# Patient Record
Sex: Male | Born: 1945
Health system: Southern US, Community
[De-identification: ages and names within clinical notes are randomized; demographics above are authoritative.]

## PROBLEM LIST (undated history)

## (undated) DIAGNOSIS — E785 Hyperlipidemia, unspecified: Secondary | ICD-10-CM

## (undated) DIAGNOSIS — I2510 Atherosclerotic heart disease of native coronary artery without angina pectoris: Secondary | ICD-10-CM

## (undated) DIAGNOSIS — K219 Gastro-esophageal reflux disease without esophagitis: Secondary | ICD-10-CM

## (undated) DIAGNOSIS — I10 Essential (primary) hypertension: Secondary | ICD-10-CM

## (undated) HISTORY — PX: CORONARY ANGIOPLASTY: SHX604

## (undated) HISTORY — DX: Atherosclerotic heart disease of native coronary artery without angina pectoris: I25.10

## (undated) HISTORY — DX: Gastro-esophageal reflux disease without esophagitis: K21.9

## (undated) HISTORY — PX: TRANSURETHRAL RESECTION OF PROSTATE: SHX73

## (undated) HISTORY — DX: Hyperlipidemia, unspecified: E78.5

## (undated) HISTORY — DX: Essential (primary) hypertension: I10

---

## 2013-10-20 ENCOUNTER — Ambulatory Visit: Payer: Self-pay | Admitting: Interventional Cardiology

## 2013-10-21 ENCOUNTER — Encounter: Payer: Self-pay | Admitting: Cardiology

## 2013-10-21 ENCOUNTER — Encounter: Payer: Self-pay | Admitting: Interventional Cardiology

## 2013-10-21 ENCOUNTER — Ambulatory Visit (INDEPENDENT_AMBULATORY_CARE_PROVIDER_SITE_OTHER): Payer: BC Managed Care – PPO | Admitting: Interventional Cardiology

## 2013-10-21 VITALS — BP 154/68 | HR 64 | Ht 67.0 in | Wt 169.0 lb

## 2013-10-21 DIAGNOSIS — IMO0001 Reserved for inherently not codable concepts without codable children: Secondary | ICD-10-CM

## 2013-10-21 DIAGNOSIS — I10 Essential (primary) hypertension: Secondary | ICD-10-CM

## 2013-10-21 DIAGNOSIS — I2510 Atherosclerotic heart disease of native coronary artery without angina pectoris: Secondary | ICD-10-CM

## 2013-10-21 DIAGNOSIS — N184 Chronic kidney disease, stage 4 (severe): Secondary | ICD-10-CM | POA: Insufficient documentation

## 2013-10-21 DIAGNOSIS — N189 Chronic kidney disease, unspecified: Secondary | ICD-10-CM | POA: Insufficient documentation

## 2013-10-21 DIAGNOSIS — I25118 Atherosclerotic heart disease of native coronary artery with other forms of angina pectoris: Secondary | ICD-10-CM | POA: Insufficient documentation

## 2013-10-21 DIAGNOSIS — M791 Myalgia, unspecified site: Secondary | ICD-10-CM

## 2013-10-21 DIAGNOSIS — R252 Cramp and spasm: Secondary | ICD-10-CM

## 2013-10-21 DIAGNOSIS — I25119 Atherosclerotic heart disease of native coronary artery with unspecified angina pectoris: Secondary | ICD-10-CM | POA: Insufficient documentation

## 2013-10-21 MED ORDER — AMLODIPINE BESYLATE 10 MG PO TABS: ORAL_TABLET | ORAL | Status: DC

## 2013-10-21 MED ORDER — NITROGLYCERIN 0.4 MG SL SUBL: 0.4000 mg | SUBLINGUAL_TABLET | SUBLINGUAL | Status: DC | PRN

## 2013-10-21 MED ORDER — ATORVASTATIN CALCIUM 10 MG PO TABS: 10.0000 mg | ORAL_TABLET | Freq: Every day | ORAL | Status: DC

## 2013-10-21 MED ORDER — ASPIRIN EC 81 MG PO TBEC: 81.0000 mg | DELAYED_RELEASE_TABLET | Freq: Every day | ORAL | Status: DC

## 2013-10-21 MED ORDER — NEBIVOLOL HCL 5 MG PO TABS: 5.0000 mg | ORAL_TABLET | Freq: Every day | ORAL | Status: DC

## 2013-10-21 NOTE — Patient Instructions (Addendum)
Your physician has recommended you make the following change in your medication:   1. Start Aspirin 81 mg daily.   2. Start Atorvastatin 10 mg daily.   3. Stat Nitroglycerin 0.4 mg SL- only use as needed for chest pain.  4.  Increase Amlodipine to 10 mg daily.  Your physician recommends that you return for a FASTING lipid profile and vitamin D on 10/27/13.  Your physician has requested that you regularly monitor and record your blood pressure readings at home. Please use the same machine at the same time of day to check your readings and record them to bring to your follow-up visit. Call if BP are consistently above 150-140's/90's.  Your physician recommends that you schedule a follow-up appointment in: 2 months with Dr. Irish Lack.

## 2013-10-21 NOTE — Progress Notes (Signed)
Patient ID: Joshua Thompson, male   DOB: 08/07/1945, 68 y.o.   MRN: [id number]     Patient ID: Joshua Thompson MRN: [id number] DOB/AGE: 03/27/1946 68 y.o.   Referring Physician Dr. Cheral Bay   Reason for Consultation: HTN  HPI: 68 y/o who has had HTN for about 15 years.  In 2004, he had chest pain and then a stress test which was abnormal and then a DES (Taxus) was placed, while in [place].  He did well after that time.    In 2010, he had leg pain and kidney pain.  He was gaining weight and feeling ill and he was retaining fluid.  His BP was high.  His creatinine had reached 1.5+.  He also had prostatic obstruction leading to the urinary issues.   In 09/2012, he had HTN, urine retention and then he had a catheter placed for his bladder.  TURP was recommended, but it was also noted that his BP was up.  Another coronary lesion was found.  He was in renal failure, Cr 4.5.  He then had two bare stents placed.  Two weeks later, he had a TURP which was unsuccessful.  He had a bladder obstruction.  He had a repeat TURP.   In August 2014, he and his wife  Moved to [place].  He followed up with urology at [facility].  It was noted that there was an irregular surface of the inside of the bladder.  His Cr. Is 3.0.  Potassium was slightly elevated.    He has had gout also. His Hbg in around 13 at baseline.  He had a UTI and was treated with Antibiotics.  He moved to [place] since his family moved here.  No chest pain.  He reports some leg pain after walking long distances along with swelling.  He walks in the mall without problems. BP rarely goes below 140.     Current Outpatient Prescriptions  Medication Sig Dispense Refill  . amLODipine (NORVASC) 5 MG tablet Take 5 mg by mouth daily.      . nebivolol (BYSTOLIC) 5 MG tablet Take 5 mg by mouth daily.       No current facility-administered medications for this visit.   Past Medical History  Diagnosis Date  . Coronary artery disease   .  Hypertension     Family History  Problem Relation Age of Onset  . Hypertension Mother     History   Social History  . Marital Status: Married    Spouse Name: N/A    Number of Children: N/A  . Years of Education: N/A   Occupational History  . Not on file.   Social History Main Topics  . Smoking status: Never Smoker   . Smokeless tobacco: Not on file  . Alcohol Use: No  . Drug Use: Not on file  . Sexual Activity: Not on file   Other Topics Concern  . Not on file   Social History Narrative  . No narrative on file    Past Surgical History  Procedure Laterality Date  . Coronary angioplasty      3 STENTS  . Transurethral resection of prostate      HAD DONE THREE TIMES      (Not in a hospital admission)  Review of systems complete and found to be negative unless listed above .  No nausea, vomiting.  No fever chills, No focal weakness,  No palpitations.  Physical Exam: Filed Vitals:   10/21/13 0831  BP:  154/68  Pulse: 64    Weight: 169 lb (76.658 kg)  Physical exam:  [place]/AT EOMI No JVD, No carotid bruit RRR S1S2  No wheezing Soft. NT, nondistended No edema. No focal motor or sensory deficits Normal affect  Labs:   No results found for this basename: WBC, HGB, HCT, MCV, PLT   No results found for this basename: NA, K, CL, CO2, BUN, CREATININE, CALCIUM, LABALBU, PROT, BILITOT, ALKPHOS, ALT, AST, GLUCOSE,  in the last 168 hours No results found for this basename: CKTOTAL, CKMB, CKMBINDEX, TROPONINI    No results found for this basename: CHOL   No results found for this basename: HDL   No results found for this basename: LDLCALC   No results found for this basename: TRIG   No results found for this basename: CHOLHDL   No results found for this basename: LDLDIRECT      Radiology: EKG: Normal  ASSESSMENT AND PLAN:  CAD:  Several stents were then placed one was a Taxus and 2004. He has 2 bare metal stents from 2014. ReStart aspirin 81 mg daily.   Start atorvastatin 10 mg daily. He has had some leg cramps in the past. Consider co Q 10.  Check vitamin D level.  No angina. Given his kidney disease, I would be hesitant to perform angiography. This would only be done if absolutely necessary.  Hypertension:  Will increase amlodipine to 10 mg daily continue by systolic at 5 mg daily. He has brought  medications from [place].  When he runs out of these medicines, we will have to replace these with medicines from the [place]. He will check his blood pressures at home and keep a record.  Chronic kidney disease: He states his baseline creatinine is now about 3. Avoid nephrotoxins. No NSAIDs. Would not use an ACE inhibitor or ARB.  Signed:   Mina Marble, MD, [facility] 10/21/2013, 8:40 AM

## 2013-10-27 ENCOUNTER — Other Ambulatory Visit (INDEPENDENT_AMBULATORY_CARE_PROVIDER_SITE_OTHER): Payer: BC Managed Care – PPO

## 2013-10-27 DIAGNOSIS — R252 Cramp and spasm: Secondary | ICD-10-CM

## 2013-10-27 DIAGNOSIS — M791 Myalgia, unspecified site: Secondary | ICD-10-CM

## 2013-10-27 DIAGNOSIS — I2510 Atherosclerotic heart disease of native coronary artery without angina pectoris: Secondary | ICD-10-CM

## 2013-10-27 LAB — LIPID PANEL
CHOL/HDL RATIO: 6
Cholesterol: 204 mg/dL — ABNORMAL HIGH (ref 0–200)
HDL: 35.7 mg/dL — AB (ref >39.00)
LDL CALC: 120 mg/dL — AB (ref 0–99)
Triglycerides: 243 mg/dL — ABNORMAL HIGH (ref 0.0–149.0)
VLDL: 48.6 mg/dL — AB (ref 0.0–40.0)

## 2013-10-28 LAB — VITAMIN D 25 HYDROXY (VIT D DEFICIENCY, FRACTURES): Vit D, 25-Hydroxy: 22 ng/mL — ABNORMAL LOW (ref 30–89)

## 2013-10-28 NOTE — Progress Notes (Signed)
Quick Note:  Preliminary report reviewed by triage nurse and sent to MD desk. ______ 

## 2013-11-01 ENCOUNTER — Telehealth: Payer: Self-pay | Admitting: Cardiology

## 2013-11-01 DIAGNOSIS — I2510 Atherosclerotic heart disease of native coronary artery without angina pectoris: Secondary | ICD-10-CM

## 2013-11-01 DIAGNOSIS — IMO0001 Reserved for inherently not codable concepts without codable children: Secondary | ICD-10-CM

## 2013-11-01 DIAGNOSIS — R252 Cramp and spasm: Secondary | ICD-10-CM

## 2013-11-01 MED ORDER — ATORVASTATIN CALCIUM 20 MG PO TABS: 20.0000 mg | ORAL_TABLET | Freq: Every day | ORAL | Status: DC

## 2013-11-01 MED ORDER — VITAMIN D (ERGOCALCIFEROL) 1.25 MG (50000 UNIT) PO CAPS: 50000.0000 [IU] | ORAL_CAPSULE | ORAL | Status: DC

## 2013-11-01 NOTE — Telephone Encounter (Signed)
Pt notified, meds updated and labs ordered.  

## 2013-11-01 NOTE — Telephone Encounter (Signed)
Message copied by Alcario Drought on Mon Nov 01, 2013  1:51 PM ------      Message from: SMART, [place]: Mon Nov 01, 2013  9:48 AM       RF:  CAD (3 stents) - 2 stents placed in 2014, 1 stent in 2004, HTN, age - LDL goal < 70, non-HDL goal < 100      Meds:  Lipitor 10 mg qd recently started by Dr. Irish Lack this month.      Patient has low vitamin D and elevated LDL.  Will given him vitamin D replacement therapy now, and increase atorvastatin dose next month.      Plan:      1.  Start Vitamin D 50,000 units once weekly (given #4 with 1 refill)      2.  Increase atorvastatin to 20 mg next month when current supply of 10 mg runs out.      3.  Recheck lipid panel, hepatic panel, and vitamin D 25-OH level in 8 weeks.      Please notify patient, update meds, and set up labs. Thanks.       ------

## 2013-12-07 ENCOUNTER — Telehealth: Payer: Self-pay | Admitting: Interventional Cardiology

## 2013-12-07 ENCOUNTER — Telehealth: Payer: Self-pay

## 2013-12-07 NOTE — Telephone Encounter (Signed)
Pt was given list by doctor to find out which medication works for his insurance for the [place] was giving problems to patient. Pt came back and would like to choose Carvedilol. Pt is out completley out of medication. Gave some samples but won't last long. Please advise.

## 2013-12-07 NOTE — Telephone Encounter (Signed)
Ok to use carvedilol 6.25 mg BID.  WIll titrate up as needed.

## 2013-12-07 NOTE — Telephone Encounter (Signed)
To Dr. Varanasi, please advise.  

## 2013-12-07 NOTE — Telephone Encounter (Signed)
Ok to switch 

## 2013-12-07 NOTE — Telephone Encounter (Signed)
Noted, see other telephone note.  

## 2013-12-07 NOTE — Telephone Encounter (Signed)
Patient came to office to pick up samples of bystolic 5 mg, gave them to him up front

## 2013-12-08 MED ORDER — CARVEDILOL 6.25 MG PO TABS
6.2500 mg | ORAL_TABLET | Freq: Two times a day (BID) | ORAL | Status: DC
Start: 2013-12-08 — End: 2013-12-20

## 2013-12-08 NOTE — Telephone Encounter (Signed)
Pt notified. Coreg sent in. Bystolic taken off med list.

## 2013-12-20 ENCOUNTER — Ambulatory Visit (INDEPENDENT_AMBULATORY_CARE_PROVIDER_SITE_OTHER): Payer: BC Managed Care – PPO | Admitting: Interventional Cardiology

## 2013-12-20 ENCOUNTER — Encounter: Payer: Self-pay | Admitting: Interventional Cardiology

## 2013-12-20 ENCOUNTER — Telehealth: Payer: Self-pay | Admitting: Interventional Cardiology

## 2013-12-20 VITALS — BP 122/58 | HR 64 | Ht 67.0 in | Wt 168.0 lb

## 2013-12-20 DIAGNOSIS — I10 Essential (primary) hypertension: Secondary | ICD-10-CM

## 2013-12-20 DIAGNOSIS — N189 Chronic kidney disease, unspecified: Secondary | ICD-10-CM

## 2013-12-20 MED ORDER — CARVEDILOL 6.25 MG PO TABS: 6.2500 mg | ORAL_TABLET | Freq: Two times a day (BID) | ORAL | Status: DC

## 2013-12-20 NOTE — Telephone Encounter (Signed)
Spoke with pt at [facility] today and he was switched from [place] to [place]. Form will not be needed.

## 2013-12-20 NOTE — Patient Instructions (Signed)
Your physician recommends that you continue on your current medications as directed. Please refer to the Current Medication list given to you today.  Your physician wants you to follow-up in: 6 months with Dr. Varanasi.  You will receive a reminder letter in the mail two months in advance. If you don't receive a letter, please call our office to schedule the follow-up appointment.  

## 2013-12-20 NOTE — Progress Notes (Signed)
Patient ID: Joshua Thompson, male   DOB: August 05, 1945, 68 y.o.   MRN: [id number] Patient ID: Joshua Thompson, male   DOB: Dec 09, 1945, 68 y.o.   MRN: [id number]     Patient ID: Joshua Thompson MRN: [id number] DOB/AGE: [birth year] 28 y.o.   Referring Physician Dr. Christy Sartorius   Reason for Consultation: HTN  HPI: 68 y/o who has had HTN for about 15 years.  In 2004, he had chest pain and then a stress test which was abnormal and then a DES (Taxus) was placed, while in [place].  He did well after that time.    In 2010, he had leg pain and kidney pain.  He was gaining weight and feeling ill and he was retaining fluid.  His BP was high.  His creatinine had reached 1.5+.  He also had prostatic obstruction leading to the urinary issues.   In 09/2012, he had HTN, urine retention and then he had a catheter placed for his bladder.  TURP was recommended, but it was also noted that his BP was up.  Another coronary lesion was found.  He was in renal failure, Cr 4.5.  He then had two bare stents placed.  Two weeks later, he had a TURP which was unsuccessful.  He had a bladder obstruction.  He had a repeat TURP.   In August 2014, he and his wife  Moved to [place].  He followed up with urology at [facility].  It was noted that there was an irregularity of the surface of the inside of the bladder.  His Cr. Is 3.0.  Potassium was slightly elevated.    He has had gout also. His Hbg in around 13 at baseline.  He had a UTI and was treated with Antibiotics.  He moved to [place] since his family moved here.  No chest pain.  He reports some leg pain after walking long distances along with swelling.  He walks in the mall without problems. BP now typically in the 130s since increasing Norvasc at last visit.  Only taking Coreg once a day.   Current Outpatient Prescriptions  Medication Sig Dispense Refill  . amLODipine (NORVASC) 10 MG tablet 1 table daily.  30 tablet  6  . aspirin EC 81 MG tablet Take 1 tablet (81  mg total) by mouth daily.      Marland Kitchen atorvastatin (LIPITOR) 20 MG tablet Take 1 tablet (20 mg total) by mouth daily.  30 tablet  6  . carvedilol (COREG) 6.25 MG tablet Take 1 tablet (6.25 mg total) by mouth 2 (two) times daily.  60 tablet  6  . nitroGLYCERIN (NITROSTAT) 0.4 MG SL tablet Place 1 tablet (0.4 mg total) under the tongue every 5 (five) minutes as needed for chest pain.  25 tablet  5  . Vitamin D, Ergocalciferol, (DRISDOL) 50000 UNITS CAPS capsule Take 1 capsule (50,000 Units total) by mouth every 7 (seven) days.  4 capsule  1   No current facility-administered medications for this visit.   Past Medical History  Diagnosis Date  . Coronary artery disease   . Hypertension     Family History  Problem Relation Age of Onset  . Hypertension Mother     History   Social History  . Marital Status: Married    Spouse Name: N/A    Number of Children: N/A  . Years of Education: N/A   Occupational History  . Not on file.   Social History Main Topics  . Smoking status: Never Smoker   .  Smokeless tobacco: Not on file  . Alcohol Use: No  . Drug Use: Not on file  . Sexual Activity: Not on file   Other Topics Concern  . Not on file   Social History Narrative  . No narrative on file    Past Surgical History  Procedure Laterality Date  . Coronary angioplasty      3 STENTS  . Transurethral resection of prostate      HAD DONE THREE TIMES      (Not in a hospital admission)  Review of systems complete and found to be negative unless listed above .  No nausea, vomiting.  No fever chills, No focal weakness,  No palpitations.  Physical Exam: Filed Vitals:   12/20/13 0839  BP: 122/58  Pulse: 64    Weight: 168 lb (76.204 kg)  Physical exam:  [place]/AT EOMI No JVD, No carotid bruit RRR S1S2  No wheezing Soft. NT, nondistended No edema. No focal motor or sensory deficits Normal affect  Labs:   No results found for this basename: WBC,  HGB,  HCT,  MCV,  PLT   No results  found for this basename: NA, K, CL, CO2, BUN, CREATININE, CALCIUM, LABALBU, PROT, BILITOT, ALKPHOS, ALT, AST, GLUCOSE,  in the last 168 hours No results found for this basename: CKTOTAL,  CKMB,  CKMBINDEX,  TROPONINI    Lab Results  Component Value Date   CHOL 204* 10/27/2013   Lab Results  Component Value Date   HDL 35.70* 10/27/2013   Lab Results  Component Value Date   LDLCALC 120* 10/27/2013   Lab Results  Component Value Date   TRIG 243.0* 10/27/2013   Lab Results  Component Value Date   CHOLHDL 6 10/27/2013   No results found for this basename: LDLDIRECT      Radiology: EKG: Normal  ASSESSMENT AND PLAN:  CAD:  Several stents were then placed one was a Taxus and 2004. He has 2 bare metal stents from 2014. ReStarted aspirin 81 mg daily.  Started atorvastatin 10 mg daily. He has had some leg cramps in the past. Consider co Q 10.  Check vitamin D level.  No angina. Given his kidney disease, I would be hesitant to perform angiography. This would only be done if absolutely necessary.  Hypertension:  Will increase amlodipine to 10 mg daily continue by systolic at 5 mg daily. He has brought  medications from [place].  When he runs out of these medicines, we will have to replace these with medicines from the [place]. He will check his blood pressures at home and keep a record. Home BP is typically in the [id number] range systolic.  Coreg increased to BID.  Would like to see systolics below [id number].  Chronic kidney disease: He states his baseline creatinine is now about 3. Avoid nephrotoxins. No NSAIDs. Would not use an ACE inhibitor or ARB.  Ear fullness may be related to seasonal allergies.  He is fairly new to the area.   Signed:   Mina Marble, MD, [facility] 12/20/2013, 8:54 AM

## 2013-12-20 NOTE — Telephone Encounter (Signed)
Pt is wanting to know about an application he filled out to help with medication.

## 2014-01-03 ENCOUNTER — Other Ambulatory Visit (INDEPENDENT_AMBULATORY_CARE_PROVIDER_SITE_OTHER): Payer: BC Managed Care – PPO

## 2014-01-03 DIAGNOSIS — IMO0001 Reserved for inherently not codable concepts without codable children: Secondary | ICD-10-CM

## 2014-01-03 DIAGNOSIS — R252 Cramp and spasm: Secondary | ICD-10-CM

## 2014-01-03 DIAGNOSIS — I2510 Atherosclerotic heart disease of native coronary artery without angina pectoris: Secondary | ICD-10-CM

## 2014-01-03 LAB — LIPID PANEL
CHOLESTEROL: 113 mg/dL (ref 0–200)
HDL: 39.6 mg/dL (ref >39.00)
LDL CALC: 52 mg/dL (ref 0–99)
NonHDL: 73.4
Total CHOL/HDL Ratio: 3
Triglycerides: 106 mg/dL (ref 0.0–149.0)
VLDL: 21.2 mg/dL (ref 0.0–40.0)

## 2014-01-03 LAB — HEPATIC FUNCTION PANEL
ALK PHOS: 65 U/L (ref 39–117)
ALT: 19 U/L (ref 0–53)
AST: 22 U/L (ref 0–37)
Albumin: 4 g/dL (ref 3.5–5.2)
BILIRUBIN DIRECT: 0.1 mg/dL (ref 0.0–0.3)
BILIRUBIN TOTAL: 0.9 mg/dL (ref 0.2–1.2)
Total Protein: 7 g/dL (ref 6.0–8.3)

## 2014-01-04 LAB — VITAMIN D 25 HYDROXY (VIT D DEFICIENCY, FRACTURES): Vit D, 25-Hydroxy: 44 ng/mL (ref 30–89)

## 2014-01-07 ENCOUNTER — Other Ambulatory Visit: Payer: Self-pay | Admitting: Cardiology

## 2014-01-07 DIAGNOSIS — I2510 Atherosclerotic heart disease of native coronary artery without angina pectoris: Secondary | ICD-10-CM

## 2014-05-24 ENCOUNTER — Other Ambulatory Visit: Payer: Self-pay | Admitting: *Deleted

## 2014-05-24 MED ORDER — AMLODIPINE BESYLATE 10 MG PO TABS: 10.0000 mg | ORAL_TABLET | Freq: Every day | ORAL | Status: DC

## 2014-06-13 ENCOUNTER — Other Ambulatory Visit: Payer: BC Managed Care – PPO

## 2014-06-15 ENCOUNTER — Other Ambulatory Visit (INDEPENDENT_AMBULATORY_CARE_PROVIDER_SITE_OTHER): Payer: BC Managed Care – PPO | Admitting: *Deleted

## 2014-06-15 DIAGNOSIS — I2510 Atherosclerotic heart disease of native coronary artery without angina pectoris: Secondary | ICD-10-CM

## 2014-06-15 LAB — HEPATIC FUNCTION PANEL
ALT: 21 U/L (ref 0–53)
AST: 21 U/L (ref 0–37)
Albumin: 4.1 g/dL (ref 3.5–5.2)
Alkaline Phosphatase: 75 U/L (ref 39–117)
Bilirubin, Direct: 0.1 mg/dL (ref 0.0–0.3)
Total Bilirubin: 0.6 mg/dL (ref 0.2–1.2)
Total Protein: 7.2 g/dL (ref 6.0–8.3)

## 2014-06-15 LAB — LIPID PANEL
CHOL/HDL RATIO: 3
Cholesterol: 149 mg/dL (ref 0–200)
HDL: 49.9 mg/dL (ref >39.00)
LDL CALC: 85 mg/dL (ref 0–99)
NonHDL: 99.1
Triglycerides: 73 mg/dL (ref 0.0–149.0)
VLDL: 14.6 mg/dL (ref 0.0–40.0)

## 2014-07-06 ENCOUNTER — Ambulatory Visit (INDEPENDENT_AMBULATORY_CARE_PROVIDER_SITE_OTHER): Payer: 59 | Admitting: Interventional Cardiology

## 2014-07-06 ENCOUNTER — Encounter: Payer: Self-pay | Admitting: Interventional Cardiology

## 2014-07-06 VITALS — BP 146/88 | HR 88 | Ht 67.0 in | Wt 164.0 lb

## 2014-07-06 DIAGNOSIS — N183 Chronic kidney disease, stage 3 unspecified: Secondary | ICD-10-CM

## 2014-07-06 DIAGNOSIS — I2510 Atherosclerotic heart disease of native coronary artery without angina pectoris: Secondary | ICD-10-CM

## 2014-07-06 DIAGNOSIS — I10 Essential (primary) hypertension: Secondary | ICD-10-CM

## 2014-07-06 MED ORDER — CARVEDILOL 12.5 MG PO TABS: 12.5000 mg | ORAL_TABLET | Freq: Two times a day (BID) | ORAL | Status: DC

## 2014-07-06 NOTE — Progress Notes (Signed)
Patient ID: Joshua Thompson, male   DOB: 1945/10/13, 69 y.o.   MRN: [id number]     Patient ID: Joshua Thompson MRN: [id number] DOB/AGE: 1945-06-21 69 y.o.   Referring Physician Dr. Christy Sartorius   Reason for Consultation: HTN  HPI: 69 y/o who has had HTN for about 15 years.  In 2004, he had chest pain and then a stress test which was abnormal and then a DES (Taxus) was placed, while in [place].  He did well after that time.    In 2010, he had leg pain and kidney pain.  He was gaining weight and feeling ill and he was retaining fluid.  His BP was high.  His creatinine had reached 1.5+.  He also had prostatic obstruction leading to the urinary issues.   In 09/2012, he had HTN, urine retention and then he had a catheter placed for his bladder.  TURP was recommended, but it was also noted that his BP was up.  Another coronary lesion was found.  He was in renal failure, Cr 4.5.  He then had two bare stents placed.  Two weeks later, he had a TURP which was unsuccessful.  He had a bladder obstruction.  He had a repeat TURP.   In August 2014, he and his wife  Moved to [place].  He followed up with urology at [facility].  It was noted that there was an irregularity of the surface of the inside of the bladder.  His Cr. Is 3.0.  Potassium was slightly elevated.    He has had gout also. His Hbg in around 13 at baseline.    BP has been in the A999333 systolic range. This is high for him. He has had some mild chest discomfort. This is worse with palpation. He feels that his musculoskeletal. He thinks it is different from his prior angina. He is not requesting any further evaluation of this at this time.  He also mentions that his wife's blood pressure is been high. He has a son who has hypertrophic obstructive cardiomyopathy. He is being evaluated by EP for possible defibrillator.   Current Outpatient Prescriptions  Medication Sig Dispense Refill  . amLODipine (NORVASC) 10 MG tablet Take 1 tablet  (10 mg total) by mouth daily. 30 tablet 1  . aspirin EC 81 MG tablet Take 1 tablet (81 mg total) by mouth daily.    Marland Kitchen atorvastatin (LIPITOR) 20 MG tablet Take 1 tablet (20 mg total) by mouth daily. 30 tablet 6  . carvedilol (COREG) 6.25 MG tablet Take 1 tablet (6.25 mg total) by mouth 2 (two) times daily. 60 tablet 6  . nitroGLYCERIN (NITROSTAT) 0.4 MG SL tablet Place 1 tablet (0.4 mg total) under the tongue every 5 (five) minutes as needed for chest pain. 25 tablet 5   No current facility-administered medications for this visit.   Past Medical History  Diagnosis Date  . Coronary artery disease   . Hypertension     Family History  Problem Relation Age of Onset  . Hypertension Mother     History   Social History  . Marital Status: Married    Spouse Name: N/A    Number of Children: N/A  . Years of Education: N/A   Occupational History  . Not on file.   Social History Main Topics  . Smoking status: Never Smoker   . Smokeless tobacco: Not on file  . Alcohol Use: No  . Drug Use: Not on file  . Sexual Activity: Not on file  Other Topics Concern  . Not on file   Social History Narrative    Past Surgical History  Procedure Laterality Date  . Coronary angioplasty      3 STENTS  . Transurethral resection of prostate      HAD DONE THREE TIMES      (Not in a hospital admission)  Review of systems complete and found to be negative unless listed above .  No nausea, vomiting.  No fever chills, No focal weakness,  No palpitations.  Physical Exam: Filed Vitals:   07/06/14 1513  BP: 146/88  Pulse: 88    Weight: 164 lb (74.39 kg)  Physical exam: No apparent distress [place]/AT EOMI No JVD, No carotid bruit RRR S1S2  No wheezing Soft. NT, nondistended No edema. No focal motor or sensory deficits Normal affect  Labs:   No results found for: WBC No results for input(s): NA, K, CL, CO2, BUN, CREATININE, CALCIUM, PROT, BILITOT, ALKPHOS, ALT, AST, GLUCOSE in the last 168  hours.  Invalid input(s): LABALBU No results found for: CKTOTAL  Lab Results  Component Value Date   CHOL 149 06/15/2014   CHOL 113 01/03/2014   CHOL 204* 10/27/2013   Lab Results  Component Value Date   HDL 49.90 06/15/2014   HDL 39.60 01/03/2014   HDL 35.70* 10/27/2013   Lab Results  Component Value Date   LDLCALC 85 06/15/2014   LDLCALC 52 01/03/2014   LDLCALC 120* 10/27/2013   Lab Results  Component Value Date   TRIG 73.0 06/15/2014   TRIG 106.0 01/03/2014   TRIG 243.0* 10/27/2013   Lab Results  Component Value Date   CHOLHDL 3 06/15/2014   CHOLHDL 3 01/03/2014   CHOLHDL 6 10/27/2013   No results found for: LDLDIRECT    Radiology:   ASSESSMENT AND PLAN:  CAD/chest pain:  Several stents were then placed one was a Taxus and 2004. He has 2 bare metal stents from 2014. ReStarted aspirin 81 mg daily.  Started atorvastatin 10 mg daily. He has had some leg cramps in the past. Consider co Q 10.  Check vitamin D level.  Not like his prior angina. Given his kidney disease, I would be hesitant to perform angiography. This would only be done if absolutely necessary.  Pain in the chest reproducible with palpation of the chest.  I think this is chest wall pain.  THis is different from his prior angina.  Hypertension: Increased amlodipine to 10 mg daily.  He will check his blood pressures at home and keep a record. Home BP is typically in the A999333 range systolic.   Increase Coreg to 12.5 BID.  Would like to see systolics below [id number].  Pharmacy also spoke to the patient regarding enrolling in a hypertension project to see if monitoring his blood pressure with phone calls and adjustments would be helpful.  Chronic kidney disease: He states his baseline creatinine is now about 3. Avoid nephrotoxins. No NSAIDs. Would not use an ACE inhibitor or ARB.  Want to keep strict control of his BP to preserve renal function.     Signed:   Mina Marble, MD, [facility] 07/06/2014, 3:42 PM

## 2014-07-06 NOTE — Patient Instructions (Signed)
Your physician has recommended you make the following change in your medication:  1) START taking Carvedilol 12.5mg  twice daily  Your physician recommends that you schedule a follow-up appointment in: 2 months with Dr. Irish Lack.

## 2014-07-12 ENCOUNTER — Ambulatory Visit: Payer: 59 | Admitting: Pharmacist

## 2014-07-14 ENCOUNTER — Encounter: Payer: Self-pay | Admitting: Pharmacist Clinician (PhC)/ Clinical Pharmacy Specialist

## 2014-07-14 ENCOUNTER — Ambulatory Visit (INDEPENDENT_AMBULATORY_CARE_PROVIDER_SITE_OTHER): Payer: 59 | Admitting: Pharmacist Clinician (PhC)/ Clinical Pharmacy Specialist

## 2014-07-14 VITALS — BP 152/84 | Ht 67.0 in | Wt 164.0 lb

## 2014-07-14 DIAGNOSIS — I10 Essential (primary) hypertension: Secondary | ICD-10-CM

## 2014-07-14 LAB — BASIC METABOLIC PANEL
BUN: 36 mg/dL — AB (ref 6–23)
CALCIUM: 9.3 mg/dL (ref 8.4–10.5)
CHLORIDE: 107 meq/L (ref 96–112)
CO2: 22 mEq/L (ref 19–32)
Creatinine, Ser: 3.01 mg/dL — ABNORMAL HIGH (ref 0.40–1.50)
GFR: 22.21 mL/min — ABNORMAL LOW (ref >60.00)
GLUCOSE: 85 mg/dL (ref 70–99)
Potassium: 5.8 mEq/L — ABNORMAL HIGH (ref 3.5–5.1)
Sodium: 138 mEq/L (ref 135–145)

## 2014-07-14 MED ORDER — HYDRALAZINE HCL 25 MG PO TABS: 25.0000 mg | ORAL_TABLET | Freq: Two times a day (BID) | ORAL | Status: DC

## 2014-07-14 NOTE — Progress Notes (Signed)
Pharmacist Hypertension Clinic - Resident Research Study  S/O: Joshua Thompson is a 69 y.o. male presenting to clinic today for initial study visit and referred to hypertension clinic by Dr. Irish Lack.  Pt does have history of kidney disease.  He has been taking amlodipine since before coming to [facility] and it was increased from 5 mg daily to 10 mg daily in May 2015.  He has had no complaints or problems with this.  At his most recent visit with Dr. Irish Lack his carvedilol was doubled from 6.25 to 12.5 mg twice daily.  He reports no problems with compliance or side effects from the medications  Current HTN meds: Amlodipine 10 mg daily Carvedilol 12.5 mg twice daily  NOTE : AVOID ACEI and ARB due to kidney disease  FH:  Mother had hypertension, son has hypertrophic obstructive cardiomyopath  SH:  Does not drink alcohol or use tobacco  Wt Readings from Last 3 Encounters:  07/14/14 164 lb (74.39 kg)  07/06/14 164 lb (74.39 kg)  12/20/13 168 lb (76.204 kg)   BP Readings from Last 3 Encounters:  07/14/14 152/84  07/06/14 146/88  12/20/13 122/58   Pulse Readings from Last 3 Encounters:  07/06/14 88  12/20/13 64  10/21/13 64    Renal function: CrCl cannot be calculated (Patient has no serum creatinine result on file.).  Outpatient Encounter Prescriptions as of 07/14/2014  Medication Sig  . amLODipine (NORVASC) 10 MG tablet Take 1 tablet (10 mg total) by mouth daily.  Marland Kitchen aspirin EC 81 MG tablet Take 1 tablet (81 mg total) by mouth daily.  Marland Kitchen atorvastatin (LIPITOR) 20 MG tablet Take 1 tablet (20 mg total) by mouth daily.  . carvedilol (COREG) 12.5 MG tablet Take 1 tablet (12.5 mg total) by mouth 2 (two) times daily.  . nitroGLYCERIN (NITROSTAT) 0.4 MG SL tablet Place 1 tablet (0.4 mg total) under the tongue every 5 (five) minutes as needed for chest pain.    Allergies: Allergies  Allergen Reactions  . Ampicillin Rash    A/P: Pt has qualified for study enrollment and has  signed informed consent. Pt was randomized to study group: telephonic visits  Pt is not at BP goal of < 130/90 mmHg. (MD preferred goal).  Will add hydralazine 25 mg twice daily.   Pt knows to call if develops any side effects/problems with the medication.

## 2014-07-15 ENCOUNTER — Telehealth: Payer: Self-pay | Admitting: *Deleted

## 2014-07-15 DIAGNOSIS — E875 Hyperkalemia: Secondary | ICD-10-CM

## 2014-07-15 DIAGNOSIS — N183 Chronic kidney disease, stage 3 unspecified: Secondary | ICD-10-CM

## 2014-07-15 NOTE — Telephone Encounter (Signed)
-----   Message from Jettie Booze, MD sent at 07/14/2014  5:54 PM EST ----- Cr at baseline.  Potassium is high.  Avoid potassium rich foods.  Drink plenty of water and recheck in a week.  WOuld forward this result to his nephrologist.

## 2014-07-15 NOTE — Telephone Encounter (Signed)
Spoke with pt about labs and recommendations per Dr. Irish Lack. Informed pt to avoid K+ rich foods and to drink plenty of water. Labs scheduled for 07/21/14. Pt verbalized understanding and in agreement with this plan.

## 2014-07-21 ENCOUNTER — Other Ambulatory Visit (INDEPENDENT_AMBULATORY_CARE_PROVIDER_SITE_OTHER): Payer: 59 | Admitting: *Deleted

## 2014-07-21 DIAGNOSIS — N183 Chronic kidney disease, stage 3 unspecified: Secondary | ICD-10-CM

## 2014-07-21 DIAGNOSIS — E875 Hyperkalemia: Secondary | ICD-10-CM

## 2014-07-21 LAB — BASIC METABOLIC PANEL
BUN: 38 mg/dL — AB (ref 6–23)
CHLORIDE: 109 meq/L (ref 96–112)
CO2: 23 mEq/L (ref 19–32)
Calcium: 9.1 mg/dL (ref 8.4–10.5)
Creatinine, Ser: 3.11 mg/dL — ABNORMAL HIGH (ref 0.40–1.50)
GFR: 21.39 mL/min — ABNORMAL LOW (ref >60.00)
Glucose, Bld: 90 mg/dL (ref 70–99)
POTASSIUM: 5.2 meq/L — AB (ref 3.5–5.1)
Sodium: 138 mEq/L (ref 135–145)

## 2014-07-21 NOTE — Addendum Note (Signed)
Addended by: Eulis Foster on: 07/21/2014 09:18 AM   Modules accepted: Orders

## 2014-08-10 ENCOUNTER — Other Ambulatory Visit: Payer: Self-pay

## 2014-08-10 MED ORDER — AMLODIPINE BESYLATE 10 MG PO TABS: 10.0000 mg | ORAL_TABLET | Freq: Every day | ORAL | Status: DC

## 2014-08-12 ENCOUNTER — Telehealth: Payer: Self-pay | Admitting: Pharmacist

## 2014-08-12 DIAGNOSIS — I10 Essential (primary) hypertension: Secondary | ICD-10-CM

## 2014-08-12 MED ORDER — HYDRALAZINE HCL 25 MG PO TABS: 25.0000 mg | ORAL_TABLET | Freq: Three times a day (TID) | ORAL | Status: DC

## 2014-08-12 NOTE — Telephone Encounter (Addendum)
Pharmacist Hypertension Clinic - Resident Research Study  S/O: Joshua Thompson is a 69 y.o. male.  Calling pt today for 4wk HTN study f/u visit, referred to hypertension clinic by Dr. Irish Lack.  Pt has history of kidney disease.  He has been taking amlodipine since before coming to [facility] and it was increased from 5 mg daily to 10 mg daily in May 2015.  He has had no complaints or problems with this.  His carvedilol was doubled from 6.25mg  to 12.5mg  twice daily in Jan 2016.  He was prescribed hydralazine 25mg  twice daily in Jan 2016 (1 month ago), but he was just able to fill it yesterday and began taking this morning.  He reports no problems with compliance or side effects from the medications.  Current HTN meds: Amlodipine 10mg  daily Carvedilol 12.5mg  twice daily Hydralazine 25mg  twice daily (just started taking this morning)  NOTE: AVOID ACEi/ARB due to kidney disease documented in chart, but ACEi/ARB is actually INDICATED in patients w/ CKD  FH:  Mother had hypertension, son has hypertrophic obstructive cardiomyopath  SH:  Does not drink alcohol or use tobacco  Wt Readings from Last 3 Encounters:  07/14/14 164 lb (74.39 kg)  07/06/14 164 lb (74.39 kg)  12/20/13 168 lb (76.204 kg)   BP Readings from Last 3 Encounters:  08/12/14 165/84  07/14/14 152/84  07/06/14 146/88   Pulse Readings from Last 3 Encounters:  07/06/14 88  12/20/13 64  10/21/13 64    Renal function: CrCl ~25 mL/min, K+ remains elevated 5.8>>5.2 (pt has nephrologist who is monitoring and addressing)   Outpatient Encounter Prescriptions as of 08/12/2014  Medication Sig  . amLODipine (NORVASC) 10 MG tablet Take 1 tablet (10 mg total) by mouth daily.  Marland Kitchen aspirin EC 81 MG tablet Take 1 tablet (81 mg total) by mouth daily.  Marland Kitchen atorvastatin (LIPITOR) 20 MG tablet Take 1 tablet (20 mg total) by mouth daily.  . carvedilol (COREG) 12.5 MG tablet Take 1 tablet (12.5 mg total) by mouth 2 (two) times daily.  .  hydrALAZINE (APRESOLINE) 25 MG tablet Take 1 tablet (25 mg total) by mouth 3 (three) times daily.  . nitroGLYCERIN (NITROSTAT) 0.4 MG SL tablet Place 1 tablet (0.4 mg total) under the tongue every 5 (five) minutes as needed for chest pain.  . [DISCONTINUED] hydrALAZINE (APRESOLINE) 25 MG tablet Take 1 tablet (25 mg total) by mouth 2 (two) times daily.    Allergies: Allergies  Allergen Reactions  . Ampicillin Rash    A/P: Pt has qualified for study enrollment and has signed informed consent. Pt was randomized to study group: Home BP monitoring w/ telephone f/u  Pt is not at BP goal of < 130/90 mmHg (MD preferred goal).  Will change hydralazine 25mg  from BID to TID.   Pt knows to call if develops any side effects/problems with the medication.  Drucie Opitz, PharmD Clinical Pharmacy Resident

## 2014-09-07 ENCOUNTER — Other Ambulatory Visit: Payer: Self-pay | Admitting: *Deleted

## 2014-09-07 MED ORDER — ATORVASTATIN CALCIUM 20 MG PO TABS: 20.0000 mg | ORAL_TABLET | Freq: Every day | ORAL | Status: DC

## 2014-09-14 ENCOUNTER — Other Ambulatory Visit: Payer: Self-pay | Admitting: *Deleted

## 2014-09-14 ENCOUNTER — Encounter: Payer: Self-pay | Admitting: Interventional Cardiology

## 2014-09-14 ENCOUNTER — Ambulatory Visit (INDEPENDENT_AMBULATORY_CARE_PROVIDER_SITE_OTHER): Payer: 59 | Admitting: Interventional Cardiology

## 2014-09-14 VITALS — BP 134/72 | HR 65 | Ht 66.0 in | Wt 163.0 lb

## 2014-09-14 DIAGNOSIS — N183 Chronic kidney disease, stage 3 unspecified: Secondary | ICD-10-CM

## 2014-09-14 DIAGNOSIS — I2510 Atherosclerotic heart disease of native coronary artery without angina pectoris: Secondary | ICD-10-CM

## 2014-09-14 DIAGNOSIS — I10 Essential (primary) hypertension: Secondary | ICD-10-CM | POA: Diagnosis not present

## 2014-09-14 MED ORDER — HYDRALAZINE HCL 25 MG PO TABS: 25.0000 mg | ORAL_TABLET | Freq: Three times a day (TID) | ORAL | Status: DC

## 2014-09-14 NOTE — Patient Instructions (Signed)
**Note De-Identified Joshua Thompson Obfuscation** Your physician recommends that you continue on your current medications as directed. Please refer to the Current Medication list given to you today.  Your physician recommends that you schedule a follow-up appointment in: to be determined by Dr Irish Lack .

## 2014-09-14 NOTE — Progress Notes (Signed)
Patient ID: Joshua Thompson, male   DOB: Mar 24, 1946, 69 y.o.   MRN: [id number]     Patient ID: Joshua Thompson MRN: [id number] DOB/AGE: Dec 05, 1945 69 y.o.   Referring Physician Dr. Ashby Dawes   Reason for Consultation: HTN  HPI: 69 y/o who has had HTN for about 15 years.  In 2004, he had chest pain and then a stress test which was abnormal and then a DES (Taxus) was placed, while in [place].  He did well after that time.    In 2010, he had leg pain and kidney pain.  He was gaining weight and feeling ill and he was retaining fluid.  His BP was high.  His creatinine had reached 1.5+.  He also had prostatic obstruction leading to the urinary issues.   In 09/2012, he had HTN, urine retention and then he had a catheter placed for his bladder.  TURP was recommended, but it was also noted that his BP was up.  Another coronary lesion was found.  He was in renal failure, Cr 4.5.  He then had two bare stents placed.  Two weeks later, he had a TURP which was unsuccessful.  He had a bladder obstruction.  He had a repeat TURP.   In August 2014, he and his wife  Moved to [place].  He followed up with urology at [facility].  It was noted that there was an irregularity of the surface of the inside of the bladder.  His Cr. Is 3.0.  Potassium was slightly elevated.    He has had gout also. His Hbg in around 13 at baseline.  He had a UTI and was treated with Antibiotics.  He moved to [place] since his family moved here.  No chest pain.  He reports some leg pain after walking long distances along with swelling.  He walks in the mall without problems. BP now typically in the 130s in the morning since being in the PharmD HTN project.  Evening readings are in the 160s.  He never increased Hydralazone to TID as instructed in 2/16.   Current Outpatient Prescriptions  Medication Sig Dispense Refill  . amLODipine (NORVASC) 10 MG tablet Take 1 tablet (10 mg total) by mouth daily. 30 tablet 6  . aspirin EC 81 MG  tablet Take 1 tablet (81 mg total) by mouth daily.    Marland Kitchen atorvastatin (LIPITOR) 20 MG tablet Take 1 tablet (20 mg total) by mouth daily. 30 tablet 3  . carvedilol (COREG) 12.5 MG tablet Take 1 tablet (12.5 mg total) by mouth 2 (two) times daily. 60 tablet 6  . hydrALAZINE (APRESOLINE) 25 MG tablet Take 1 tablet (25 mg total) by mouth 3 (three) times daily. 90 tablet 3  . nebivolol (BYSTOLIC) 5 MG tablet Take 5 mg by mouth daily.    . nitroGLYCERIN (NITROSTAT) 0.4 MG SL tablet Place 1 tablet (0.4 mg total) under the tongue every 5 (five) minutes as needed for chest pain. 25 tablet 5   No current facility-administered medications for this visit.   Past Medical History  Diagnosis Date  . Coronary artery disease   . Hypertension     Family History  Problem Relation Age of Onset  . Hypertension Mother     History   Social History  . Marital Status: Married    Spouse Name: N/A  . Number of Children: N/A  . Years of Education: N/A   Occupational History  . Not on file.   Social History Main Topics  .  Smoking status: Never Smoker   . Smokeless tobacco: Not on file  . Alcohol Use: No  . Drug Use: Not on file  . Sexual Activity: Not on file   Other Topics Concern  . Not on file   Social History Narrative    Past Surgical History  Procedure Laterality Date  . Coronary angioplasty      3 STENTS  . Transurethral resection of prostate      HAD DONE THREE TIMES      (Not in a hospital admission)  Review of systems complete and found to be negative unless listed above .  No nausea, vomiting.  No fever chills, No focal weakness,  No palpitations.  Physical Exam: Filed Vitals:   09/14/14 0936  BP: 134/72  Pulse: 65    Weight: 163 lb (73.936 kg)  Physical exam:  [place]/AT EOMI No JVD, No carotid bruit RRR S1S2  No wheezing Soft. NT, nondistended No edema. No focal motor or sensory deficits Normal affect  Labs:   No results found for: WBC No results for input(s): NA,  K, CL, CO2, BUN, CREATININE, CALCIUM, PROT, BILITOT, ALKPHOS, ALT, AST, GLUCOSE in the last 168 hours.  Invalid input(s): LABALBU No results found for: CKTOTAL  Lab Results  Component Value Date   CHOL 149 06/15/2014   CHOL 113 01/03/2014   CHOL 204* 10/27/2013   Lab Results  Component Value Date   HDL 49.90 06/15/2014   HDL 39.60 01/03/2014   HDL 35.70* 10/27/2013   Lab Results  Component Value Date   LDLCALC 85 06/15/2014   LDLCALC 52 01/03/2014   LDLCALC 120* 10/27/2013   Lab Results  Component Value Date   TRIG 73.0 06/15/2014   TRIG 106.0 01/03/2014   TRIG 243.0* 10/27/2013   Lab Results  Component Value Date   CHOLHDL 3 06/15/2014   CHOLHDL 3 01/03/2014   CHOLHDL 6 10/27/2013   No results found for: LDLDIRECT    Radiology: EKG: Normal  ASSESSMENT AND PLAN:  CAD:  Several stents were then placed one was a Taxus and 2004. He has 2 bare metal stents from 2014. ReStarted aspirin 81 mg daily.  Started atorvastatin 10 mg daily. He has had some leg cramps in the past. Consider co Q 10.  Normal vitamin D level in 7/15; increased from 5/15.  No angina. Given his kidney disease, I would be hesitant to perform angiography. This would only be done if absolutely necessary.  Hypertension:  Following with pharmacy hypertension clinic. Blood pressures have been well controlled in the morning but high readings in the evening.  Would like to see systolics below [id number].  Give Rx for Hydralazine 25 mg TID.  He was only taking it BID, although he was instructed to take it TID in 2/16. He thought he would run out of the medicine. Patient is also taking both by systolic and carvedilol at this point. I've spoken with the pharmacy team in regards to the hypertension trial. They will contact him later today to straighten out his beta blocker. The patient feels that the by systolic is working better for him than carvedilol was.  Chronic kidney disease: His baseline creatinine is now about 3 (  3.1 in 2/16). Avoid nephrotoxins. No NSAIDs. Would not use an ACE inhibitor or ARB.  We spent much of the time speaking about his son, who has hypertrophic obstructive cardiomyopathy and is considering defibrillator placement. The patient expressed concern that the son is feeling more stressed because he  has to go for a second opinion. I explained to him that Dr. Caryl Comes has made his recommendation for defibrillator, and it was the patient's choice whether or not he wants a second opinion or not. It was not mandatory and if the patient felt more comfortable following Dr. Olin Pia recommendations, that would be fine as well.    Signed:   Mina Marble, MD, [facility] 09/14/2014, 9:56 AM

## 2014-09-16 ENCOUNTER — Telehealth: Payer: Self-pay | Admitting: Pharmacist

## 2014-09-16 DIAGNOSIS — I10 Essential (primary) hypertension: Secondary | ICD-10-CM

## 2014-09-16 MED ORDER — HYDRALAZINE HCL 50 MG PO TABS: 50.0000 mg | ORAL_TABLET | Freq: Three times a day (TID) | ORAL | Status: DC

## 2014-09-16 NOTE — Telephone Encounter (Signed)
Pharmacist Hypertension Clinic - Resident Research Study  S/O: Joshua Thompson is a 69 y.o. male.  Calling pt today for 8wk HTN study f/u visit, referred to hypertension clinic by Dr. Irish Lack.  Pt has history of kidney disease, follows with nephrologist Dr. Rogelia Mire.  He has been taking amlodipine since before coming to [facility] and it was increased from 5 mg daily to 10 mg daily in May 2015.  He has had no complaints or problems with this.  His carvedilol was doubled from 6.25mg  to 12.5mg  twice daily in Jan 2016.  He began taking hydralazine 25mg  twice daily in Feb 2016. He was instructed to begin taking it three times daily during our last visit, but he only recently began taking it three times daily d/t concerns of running out (although a new Rx had been sent in).  He had an appointment with Dr. Irish Lack on 09/14/14 and it was discovered that he is now taking both carvedilol and nebivolol - to be addressed by pharmacy in this hypertension clinic.  He has been very difficult to get in touch with and was finally reached today (09/16/2014).  He insists that the nebivolol has been working better at controlling his BP than the carvedilol.  Current HTN meds: Amlodipine 10mg  daily Carvedilol 12.5mg  twice daily Hydralazine 25mg  three times daily (recently increased from BID) Nebivolol 5mg  daily  NOTE: AVOID ACEi/ARB due to kidney disease documented in chart, but ACEi/ARB is actually INDICATED in patients w/ CKD  FH:  Mother had hypertension, son has hypertrophic obstructive cardiomyopath  SH:  Does not drink alcohol or use tobacco  Wt Readings from Last 3 Encounters:  09/14/14 163 lb (73.936 kg)  07/14/14 164 lb (74.39 kg)  07/06/14 164 lb (74.39 kg)   BP Readings from Last 3 Encounters:  09/16/14 148/80  08/12/14 165/84  07/14/14 152/84   Pulse Readings from Last 3 Encounters:  09/14/14 65  07/06/14 88  12/20/13 64    Renal function: CrCl ~25 mL/min, K+ remains elevated  5.8>>5.2 (pt has nephrologist who is monitoring and addressing)   Outpatient Encounter Prescriptions as of 09/16/2014  Medication Sig  . amLODipine (NORVASC) 10 MG tablet Take 1 tablet (10 mg total) by mouth daily.  Marland Kitchen aspirin EC 81 MG tablet Take 1 tablet (81 mg total) by mouth daily.  Marland Kitchen atorvastatin (LIPITOR) 20 MG tablet Take 1 tablet (20 mg total) by mouth daily.  . hydrALAZINE (APRESOLINE) 50 MG tablet Take 1 tablet (50 mg total) by mouth 3 (three) times daily.  . nebivolol (BYSTOLIC) 5 MG tablet Take 5 mg by mouth daily.  . [DISCONTINUED] carvedilol (COREG) 12.5 MG tablet Take 1 tablet (12.5 mg total) by mouth 2 (two) times daily.  . [DISCONTINUED] hydrALAZINE (APRESOLINE) 25 MG tablet Take 1 tablet (25 mg total) by mouth 3 (three) times daily.  . nitroGLYCERIN (NITROSTAT) 0.4 MG SL tablet Place 1 tablet (0.4 mg total) under the tongue every 5 (five) minutes as needed for chest pain. (Patient not taking: Reported on 09/16/2014)    Allergies: Allergies  Allergen Reactions  . Ampicillin Rash    A/P: Pt has qualified for study enrollment and has signed informed consent. Pt was randomized to study group: Home BP monitoring w/ telephone f/u  Pt is NOT at BP goal of < 130/90 mmHg (MD preferred goal).  Stop taking carvedilol Increase hydralazine to 50mg  TID Continue checking home BP and recording results F/U in clinic 10/12/2014, bring BP log  Future visits may consider increasing nebivolol  if additional BP lowering is needed.  May also consider addition of ACEi/ARB d/t mortality benefit and reduction in CV events and renal failure in CKD patients - need to closely monitor K+.  Drucie Opitz, PharmD Clinical Pharmacy Resident

## 2014-10-12 ENCOUNTER — Ambulatory Visit: Payer: 59 | Admitting: Pharmacist

## 2014-10-18 ENCOUNTER — Ambulatory Visit: Payer: 59 | Admitting: Pharmacist

## 2014-10-27 ENCOUNTER — Ambulatory Visit: Payer: 59 | Admitting: Pharmacist

## 2014-11-01 ENCOUNTER — Other Ambulatory Visit: Payer: Self-pay | Admitting: Pharmacist

## 2014-11-01 DIAGNOSIS — I10 Essential (primary) hypertension: Secondary | ICD-10-CM

## 2014-11-01 MED ORDER — NEBIVOLOL HCL 5 MG PO TABS: 5.0000 mg | ORAL_TABLET | Freq: Every day | ORAL | Status: DC

## 2014-11-15 ENCOUNTER — Encounter: Payer: Self-pay | Admitting: Pharmacist

## 2014-11-15 ENCOUNTER — Ambulatory Visit (INDEPENDENT_AMBULATORY_CARE_PROVIDER_SITE_OTHER): Payer: Self-pay | Admitting: Pharmacist

## 2014-11-15 VITALS — BP 128/60 | HR 66 | Wt 162.5 lb

## 2014-11-15 DIAGNOSIS — I10 Essential (primary) hypertension: Secondary | ICD-10-CM

## 2014-11-15 NOTE — Patient Instructions (Addendum)
It has been a pleasure working with you these past few months.  Continue taking your medicines as prescribed We will check some blood work today I will call you with the results and let you know if we need to make any changes to your medicines Continue care with your primary care doctor and Dr. Irish Lack  HOW TO TAKE YOUR BLOOD PRESSURE: -Rest 5 minutes before taking your blood pressure -Do not drink caffeinated beverages for at least 30 minutes before -Take your blood pressure before (not after) you eat -Sit comfortably with your back supported and both feet on the floor (don't cross your legs) -Elevate your arm to heart level on a table or a desk -There should be enough room to slip a fingertip under the cuff. The bottom edge of the cuff should be 1 inch above the crease of the elbow -Ideally, take 3 measurements at one sitting and record the average

## 2014-11-15 NOTE — Progress Notes (Signed)
Pharmacist Hypertension Clinic - Resident Research Study  S/O: Joshua Thompson is a 69 y.o. male presenting for 12wk HTN study f/u visit, referred to hypertension clinic by Dr. Irish Lack.  Pt has history of kidney disease, follows with nephrologist Dr. Rogelia Mire.  He has been taking amlodipine since before coming to [facility] and it was increased from 5 mg daily to 10 mg daily in May 2015.  He reports some mild LEE, but that it paradoxically improved when his amlodipine dose was increased and he wishes to keep taking the amlodipine.  His carvedilol was doubled from 6.25mg  to 12.5mg  twice daily in Jan 2016.  He had an appointment with Dr. Irish Lack on 09/14/14 and it was discovered that he was taking both carvedilol and nebivolol - to be addressed by pharmacy in this hypertension clinic.  He has been very difficult to get in touch with and was finally reached 09/16/2014.  He insisted that the nebivolol has been working better at controlling his BP than the carvedilol, so his carvedilol was discontinued.  Current HTN meds: Amlodipine 10mg  daily Hydralazine 50mg  three times daily Nebivolol 5mg  daily  NOTE: AVOID ACEi/ARB due to kidney disease documented in chart, but ACEi/ARB is actually INDICATED in patients w/ CKD  FH:  Mother had hypertension, son has hypertrophic obstructive cardiomyopath  SH:  Does not drink alcohol or use tobacco  Wt Readings from Last 3 Encounters:  11/15/14 162 lb 8 oz (73.71 kg)  09/14/14 163 lb (73.936 kg)  07/14/14 164 lb (74.39 kg)   BP Readings from Last 3 Encounters:  11/15/14 126/60  09/16/14 148/80  08/12/14 165/84   Pulse Readings from Last 3 Encounters:  09/14/14 65  07/06/14 88  12/20/13 64    Renal function: CrCl ~20 mL/min, K+ 4.7 (pt has nephrologist who is monitoring and addressing)   Outpatient Encounter Prescriptions as of 11/15/2014  Medication Sig  . amLODipine (NORVASC) 10 MG tablet Take 1 tablet (10 mg total) by mouth daily.  Marland Kitchen  aspirin EC 81 MG tablet Take 1 tablet (81 mg total) by mouth daily.  Marland Kitchen atorvastatin (LIPITOR) 20 MG tablet Take 1 tablet (20 mg total) by mouth daily.  . hydrALAZINE (APRESOLINE) 50 MG tablet Take 1 tablet (50 mg total) by mouth 3 (three) times daily.  . nebivolol (BYSTOLIC) 5 MG tablet Take 1 tablet (5 mg total) by mouth daily.  . nitroGLYCERIN (NITROSTAT) 0.4 MG SL tablet Place 1 tablet (0.4 mg total) under the tongue every 5 (five) minutes as needed for chest pain. (Patient not taking: Reported on 11/15/2014)   No facility-administered encounter medications on file as of 11/15/2014.    Allergies: Allergies  Allergen Reactions  . Ampicillin Rash    A/P: Pt has qualified for study enrollment and has signed informed consent. Pt was randomized to study group: Home BP monitoring w/ telephone f/u  Pt is at BP goal of < 130/90 mmHg (MD preferred goal).  Continue taking all medicines as prescribed Monitor for improvement in LEE Increase exercise and incorporation of DASH diet recommendations Continue care with PCP, nephrologist, and cardiologist  Future visits may consider stopping amlodipine if LEE does not improve.  If additional BP lowering is needed, may increase nebivolol.  May also consider addition of ACEi/ARB d/t mortality benefit and reduction in CV events and renal failure in CKD patients - need to closely monitor K+.  Avoid thiazide diuretics d/t CrCl < 30 mL/min.  Drucie Opitz, PharmD Clinical Pharmacy Resident

## 2014-11-16 LAB — BASIC METABOLIC PANEL
BUN: 40 mg/dL — AB (ref 6–23)
CALCIUM: 8.9 mg/dL (ref 8.4–10.5)
CO2: 21 mEq/L (ref 19–32)
Chloride: 113 mEq/L — ABNORMAL HIGH (ref 96–112)
Creatinine, Ser: 3.39 mg/dL — ABNORMAL HIGH (ref 0.40–1.50)
GFR: 19.34 mL/min — ABNORMAL LOW (ref >60.00)
Glucose, Bld: 84 mg/dL (ref 70–99)
POTASSIUM: 4.7 meq/L (ref 3.5–5.1)
SODIUM: 140 meq/L (ref 135–145)

## 2015-01-03 ENCOUNTER — Other Ambulatory Visit: Payer: Self-pay

## 2015-01-03 MED ORDER — ATORVASTATIN CALCIUM 20 MG PO TABS: 20.0000 mg | ORAL_TABLET | Freq: Every day | ORAL | Status: DC

## 2015-01-30 ENCOUNTER — Other Ambulatory Visit: Payer: Self-pay

## 2015-01-30 MED ORDER — AMLODIPINE BESYLATE 10 MG PO TABS
10.0000 mg | ORAL_TABLET | Freq: Every day | ORAL | Status: DC
Start: 2015-01-30 — End: 2015-07-31

## 2015-02-12 ENCOUNTER — Encounter (HOSPITAL_COMMUNITY): Payer: Self-pay | Admitting: Emergency Medicine

## 2015-02-12 ENCOUNTER — Inpatient Hospital Stay (HOSPITAL_COMMUNITY)
Admission: EM | Admit: 2015-02-12 | Discharge: 2015-02-15 | DRG: 684 | Disposition: A | Discharge status: Home / Self Care | Payer: 59 | Attending: Internal Medicine | Admitting: Internal Medicine

## 2015-02-12 ENCOUNTER — Emergency Department (HOSPITAL_COMMUNITY): Payer: 59

## 2015-02-12 DIAGNOSIS — N401 Enlarged prostate with lower urinary tract symptoms: Secondary | ICD-10-CM | POA: Diagnosis present

## 2015-02-12 DIAGNOSIS — Z79899 Other long term (current) drug therapy: Secondary | ICD-10-CM

## 2015-02-12 DIAGNOSIS — R319 Hematuria, unspecified: Secondary | ICD-10-CM | POA: Diagnosis present

## 2015-02-12 DIAGNOSIS — R339 Retention of urine, unspecified: Secondary | ICD-10-CM | POA: Diagnosis present

## 2015-02-12 DIAGNOSIS — R112 Nausea with vomiting, unspecified: Secondary | ICD-10-CM | POA: Diagnosis present

## 2015-02-12 DIAGNOSIS — R338 Other retention of urine: Secondary | ICD-10-CM | POA: Diagnosis not present

## 2015-02-12 DIAGNOSIS — I2510 Atherosclerotic heart disease of native coronary artery without angina pectoris: Secondary | ICD-10-CM | POA: Diagnosis present

## 2015-02-12 DIAGNOSIS — N183 Chronic kidney disease, stage 3 unspecified: Secondary | ICD-10-CM | POA: Diagnosis present

## 2015-02-12 DIAGNOSIS — Z880 Allergy status to penicillin: Secondary | ICD-10-CM

## 2015-02-12 DIAGNOSIS — Z8249 Family history of ischemic heart disease and other diseases of the circulatory system: Secondary | ICD-10-CM

## 2015-02-12 DIAGNOSIS — N179 Acute kidney failure, unspecified: Principal | ICD-10-CM | POA: Diagnosis present

## 2015-02-12 DIAGNOSIS — D649 Anemia, unspecified: Secondary | ICD-10-CM | POA: Diagnosis present

## 2015-02-12 DIAGNOSIS — R42 Dizziness and giddiness: Secondary | ICD-10-CM | POA: Diagnosis present

## 2015-02-12 DIAGNOSIS — I25119 Atherosclerotic heart disease of native coronary artery with unspecified angina pectoris: Secondary | ICD-10-CM | POA: Diagnosis present

## 2015-02-12 DIAGNOSIS — I10 Essential (primary) hypertension: Secondary | ICD-10-CM | POA: Diagnosis present

## 2015-02-12 DIAGNOSIS — Z79891 Long term (current) use of opiate analgesic: Secondary | ICD-10-CM | POA: Diagnosis not present

## 2015-02-12 DIAGNOSIS — Z9079 Acquired absence of other genital organ(s): Secondary | ICD-10-CM | POA: Diagnosis present

## 2015-02-12 DIAGNOSIS — E875 Hyperkalemia: Secondary | ICD-10-CM | POA: Diagnosis present

## 2015-02-12 DIAGNOSIS — I129 Hypertensive chronic kidney disease with stage 1 through stage 4 chronic kidney disease, or unspecified chronic kidney disease: Secondary | ICD-10-CM | POA: Diagnosis present

## 2015-02-12 DIAGNOSIS — R269 Unspecified abnormalities of gait and mobility: Secondary | ICD-10-CM | POA: Diagnosis present

## 2015-02-12 DIAGNOSIS — I25118 Atherosclerotic heart disease of native coronary artery with other forms of angina pectoris: Secondary | ICD-10-CM | POA: Diagnosis present

## 2015-02-12 LAB — URINALYSIS, ROUTINE W REFLEX MICROSCOPIC
Bilirubin Urine: NEGATIVE
Glucose, UA: NEGATIVE mg/dL
Hgb urine dipstick: NEGATIVE
KETONES UR: NEGATIVE mg/dL
LEUKOCYTES UA: NEGATIVE
NITRITE: NEGATIVE
PROTEIN: NEGATIVE mg/dL
Specific Gravity, Urine: 1.012 (ref 1.005–1.030)
Urobilinogen, UA: 0.2 mg/dL (ref 0.0–1.0)
pH: 7 (ref 5.0–8.0)

## 2015-02-12 LAB — I-STAT TROPONIN, ED: TROPONIN I, POC: 0 ng/mL (ref 0.00–0.08)

## 2015-02-12 LAB — CBC WITH DIFFERENTIAL/PLATELET
BASOS ABS: 0 10*3/uL (ref 0.0–0.1)
Basophils Relative: 0 % (ref 0–1)
Eosinophils Absolute: 0 10*3/uL (ref 0.0–0.7)
Eosinophils Relative: 0 % (ref 0–5)
HEMATOCRIT: 38.3 % — AB (ref 39.0–52.0)
HEMOGLOBIN: 12.3 g/dL — AB (ref 13.0–17.0)
LYMPHS PCT: 6 % — AB (ref 12–46)
Lymphs Abs: 0.5 10*3/uL — ABNORMAL LOW (ref 0.7–4.0)
MCH: 30.8 pg (ref 26.0–34.0)
MCHC: 32.1 g/dL (ref 30.0–36.0)
MCV: 95.8 fL (ref 78.0–100.0)
Monocytes Absolute: 0.3 10*3/uL (ref 0.1–1.0)
Monocytes Relative: 3 % (ref 3–12)
NEUTROS ABS: 8 10*3/uL — AB (ref 1.7–7.7)
Neutrophils Relative %: 91 % — ABNORMAL HIGH (ref 43–77)
Platelets: 218 10*3/uL (ref 150–400)
RBC: 4 MIL/uL — AB (ref 4.22–5.81)
RDW: 12.9 % (ref 11.5–15.5)
WBC: 8.8 10*3/uL (ref 4.0–10.5)

## 2015-02-12 LAB — COMPREHENSIVE METABOLIC PANEL
ALBUMIN: 4.4 g/dL (ref 3.5–5.0)
ALT: 17 U/L (ref 17–63)
ANION GAP: 6 (ref 5–15)
AST: 23 U/L (ref 15–41)
Alkaline Phosphatase: 69 U/L (ref 38–126)
BILIRUBIN TOTAL: 0.6 mg/dL (ref 0.3–1.2)
BUN: 42 mg/dL — AB (ref 6–20)
CHLORIDE: 109 mmol/L (ref 101–111)
CO2: 20 mmol/L — ABNORMAL LOW (ref 22–32)
Calcium: 9.1 mg/dL (ref 8.9–10.3)
Creatinine, Ser: 3.67 mg/dL — ABNORMAL HIGH (ref 0.61–1.24)
GFR calc Af Amer: 18 mL/min — ABNORMAL LOW (ref >60)
GFR, EST NON AFRICAN AMERICAN: 16 mL/min — AB (ref >60)
Glucose, Bld: 150 mg/dL — ABNORMAL HIGH (ref 65–99)
POTASSIUM: 6.3 mmol/L — AB (ref 3.5–5.1)
Sodium: 135 mmol/L (ref 135–145)
TOTAL PROTEIN: 7.7 g/dL (ref 6.5–8.1)

## 2015-02-12 LAB — CBG MONITORING, ED: Glucose-Capillary: 196 mg/dL — ABNORMAL HIGH (ref 65–99)

## 2015-02-12 LAB — POTASSIUM: POTASSIUM: 5.7 mmol/L — AB (ref 3.5–5.1)

## 2015-02-12 MED ORDER — SODIUM CHLORIDE 0.9 % IV BOLUS (SEPSIS)
1000.0000 mL | Freq: Once | INTRAVENOUS | Status: AC
  Administered 2015-02-12: 1000 mL via INTRAVENOUS

## 2015-02-12 MED ORDER — INSULIN GLARGINE 100 UNIT/ML ~~LOC~~ SOLN
10.0000 [IU] | Freq: Once | SUBCUTANEOUS | Status: AC
  Administered 2015-02-12: 10 [IU] via SUBCUTANEOUS
  Filled 2015-02-12: qty 0.1

## 2015-02-12 MED ORDER — SODIUM CHLORIDE 0.9 % IV SOLN
1.0000 g | Freq: Once | INTRAVENOUS | Status: AC
  Administered 2015-02-12: 1 g via INTRAVENOUS
  Filled 2015-02-12: qty 10

## 2015-02-12 MED ORDER — DEXTROSE-NACL 5-0.9 % IV SOLN
INTRAVENOUS | Status: AC
Start: 2015-02-12 — End: 2015-02-13
  Administered 2015-02-12 – 2015-02-13 (×2): via INTRAVENOUS

## 2015-02-12 MED ORDER — SODIUM POLYSTYRENE SULFONATE 15 GM/60ML PO SUSP
30.0000 g | Freq: Once | ORAL | Status: AC
  Administered 2015-02-12: 30 g via ORAL
  Filled 2015-02-12: qty 120

## 2015-02-12 MED ORDER — DEXTROSE 50 % IV SOLN
1.0000 | Freq: Once | INTRAVENOUS | Status: AC
  Administered 2015-02-12: 50 mL via INTRAVENOUS
  Filled 2015-02-12: qty 50

## 2015-02-12 MED ORDER — PROMETHAZINE HCL 25 MG/ML IJ SOLN
12.5000 mg | Freq: Once | INTRAMUSCULAR | Status: AC
  Administered 2015-02-12: 12.5 mg via INTRAVENOUS
  Filled 2015-02-12: qty 1

## 2015-02-12 MED ORDER — MECLIZINE HCL 25 MG PO TABS
50.0000 mg | ORAL_TABLET | Freq: Once | ORAL | Status: AC
  Administered 2015-02-12: 50 mg via ORAL
  Filled 2015-02-12: qty 2

## 2015-02-12 MED ORDER — SODIUM CHLORIDE 0.9 % IV SOLN
Freq: Once | INTRAVENOUS | Status: AC
  Administered 2015-02-12: 21:00:00 via INTRAVENOUS

## 2015-02-12 MED ORDER — MORPHINE SULFATE (PF) 2 MG/ML IV SOLN
2.0000 mg | Freq: Once | INTRAVENOUS | Status: AC
  Administered 2015-02-12: 2 mg via INTRAVENOUS
  Filled 2015-02-12: qty 1

## 2015-02-12 NOTE — H&P (Addendum)
[facility] Admission History and Physical       Joshua Thompson [id number] DOB: May 09, 1946 DOA: 02/12/2015  Referring physician: EDP PCP: No primary care provider on file.  Specialists:   Chief Complaint: Urinary Retention  HPI: Joshua Thompson is a 69 y.o. male with a history of CAD, HTN, Stage III CKD, and BPH S/P TURP x 4 , and recent TURP 5 days ago at [facility] who presents to the ED with complaints of worsening Urinary retention since foley catheter removal 2 days ago, along with ABD pain, Nausea and Vomiting and Dizziness today.   He reports having hematuria since removal of the foley and straining to have urine flow.  He reports not having any urine output since 10 AM.   He reports that he began to have dizziness this afternoon which occurred when he turns his head.  His potassium level and BUNCr were elevated at 5.7 and 42/3.67 respectively.  A foley catheter was placed in the ED.      ADDENDUM:   Patient was administered IV Dextrose, IV Lantus Insulin 10 units and IV Calcium Gluconate x 1 to treat his Hyperkalemia, So His Glucose levels will be monitored every 1 hour and he has been placed on IVFs of D5NSS at 75 ml /hr, and the Hypoglycemic Protocol has been ordered.      Review of Systems:  Constitutional: No Weight Loss, No Weight Gain, Night Sweats, Fevers, Chills, Dizziness, Light Headedness, Fatigue, or Generalized Weakness HEENT: No Headaches, Difficulty Swallowing,Tooth/Dental Problems,Sore Throat,  No Sneezing, Rhinitis, Ear Ache, Nasal Congestion, or Post Nasal Drip,  Cardio-vascular:  No Chest pain, Orthopnea, PND, Edema in Lower Extremities, Anasarca, Dizziness, Palpitations  Resp: No Dyspnea, No DOE, No Productive Cough, No Non-Productive Cough, No Hemoptysis, No Wheezing.    GI: No Heartburn, Indigestion, +Abdominal Pain, +Nausea, +Vomiting, Diarrhea, Constipation, Hematemesis, Hematochezia, Melena, Change in Bowel  Habits,  Loss of Appetite  GU: No Dysuria, No Urgency or Urinary Frequency,+ hematuria,  +Urinary Retention, No Flank pain.  Musculoskeletal: No Joint Pain or Swelling, No Decreased Range of Motion, No Back Pain.  Neurologic: No Syncope, No Seizures, Muscle Weakness, Paresthesia, Vision Disturbance or Loss, No Diplopia, +Vertigo, No Difficulty Walking,  Skin: No Rash or Lesions. Psych: No Change in Mood or Affect, No Depression or Anxiety, No Memory loss, No Confusion, or Hallucinations   Past Medical History  Diagnosis Date  . Coronary artery disease   . Hypertension        CKD   Past Surgical History  Procedure Laterality Date  . Coronary angioplasty      3 STENTS  . Transurethral resection of prostate      HAD DONE THREE TIMES      Prior to Admission medications   Medication Sig Start Date End Date Taking? Authorizing Provider  amLODipine (NORVASC) 10 MG tablet Take 1 tablet (10 mg total) by mouth daily. 01/30/15  Yes Jettie Booze, MD  atorvastatin (LIPITOR) 20 MG tablet Take 1 tablet (20 mg total) by mouth daily. 01/03/15  Yes Jettie Booze, MD  febuxostat (ULORIC) 40 MG tablet Take 40 mg by mouth daily.   Yes Historical Provider, MD  hydrALAZINE (APRESOLINE) 50 MG tablet Take 1 tablet (50 mg total) by mouth 3 (three) times daily. 09/16/14  Yes Jettie Booze, MD  HYDROcodone-acetaminophen (NORCO/VICODIN) 5-325 MG per tablet Take 1 tablet by mouth every 6 (six) hours as needed for moderate pain or severe pain.   Yes  Historical Provider, MD  Magnesium 250 MG TABS Take 1 tablet by mouth daily.   Yes Historical Provider, MD  metoprolol tartrate (LOPRESSOR) 25 MG tablet Take 25 mg by mouth 2 (two) times daily as needed (high blood pressure).   Yes Historical Provider, MD  nebivolol (BYSTOLIC) 5 MG tablet Take 1 tablet (5 mg total) by mouth daily. 11/01/14  Yes Jettie Booze, MD  sennosides-docusate sodium (SENOKOT-S) 8.6-50 MG tablet Take 1 tablet by mouth daily.    Yes Historical Provider, MD  aspirin EC 81 MG tablet Take 1 tablet (81 mg total) by mouth daily. Patient not taking: Reported on 02/12/2015 10/21/13   Jettie Booze, MD  belladonna-opium (B&O SUPPRETTES) 16.2-30 MG suppository UNW AND I 1 SUP REC Q 8 H PRN 02/07/15   Historical Provider, MD  nitroGLYCERIN (NITROSTAT) 0.4 MG SL tablet Place 1 tablet (0.4 mg total) under the tongue every 5 (five) minutes as needed for chest pain. 10/21/13   Jettie Booze, MD  oxybutynin (DITROPAN) 5 MG tablet TK 1 T PO TID PRN 02/07/15   Historical Provider, MD     Allergies  Allergen Reactions  . Ampicillin Rash     Social History:  reports that he has never smoked. He does not have any smokeless tobacco history on file. He reports that he does not drink alcohol. His drug history is not on file.      Family History  Problem Relation Age of Onset  . Hypertension Mother        Physical Exam:  GEN:  Pleasant Well Nourished and Well Developed 69 y.o. [place] male examined and in no acute distress; cooperative with exam Filed Vitals:   02/12/15 1826 02/12/15 1827 02/12/15 1900 02/12/15 2000  BP: 157/65  143/61 124/60  Pulse: 81  80   Temp: 97.5 F (36.4 C)     TempSrc: Oral     Resp: 24  22 16   Height: 5\' 7"  (1.702 m)     Weight: 70.308 kg (155 lb)     SpO2: 100% 100% 99%    Blood pressure 124/60, pulse 80, temperature 97.5 F (36.4 C), temperature source Oral, resp. rate 16, height 5\' 7"  (1.702 m), weight 70.308 kg (155 lb), SpO2 99 %. PSYCH: He is alert and oriented x4; does not appear anxious does not appear depressed; affect is normal HEENT: Normocephalic and Atraumatic, Mucous membranes pink; PERRLA; EOM intact; Fundi:  Benign;  No scleral icterus, Nares: Patent, Oropharynx: Clear, Fair Dentition,    Neck:  FROM, No Cervical Lymphadenopathy nor Thyromegaly or Carotid Bruit; No JVD; Breasts:: Not examined CHEST WALL: No tenderness CHEST: Normal respiration, clear to auscultation  bilaterally HEART: Regular rate and rhythm; no murmurs rubs or gallops BACK: No kyphosis or scoliosis; No CVA tenderness ABDOMEN: Positive Bowel Sounds, Soft Non-Tender, No Rebound or Guarding; No Masses, No Organomegaly Rectal Exam: Not done EXTREMITIES: No Cyanosis, Clubbing, or Edema; No Ulcerations. Genitalia: not examined PULSES: 2+ and symmetric SKIN: Normal hydration no rash or ulceration CNS:  Alert and Oriented x 4, No Focal Deficits Vascular: pulses palpable throughout    Labs on Admission:  Basic Metabolic Panel:  Recent Labs Lab 02/12/15 1859 02/12/15 2015  NA 135  --   K 6.3* 5.7*  CL 109  --   CO2 20*  --   GLUCOSE 150*  --   BUN 42*  --   CREATININE 3.67*  --   CALCIUM 9.1  --    Liver Function  Tests:  Recent Labs Lab 02/12/15 1859  AST 23  ALT 17  ALKPHOS 69  BILITOT 0.6  PROT 7.7  ALBUMIN 4.4   No results for input(s): LIPASE, AMYLASE in the last 168 hours. No results for input(s): AMMONIA in the last 168 hours. CBC:  Recent Labs Lab 02/12/15 1859  WBC 8.8  NEUTROABS 8.0*  HGB 12.3*  HCT 38.3*  MCV 95.8  PLT 218   Cardiac Enzymes: No results for input(s): CKTOTAL, CKMB, CKMBINDEX, TROPONINI in the last 168 hours.  BNP (last 3 results) No results for input(s): BNP in the last 8760 hours.  ProBNP (last 3 results) No results for input(s): PROBNP in the last 8760 hours.  CBG: No results for input(s): GLUCAP in the last 168 hours.  Radiological Exams on Admission: No results found.   EKG: Independently reviewed. NOrmal sinus Rhythm rate = 72  +Peaked T-waves Present       Assessment/Plan:      69 y.o. male with  Principal Problem:   1.    Hyperkalemia   Telemetry Monitoring   Kayexalate PO x 1, Repeat if K+ > 5.3   Active Problems:   2.    Urinary retention   Foley Placed   Start Flomax   Discontinue Oxybutynin   Monitor I/Os   Urology consult in AM       3.    AKI (acute kidney injury)- Post Renal,and  PreRenal   IVFs   Monitor BUN/Cr     4.    CKD (chronic kidney disease), stage III  ` Previous BUN/Cr 2 months ago 40/3.39   Monitor BUN/Cr     5.    Vertigo- due to Metabolic causes versus Benign Positional   Meclizine PRN     6.    Nausea and vomiting   PRN      7.    Essential hypertension, benign   IV Hydralazine PRN    Continue Hydralazine, Bystolic, and Amlodipine as BP tolerates   Monitor BPs       8.    Coronary atherosclerosis of native coronary artery   Stable   Continue Beta Blocker , Statin RX and      9.    DVT Prophylaxis   Lovenox      Code Status:     FULL CODE       Family Communication:   No  Family Present     Disposition Plan:    Inpatient Status           Time spent:  [facility] Pager [phone]   If Max Please Contact the Day Rounding Team MD for [facility]  If 7PM-7AM, Please Contact Night-Floor Coverage  [email or web address] Password [email or web address] 02/12/2015, 9:25 PM     ADDENDUM:   Patient was seen and examined on 02/12/2015

## 2015-02-12 NOTE — ED Notes (Signed)
Pt arrived via EMS with report of near syncope episode with dizziness, n/v, generalized weakness but denies diarrhea and recent prostate sx x1 week ago. Pt denies visual disturbances, and headaches.  Pt reported hematuria after sx and currently has not voided since 1000 and no urinary urge.

## 2015-02-12 NOTE — ED Notes (Signed)
Critical K reported as 6.3, Christina RN and Dr. Viviana Simpler notified, repeat K ordered

## 2015-02-12 NOTE — ED Provider Notes (Addendum)
CSN: [id number]     Arrival date & time 02/12/15  1749 History   First MD Initiated Contact with Patient 02/12/15 1752     Chief Complaint  Patient presents with  . Loss of Consciousness     (Consider location/radiation/quality/duration/timing/severity/associated sxs/prior Treatment) HPI  69 year old male with history of CAD and hypertension who presents with dizziness, nausea, vomiting, and difficulty urinating. He is status post [facility]. L1 day afterwards, he reports developing dizziness intermittently whenever he would turn his head. Describes that the room has picked up and started moving around him. This is been associated with nausea and decreased by mouth intake. Over the past 24 hours he has had intractable nausea and vomiting with this. He has had some gait instability, and while trying to urinate, was unsteady and fell. He did not hit his head, and despite documented chief complaint he did not have any loss of consciousness. He denies any associated chest pain, shortness of breath, headache, weakness or numbness, diplopia, or speech changes. Over the course of the past day he also reports having difficulty urinating, where he is unable to fully empty his bladder. He denies dysuria, urinary frequency, or flank pain. Does have generalized abdominal tenderness. Denies fever, chills, distension. Normal bowel movement this morning.  Past Medical History  Diagnosis Date  . Coronary artery disease   . Hypertension    Past Surgical History  Procedure Laterality Date  . Coronary angioplasty      3 STENTS  . Transurethral resection of prostate      HAD DONE THREE TIMES   Family History  Problem Relation Age of Onset  . Hypertension Mother    Social History  Substance Use Topics  . Smoking status: Never Smoker   . Smokeless tobacco: None  . Alcohol Use: No    Review of Systems 10/14 systems reviewed and are negative other than those stated in  the HPI  Allergies  Ampicillin  Home Medications   Prior to Admission medications   Medication Sig Start Date End Date Taking? Authorizing Provider  amLODipine (NORVASC) 10 MG tablet Take 1 tablet (10 mg total) by mouth daily. 01/30/15  Yes Jettie Booze, MD  atorvastatin (LIPITOR) 20 MG tablet Take 1 tablet (20 mg total) by mouth daily. 01/03/15  Yes Jettie Booze, MD  febuxostat (ULORIC) 40 MG tablet Take 40 mg by mouth daily.   Yes Historical Provider, MD  hydrALAZINE (APRESOLINE) 50 MG tablet Take 1 tablet (50 mg total) by mouth 3 (three) times daily. 09/16/14  Yes Jettie Booze, MD  HYDROcodone-acetaminophen (NORCO/VICODIN) 5-325 MG per tablet Take 1 tablet by mouth every 6 (six) hours as needed for moderate pain or severe pain.   Yes Historical Provider, MD  Magnesium 250 MG TABS Take 1 tablet by mouth daily.   Yes Historical Provider, MD  metoprolol tartrate (LOPRESSOR) 25 MG tablet Take 25 mg by mouth 2 (two) times daily as needed (high blood pressure).   Yes Historical Provider, MD  nebivolol (BYSTOLIC) 5 MG tablet Take 1 tablet (5 mg total) by mouth daily. 11/01/14  Yes Jettie Booze, MD  sennosides-docusate sodium (SENOKOT-S) 8.6-50 MG tablet Take 1 tablet by mouth daily.   Yes Historical Provider, MD  aspirin EC 81 MG tablet Take 1 tablet (81 mg total) by mouth daily. Patient not taking: Reported on 02/12/2015 10/21/13   Jettie Booze, MD  belladonna-opium (B&O SUPPRETTES) 16.2-30 MG suppository UNW AND  I 1 SUP REC Q 8 H PRN 02/07/15   Historical Provider, MD  nitroGLYCERIN (NITROSTAT) 0.4 MG SL tablet Place 1 tablet (0.4 mg total) under the tongue every 5 (five) minutes as needed for chest pain. 10/21/13   Jettie Booze, MD  oxybutynin (DITROPAN) 5 MG tablet TK 1 T PO TID PRN 02/07/15   Historical Provider, MD   BP 124/60 mmHg  Pulse 80  Temp(Src) 97.5 F (36.4 C) (Oral)  Resp 16  Ht 5\' 7"  (1.702 m)  Wt 155 lb (70.308 kg)  BMI 24.27 kg/m2  SpO2  99% Physical Exam Physical Exam  Nursing note and vitals reviewed. Constitutional: Well developed, well nourished, non-toxic, and in no acute distress Head: Normocephalic and atraumatic.  Mouth/Throat: Oropharynx is clear and moist.  Neck: Normal range of motion. Neck supple.  Cardiovascular: Normal rate and regular rhythm.   Pulmonary/Chest: Effort normal and breath sounds normal.  Abdominal: Soft. There is no tenderness. There is no rebound and no guarding.  Musculoskeletal: Normal range of motion.  Neurological:  Neurological:  Alert, oriented to person, place, time, and situation. Memory grossly in tact. Fluent speech. No dysarthria or aphasia.  Cranial nerves: VF are full. Fundoscopic exam-unable to get good visualization of the discs. Pupils are symmetric, and reactive to light. EOMI without nystagmus. No gaze deviation. Facial muscles symmetric with activation. Sensation to light touch over face in tact bilaterally. Hearing grossly in tact. Palate elevates symmetrically. Head turn and shoulder shrug are intact. Tongue midline.  Muscle bulk and tone normal. No pronator drift. Moves all extremities symmetrically. Sensation to light touch is in tact throughout in bilateral upper and lower extremities. Coordination reveals no dysmetria with finger to nose. Gait is narrow-based and steady. Difficulty with tandem gait.   Skin: Skin is warm and dry.  Psychiatric: Cooperative  ED Course  Procedures (including critical care time) Labs Review Labs Reviewed  CBC WITH DIFFERENTIAL/PLATELET - Abnormal; Notable for the following:    RBC 4.00 (*)    Hemoglobin 12.3 (*)    HCT 38.3 (*)    Neutrophils Relative % 91 (*)    Neutro Abs 8.0 (*)    Lymphocytes Relative 6 (*)    Lymphs Abs 0.5 (*)    All other components within normal limits  COMPREHENSIVE METABOLIC PANEL - Abnormal; Notable for the following:    Potassium 6.3 (*)    CO2 20 (*)    Glucose, Bld 150 (*)    BUN 42 (*)     Creatinine, Ser 3.67 (*)    GFR calc non Af Amer 16 (*)    GFR calc Af Amer 18 (*)    All other components within normal limits  URINALYSIS, ROUTINE W REFLEX MICROSCOPIC (NOT AT [facility]) - Abnormal; Notable for the following:    APPearance CLOUDY (*)    All other components within normal limits  POTASSIUM - Abnormal; Notable for the following:    Potassium 5.7 (*)    All other components within normal limits  I-STAT TROPOININ, ED    Imaging Review Dg Abd Acute W/chest  02/12/2015   CLINICAL DATA:  Acute onset of near-syncope. Dizziness, nausea and vomiting. Generalized weakness. Initial encounter.  EXAM: DG ABDOMEN ACUTE W/ 1V CHEST  COMPARISON:  None.  FINDINGS: The lungs are well-aerated and clear. There is no evidence of focal opacification, pleural effusion or pneumothorax. The cardiomediastinal silhouette is borderline normal in size.  The visualized bowel gas pattern is unremarkable. Mild distention of a  single loop of small bowel at the mid abdomen is thought to be transient in nature, without evidence of obstruction. Scattered stool and air are seen within the colon;. No free intra-abdominal air is identified on the provided upright view.  No acute osseous abnormalities are seen; the sacroiliac joints are unremarkable in appearance.  IMPRESSION: 1. Unremarkable bowel gas pattern; no free intra-abdominal air seen. 2. No acute cardiopulmonary process seen.   Electronically Signed   By: Garald Balding M.D.   On: 02/12/2015 21:47   I have personally reviewed and evaluated these images and lab results as part of my medical decision-making.   EKG Interpretation   Date/Time:  Sunday February 12 2015 18:10:51 EDT Ventricular Rate:  72 PR Interval:  175 QRS Duration: 85 QT Interval:  405 QTC Calculation: 443 R Axis:   50 Text Interpretation:  Sinus rhythm Peaked t-waves No prior for comparison  Confirmed by Maneh Sieben MD, Loriana Samad [phone]) on 02/12/2015 7:59:34 PM     CRITICAL CARE Performed by: Forde Dandy   Total critical care time: 30 minutes  Critical care time was exclusive of separately billable procedures and treating other patients.  Critical care was necessary to treat or prevent imminent or life-threatening deterioration.  Critical care was time spent personally by me on the following activities: management of hyperkalemia,  development of treatment plan with patient and/or surrogate as well as nursing, discussions with consultants, evaluation of patient's response to treatment, examination of patient, obtaining history from patient or surrogate, ordering and performing treatments and interventions, ordering and review of laboratory studies, ordering and review of radiographic studies, pulse oximetry and re-evaluation of patient's condition.  MDM   Final diagnoses:  AKI (acute kidney injury)  Urinary retention  Hyperkalemia   in short this 69 year old male postop day 5 from TURP procedure who presents with dizziness, nausea, vomiting, difficulty urinating. He is nontoxic and in no acute distress on presentation. Vital signs are within normal limits. The description of the symptoms seem most consistent with vertigo and not lightheadedness. His neurological exam is intact, no concerning findings that would be concerning for central vertigo. He is given IV fluids, anti-emetics, and meclizine, with improved symptoms and he is able to ambulate here. He also is noted to have urinary retention of 400 mL of urine, and Foley catheter was placed. He is noted to have acute on chronic kidney injury, with elevated creatinine to 3.7 and hyperkalemia of 6.7. He does have some peaked T waves on his EKG. This is likely both due to prerenal injury secondary to nausea and vomiting as well as obstructive in nature in the setting of his urinary retention. Given calcium gluconate, insulin and glucose, for temporizing measures. Given IV fluids. Discussed with [facility], and admitted for ongoing  management.    Forde Dandy, MD 02/13/15 0020  Forde Dandy, MD 02/13/15 0040

## 2015-02-12 NOTE — ED Notes (Signed)
Bed: [id number] Expected date:  Expected time:  Means of arrival:  Comments: EMS- 69yo M, possible septic

## 2015-02-13 DIAGNOSIS — N184 Chronic kidney disease, stage 4 (severe): Secondary | ICD-10-CM

## 2015-02-13 DIAGNOSIS — R338 Other retention of urine: Secondary | ICD-10-CM

## 2015-02-13 LAB — GLUCOSE, CAPILLARY
GLUCOSE-CAPILLARY: 201 mg/dL — AB (ref 65–99)
GLUCOSE-CAPILLARY: 103 mg/dL — AB (ref 65–99)
GLUCOSE-CAPILLARY: 121 mg/dL — AB (ref 65–99)
GLUCOSE-CAPILLARY: 103 mg/dL — AB (ref 65–99)
GLUCOSE-CAPILLARY: 97 mg/dL (ref 65–99)
GLUCOSE-CAPILLARY: 101 mg/dL — AB (ref 65–99)
GLUCOSE-CAPILLARY: 107 mg/dL — AB (ref 65–99)
Glucose-Capillary: 146 mg/dL — ABNORMAL HIGH (ref 65–99)
Glucose-Capillary: 159 mg/dL — ABNORMAL HIGH (ref 65–99)
Glucose-Capillary: 135 mg/dL — ABNORMAL HIGH (ref 65–99)
Glucose-Capillary: 82 mg/dL (ref 65–99)
Glucose-Capillary: 93 mg/dL (ref 65–99)
Glucose-Capillary: 98 mg/dL (ref 65–99)

## 2015-02-13 LAB — CBC
HEMATOCRIT: 33.2 % — AB (ref 39.0–52.0)
Hemoglobin: 10.8 g/dL — ABNORMAL LOW (ref 13.0–17.0)
MCH: 31.4 pg (ref 26.0–34.0)
MCHC: 32.5 g/dL (ref 30.0–36.0)
MCV: 96.5 fL (ref 78.0–100.0)
Platelets: 215 10*3/uL (ref 150–400)
RBC: 3.44 MIL/uL — AB (ref 4.22–5.81)
RDW: 13.1 % (ref 11.5–15.5)
WBC: 9 10*3/uL (ref 4.0–10.5)

## 2015-02-13 LAB — BASIC METABOLIC PANEL
ANION GAP: 8 (ref 5–15)
BUN: 38 mg/dL — AB (ref 6–20)
CO2: 20 mmol/L — ABNORMAL LOW (ref 22–32)
Calcium: 8.7 mg/dL — ABNORMAL LOW (ref 8.9–10.3)
Chloride: 111 mmol/L (ref 101–111)
Creatinine, Ser: 3.48 mg/dL — ABNORMAL HIGH (ref 0.61–1.24)
GFR, EST AFRICAN AMERICAN: 19 mL/min — AB (ref >60)
GFR, EST NON AFRICAN AMERICAN: 17 mL/min — AB (ref >60)
Glucose, Bld: 99 mg/dL (ref 65–99)
POTASSIUM: 5 mmol/L (ref 3.5–5.1)
SODIUM: 139 mmol/L (ref 135–145)

## 2015-02-13 MED ORDER — OXYCODONE HCL 5 MG PO TABS: 5.0000 mg | ORAL_TABLET | ORAL | Status: DC | PRN

## 2015-02-13 MED ORDER — DEXTROSE-NACL 5-0.9 % IV SOLN: INTRAVENOUS | Status: DC

## 2015-02-13 MED ORDER — HYDROMORPHONE HCL 1 MG/ML IJ SOLN
0.5000 mg | INTRAMUSCULAR | Status: DC | PRN
  Administered 2015-02-13: 1 mg via INTRAVENOUS
  Filled 2015-02-13: qty 1

## 2015-02-13 MED ORDER — AMLODIPINE BESYLATE 10 MG PO TABS
10.0000 mg | ORAL_TABLET | Freq: Every day | ORAL | Status: DC
  Administered 2015-02-13 – 2015-02-15 (×3): 10 mg via ORAL
  Filled 2015-02-13 (×3): qty 1

## 2015-02-13 MED ORDER — SENNOSIDES-DOCUSATE SODIUM 8.6-50 MG PO TABS
1.0000 | ORAL_TABLET | Freq: Every day | ORAL | Status: DC
  Administered 2015-02-13 – 2015-02-15 (×3): 1 via ORAL
  Filled 2015-02-13 (×5): qty 1

## 2015-02-13 MED ORDER — INFLUENZA VAC SPLIT QUAD 0.5 ML IM SUSY
0.5000 mL | PREFILLED_SYRINGE | INTRAMUSCULAR | Status: AC
  Administered 2015-02-14: 0.5 mL via INTRAMUSCULAR
  Filled 2015-02-13 (×2): qty 0.5

## 2015-02-13 MED ORDER — MAGNESIUM 250 MG PO TABS: 1.0000 | ORAL_TABLET | Freq: Every day | ORAL | Status: DC

## 2015-02-13 MED ORDER — NITROGLYCERIN 0.4 MG SL SUBL: 0.4000 mg | SUBLINGUAL_TABLET | SUBLINGUAL | Status: DC | PRN

## 2015-02-13 MED ORDER — FEBUXOSTAT 40 MG PO TABS
40.0000 mg | ORAL_TABLET | Freq: Every day | ORAL | Status: DC
  Administered 2015-02-13 – 2015-02-15 (×3): 40 mg via ORAL
  Filled 2015-02-13 (×3): qty 1

## 2015-02-13 MED ORDER — ONDANSETRON HCL 4 MG/2ML IJ SOLN: 4.0000 mg | Freq: Four times a day (QID) | INTRAMUSCULAR | Status: DC | PRN

## 2015-02-13 MED ORDER — HYDRALAZINE HCL 50 MG PO TABS
50.0000 mg | ORAL_TABLET | Freq: Three times a day (TID) | ORAL | Status: DC
  Administered 2015-02-13 – 2015-02-15 (×7): 50 mg via ORAL
  Filled 2015-02-13 (×7): qty 1

## 2015-02-13 MED ORDER — MAGNESIUM OXIDE 400 (241.3 MG) MG PO TABS
200.0000 mg | ORAL_TABLET | Freq: Every day | ORAL | Status: DC
  Administered 2015-02-13 – 2015-02-15 (×3): 200 mg via ORAL
  Filled 2015-02-13 (×4): qty 1

## 2015-02-13 MED ORDER — ALUM & MAG HYDROXIDE-SIMETH 200-200-20 MG/5ML PO SUSP
30.0000 mL | Freq: Four times a day (QID) | ORAL | Status: DC | PRN
  Administered 2015-02-13: 30 mL via ORAL
  Filled 2015-02-13: qty 30

## 2015-02-13 MED ORDER — NEBIVOLOL HCL 5 MG PO TABS
5.0000 mg | ORAL_TABLET | Freq: Every day | ORAL | Status: DC
  Administered 2015-02-13 – 2015-02-15 (×3): 5 mg via ORAL
  Filled 2015-02-13 (×3): qty 1

## 2015-02-13 MED ORDER — ONDANSETRON HCL 4 MG PO TABS: 4.0000 mg | ORAL_TABLET | Freq: Four times a day (QID) | ORAL | Status: DC | PRN

## 2015-02-13 MED ORDER — ACETAMINOPHEN 650 MG RE SUPP: 650.0000 mg | Freq: Four times a day (QID) | RECTAL | Status: DC | PRN

## 2015-02-13 MED ORDER — ACETAMINOPHEN 325 MG PO TABS
650.0000 mg | ORAL_TABLET | Freq: Four times a day (QID) | ORAL | Status: DC | PRN
  Administered 2015-02-14: 325 mg via ORAL
  Filled 2015-02-13: qty 2

## 2015-02-13 MED ORDER — ATORVASTATIN CALCIUM 10 MG PO TABS
20.0000 mg | ORAL_TABLET | Freq: Every day | ORAL | Status: DC
  Administered 2015-02-13 – 2015-02-15 (×3): 20 mg via ORAL
  Filled 2015-02-13 (×3): qty 2

## 2015-02-13 NOTE — Progress Notes (Signed)
PROGRESS NOTE    Joshua Thompson [id number] DOB: [birth year] DOA: 02/12/2015 PCP: Merrilee Seashore, MD   Outpatient specialists: Cardiology: Dr. Thamas Jaegers Nephrology: Dr. Pearson Grippe Urology: Dr. Carlota Raspberry & Dr.Ashok Hemal in [place]  HPI/Brief narrative 69 y.o. male with a history of CAD, HTN, Stage III CKD, and BPH S/P TURP x 4 , and recent TURP 5 days ago at [facility] who presents to the ED with complaints of worsening Urinary retention since foley catheter removal 2 days ago, along with ABD pain, Nausea and Vomiting and Dizziness today. He reports having hematuria since removal of the foley and straining to have urine flow. He reports not having any urine output since 10 AM. He reports that he began to have dizziness this afternoon which occurred when he turns his head. His potassium level and BUNCr were elevated at 5.7 and 42/3.67 respectively. A foley catheter was placed in the ED.  Assessment/Plan:  Acute urinary retention - Likely secondary to recent TURP  -  discussed with urologist on 02/13/15 who recommended keeping Foley catheter at discharge until outpatient follow-up with his primary urologist.  - Flomax started   Acute on stage III - IV chronic kidney disease - Baseline creatinine may be in the 3.3 range. Admitted with creatinine of 3.67. Improved after hydration to 3.48. - States that he has not been regularly following up with nephrology and is looking to change nephrologists. - Advised him that he will need close outpatient follow-up because he may be heading towards dialysis soon. He verbalized understanding.  Hyperkalemia - Secondary to acute renal failure. - Resolved after treating urinary retention, IV fluids and insulin dextrose in ED. Follow BMP in a.m.  Vertigo - Unclear etiology.? Precipitated by metabolic abnormalities. No focal deficits. Seems to have resolved.  Nausea and vomiting - Secondary to  acute illness. Seems to have resolved.  Essential hypertension - Mildly uncontrolled. Continue Hydralazine, Bystolic and Amlodipine  CAD  - Asymptomatic. Continue beta blockers and statins.  Anemia - Likely dilutional. Follow CBC in a.m.   DVT prophylaxis: Lovenox Code Status: Full Family Communication: None at bedside Disposition Plan: DC home possibly 8/30.    Consultants:  None  Procedures:  Foley catheter   Antibiotics:  None    Subjective: Patient feels much better. No further vertigo, nausea or vomiting. Denies abdominal pain.   Objective: Filed Vitals:   02/12/15 2350 02/13/15 0445 02/13/15 0905 02/13/15 1435  BP: 155/66 149/51 130/54 140/64  Pulse: 86 65  80  Temp: 97.5 F (36.4 C) 98.2 F (36.8 C)  97.8 F (36.6 C)  TempSrc: Oral Oral  Oral  Resp: 18 18  20   Height: 5\' 7"  (1.702 m)     Weight: 76.703 kg (169 lb 1.6 oz)     SpO2: 100% 100%  100%    Intake/Output Summary (Last 24 hours) at 02/13/15 1747 Last data filed at 02/13/15 1700  Gross per 24 hour  Intake 1888.75 ml  Output   3650 ml  Net -1761.25 ml   Filed Weights   02/12/15 1826 02/12/15 2350  Weight: 70.308 kg (155 lb) 76.703 kg (169 lb 1.6 oz)     Exam:  General exam: Pleasant middle-aged male lying comfortably supine in bed. Respiratory system: Clear. No increased work of breathing. Cardiovascular system: S1 & S2 heard, RRR. No JVD, murmurs, gallops, clicks or pedal edema. Gastrointestinal system: Abdomen is nondistended, soft and nontender. Normal bowel sounds heard. Foley catheter draining clear straw-colored urine.  Central nervous system: Alert and oriented. No focal neurological deficits. Extremities: Symmetric 5 x 5 power.   Data Reviewed: Basic Metabolic Panel:  Recent Labs Lab 02/12/15 1859 02/12/15 2015 02/13/15 0447  NA 135  --  139  K 6.3* 5.7* 5.0  CL 109  --  111  CO2 20*  --  20*  GLUCOSE 150*  --  99  BUN 42*  --  38*  CREATININE 3.67*  --  3.48*    CALCIUM 9.1  --  8.7*   Liver Function Tests:  Recent Labs Lab 02/12/15 1859  AST 23  ALT 17  ALKPHOS 69  BILITOT 0.6  PROT 7.7  ALBUMIN 4.4   No results for input(s): LIPASE, AMYLASE in the last 168 hours. No results for input(s): AMMONIA in the last 168 hours. CBC:  Recent Labs Lab 02/12/15 1859 02/13/15 0447  WBC 8.8 9.0  NEUTROABS 8.0*  --   HGB 12.3* 10.8*  HCT 38.3* 33.2*  MCV 95.8 96.5  PLT 218 215   Cardiac Enzymes: No results for input(s): CKTOTAL, CKMB, CKMBINDEX, TROPONINI in the last 168 hours. BNP (last 3 results) No results for input(s): PROBNP in the last 8760 hours. CBG:  Recent Labs Lab 02/13/15 0746 02/13/15 0845 02/13/15 0958 02/13/15 1128 02/13/15 1550  GLUCAP 135* 121* 103* 97 101*    No results found for this or any previous visit (from the past 240 hour(s)).        Studies: Dg Abd Acute W/chest  02/12/2015   CLINICAL DATA:  Acute onset of near-syncope. Dizziness, nausea and vomiting. Generalized weakness. Initial encounter.  EXAM: DG ABDOMEN ACUTE W/ 1V CHEST  COMPARISON:  None.  FINDINGS: The lungs are well-aerated and clear. There is no evidence of focal opacification, pleural effusion or pneumothorax. The cardiomediastinal silhouette is borderline normal in size.  The visualized bowel gas pattern is unremarkable. Mild distention of a single loop of small bowel at the mid abdomen is thought to be transient in nature, without evidence of obstruction. Scattered stool and air are seen within the colon;. No free intra-abdominal air is identified on the provided upright view.  No acute osseous abnormalities are seen; the sacroiliac joints are unremarkable in appearance.  IMPRESSION: 1. Unremarkable bowel gas pattern; no free intra-abdominal air seen. 2. No acute cardiopulmonary process seen.   Electronically Signed   By: Garald Balding M.D.   On: 02/12/2015 21:47        Scheduled Meds: . amLODipine  10 mg Oral Daily  . atorvastatin   20 mg Oral Daily  . febuxostat  40 mg Oral Daily  . hydrALAZINE  50 mg Oral TID  . [START ON 02/14/2015] Influenza vac split quadrivalent PF  0.5 mL Intramuscular Tomorrow-1000  . magnesium oxide  200 mg Oral Daily  . nebivolol  5 mg Oral Daily  . senna-docusate  1 tablet Oral Daily   Continuous Infusions:    Principal Problem:   Hyperkalemia Active Problems:   Essential hypertension, benign   Coronary atherosclerosis of native coronary artery   Urinary retention   CKD (chronic kidney disease), stage III   AKI (acute kidney injury)   Vertigo    Time spent:  69 minutes  Joshua Lange, MD, FACP, FHM. [facility] Pager [phone]  If 7PM-7AM, please contact night-coverage [email or web address] Password [facility] 02/13/2015, 5:47 PM    LOS: 1 day

## 2015-02-14 LAB — BASIC METABOLIC PANEL
Anion gap: 8 (ref 5–15)
Anion gap: 7 (ref 5–15)
BUN: 40 mg/dL — AB (ref 6–20)
BUN: 36 mg/dL — AB (ref 6–20)
CALCIUM: 8.6 mg/dL — AB (ref 8.9–10.3)
CO2: 22 mmol/L (ref 22–32)
CO2: 20 mmol/L — AB (ref 22–32)
CREATININE: 3.23 mg/dL — AB (ref 0.61–1.24)
Calcium: 9 mg/dL (ref 8.9–10.3)
Chloride: 111 mmol/L (ref 101–111)
Chloride: 111 mmol/L (ref 101–111)
Creatinine, Ser: 2.9 mg/dL — ABNORMAL HIGH (ref 0.61–1.24)
GFR calc Af Amer: 21 mL/min — ABNORMAL LOW (ref >60)
GFR calc Af Amer: 24 mL/min — ABNORMAL LOW (ref >60)
GFR, EST NON AFRICAN AMERICAN: 18 mL/min — AB (ref >60)
GFR, EST NON AFRICAN AMERICAN: 21 mL/min — AB (ref >60)
GLUCOSE: 95 mg/dL (ref 65–99)
GLUCOSE: 133 mg/dL — AB (ref 65–99)
Potassium: 5.7 mmol/L — ABNORMAL HIGH (ref 3.5–5.1)
Potassium: 4.5 mmol/L (ref 3.5–5.1)
SODIUM: 141 mmol/L (ref 135–145)
Sodium: 138 mmol/L (ref 135–145)

## 2015-02-14 LAB — GLUCOSE, CAPILLARY
GLUCOSE-CAPILLARY: 80 mg/dL (ref 65–99)
GLUCOSE-CAPILLARY: 105 mg/dL — AB (ref 65–99)
GLUCOSE-CAPILLARY: 117 mg/dL — AB (ref 65–99)
Glucose-Capillary: 130 mg/dL — ABNORMAL HIGH (ref 65–99)
Glucose-Capillary: 87 mg/dL (ref 65–99)
Glucose-Capillary: 88 mg/dL (ref 65–99)

## 2015-02-14 LAB — CBC
HCT: 35.1 % — ABNORMAL LOW (ref 39.0–52.0)
Hemoglobin: 11.2 g/dL — ABNORMAL LOW (ref 13.0–17.0)
MCH: 31.2 pg (ref 26.0–34.0)
MCHC: 31.9 g/dL (ref 30.0–36.0)
MCV: 97.8 fL (ref 78.0–100.0)
PLATELETS: 206 10*3/uL (ref 150–400)
RBC: 3.59 MIL/uL — ABNORMAL LOW (ref 4.22–5.81)
RDW: 13.1 % (ref 11.5–15.5)
WBC: 7.2 10*3/uL (ref 4.0–10.5)

## 2015-02-14 MED ORDER — DEXTROSE-NACL 5-0.9 % IV SOLN
INTRAVENOUS | Status: AC
  Administered 2015-02-14: 20:00:00 via INTRAVENOUS
  Administered 2015-02-14: 100 mL/h via INTRAVENOUS
  Administered 2015-02-15: 05:00:00 via INTRAVENOUS

## 2015-02-14 MED ORDER — SODIUM POLYSTYRENE SULFONATE 15 GM/60ML PO SUSP
45.0000 g | Freq: Once | ORAL | Status: AC
  Administered 2015-02-14: 45 g via ORAL
  Filled 2015-02-14: qty 180

## 2015-02-14 NOTE — Progress Notes (Signed)
PROGRESS NOTE    Joshua Thompson [id number] DOB: 01/18/1946 DOA: 02/12/2015 PCP: Merrilee Seashore, MD   Outpatient specialists: Cardiology: Dr. Thamas Jaegers Nephrology: Dr. Pearson Grippe Urology: Dr. Carlota Raspberry & Dr.Ashok Hemal in [place]  HPI/Brief narrative 69 y.o. male with a history of CAD, HTN, Stage III CKD, and BPH S/P TURP x 4 , and recent TURP 5 days ago at [facility] who presents to the ED with complaints of worsening Urinary retention since foley catheter removal 2 days ago, along with ABD pain, Nausea and Vomiting and Dizziness today. He reports having hematuria since removal of the foley and straining to have urine flow. He reports not having any urine output since 10 AM. He reports that he began to have dizziness this afternoon which occurred when he turns his head. His potassium level and BUNCr were elevated at 5.7 and 42/3.67 respectively. A foley catheter was placed in the ED.creatinine improving but had hyperkalemia (potassium 5.7) again 8/30. DC home when potassium stabilizes-possibly 8/31.  Assessment/Plan:  Acute urinary retention - Likely secondary to recent TURP  -  discussed with urologist on call on 02/13/15 who recommended keeping Foley catheter at discharge until outpatient follow-up with his primary urologist.  - Patient states that he was able to contact his urologist Dr.Badlani who advised him that he can remove the Foley catheter on Thursday 02/16/15 a.m. and see him later that afternoon.  Acute on stage III - IV chronic kidney disease - Baseline creatinine may be in the 3.3 range. Admitted with creatinine of 3.67. - States that he has not been regularly following up with nephrology and is looking to change nephrologists. - Advised him that he will need close outpatient follow-up because he may be heading towards dialysis. He verbalized understanding. - Creatinine continues to gradually  improve.  Hyperkalemia - Secondary to acute renal failure. - Had resolved on 8/29 but potassium this morning again is 5.7. Continue IV fluids. Kayexalate 1 dose. Follow-up BMP this afternoon and in a.m.? RTA 4. Change diet to renal diet. - As per discussion with nursing and admitting M.D. on 8/29, patient inadvertently received IV Lantus instead of regular insulin to treat hyperkalemia (in ED). His CBGs were closely monitored while he was on dextrose IV infusion. He did not have hypoglycemic spells. He should be out of window for hypoglycemic complications. Patient was updated regarding inadvertent error and he verbalized understanding. As per admitting M.D., safety zone portal was filed on admission to further investigate the incident.  Vertigo - Unclear etiology.? Precipitated by metabolic abnormalities. No focal deficits. Resolved.  Nausea and vomiting - Secondary to acute illness. Resolved.  Essential hypertension - Mildly uncontrolled. Continue Hydralazine, Bystolic and Amlodipine  CAD  - Asymptomatic. Continue beta blockers and statins.  Anemia - Stable   DVT prophylaxis: Lovenox Code Status: Full Family Communication: None at bedside Disposition Plan: DC home possibly 8/31.   Consultants:  None  Procedures:  Foley catheter   Antibiotics:  None    Subjective: Denies complaints.  Objective: Filed Vitals:   02/13/15 2138 02/13/15 2252 02/14/15 0430 02/14/15 1352  BP: 130/61 141/55 148/68 148/73  Pulse: 79 79 86 80  Temp: 98.4 F (36.9 C)  98.2 F (36.8 C) 98.3 F (36.8 C)  TempSrc: Oral  Oral Oral  Resp: 20  20 18   Height:      Weight:      SpO2: 98%  98% 98%    Intake/Output Summary (Last 24 hours) at 02/14/15  [place] filed at 02/14/15 1055  Gross per 24 hour  Intake    240 ml  Output   3950 ml  Net  -3710 ml   Filed Weights   02/12/15 1826 02/12/15 2350  Weight: 70.308 kg (155 lb) 76.703 kg (169 lb 1.6 oz)     Exam:  General  exam: Pleasant middle-aged male lying comfortably supine in bed. Respiratory system: Clear. No increased work of breathing. Cardiovascular system: S1 & S2 heard, RRR. No JVD, murmurs, gallops, clicks or pedal edema. Telemetry: Sinus rhythm-SB >55 Gastrointestinal system: Abdomen is nondistended, soft and nontender. Normal bowel sounds heard. Foley catheter draining clear straw-colored urine.  Central nervous system: Alert and oriented. No focal neurological deficits. Extremities: Symmetric 5 x 5 power.   Data Reviewed: Basic Metabolic Panel:  Recent Labs Lab 02/12/15 1859 02/12/15 2015 02/13/15 0447 02/14/15 0621  NA 135  --  139 141  K 6.3* 5.7* 5.0 5.7*  CL 109  --  111 111  CO2 20*  --  20* 22  GLUCOSE 150*  --  99 95  BUN 42*  --  38* 40*  CREATININE 3.67*  --  3.48* 3.23*  CALCIUM 9.1  --  8.7* 9.0   Liver Function Tests:  Recent Labs Lab 02/12/15 1859  AST 23  ALT 17  ALKPHOS 69  BILITOT 0.6  PROT 7.7  ALBUMIN 4.4   No results for input(s): LIPASE, AMYLASE in the last 168 hours. No results for input(s): AMMONIA in the last 168 hours. CBC:  Recent Labs Lab 02/12/15 1859 02/13/15 0447 02/14/15 0621  WBC 8.8 9.0 7.2  NEUTROABS 8.0*  --   --   HGB 12.3* 10.8* 11.2*  HCT 38.3* 33.2* 35.1*  MCV 95.8 96.5 97.8  PLT 218 215 206   Cardiac Enzymes: No results for input(s): CKTOTAL, CKMB, CKMBINDEX, TROPONINI in the last 168 hours. BNP (last 3 results) No results for input(s): PROBNP in the last 8760 hours. CBG:  Recent Labs Lab 02/13/15 2025 02/13/15 2359 02/14/15 0446 02/14/15 0757 02/14/15 1202  GLUCAP 107* 87 80 88 105*    No results found for this or any previous visit (from the past 240 hour(s)).        Studies: Dg Abd Acute W/chest  02/12/2015   CLINICAL DATA:  Acute onset of near-syncope. Dizziness, nausea and vomiting. Generalized weakness. Initial encounter.  EXAM: DG ABDOMEN ACUTE W/ 1V CHEST  COMPARISON:  None.  FINDINGS: The lungs  are well-aerated and clear. There is no evidence of focal opacification, pleural effusion or pneumothorax. The cardiomediastinal silhouette is borderline normal in size.  The visualized bowel gas pattern is unremarkable. Mild distention of a single loop of small bowel at the mid abdomen is thought to be transient in nature, without evidence of obstruction. Scattered stool and air are seen within the colon;. No free intra-abdominal air is identified on the provided upright view.  No acute osseous abnormalities are seen; the sacroiliac joints are unremarkable in appearance.  IMPRESSION: 1. Unremarkable bowel gas pattern; no free intra-abdominal air seen. 2. No acute cardiopulmonary process seen.   Electronically Signed   By: Garald Balding M.D.   On: 02/12/2015 21:47        Scheduled Meds: . amLODipine  10 mg Oral Daily  . atorvastatin  20 mg Oral Daily  . febuxostat  40 mg Oral Daily  . hydrALAZINE  50 mg Oral TID  . magnesium oxide  200 mg Oral Daily  .  nebivolol  5 mg Oral Daily  . senna-docusate  1 tablet Oral Daily   Continuous Infusions: . dextrose 5 % and 0.9% NaCl 100 mL/hr (02/14/15 0936)    Principal Problem:   Hyperkalemia Active Problems:   Essential hypertension, benign   Coronary atherosclerosis of native coronary artery   Urinary retention   CKD (chronic kidney disease), stage III   AKI (acute kidney injury)   Vertigo    Time spent:  20 minutes  Dusty Wagoner, MD, FACP, FHM. [facility] Pager [phone]  If 7PM-7AM, please contact night-coverage [email or web address] Password [email or web address] 02/14/2015, 3:37 PM    LOS: 2 days

## 2015-02-15 LAB — BASIC METABOLIC PANEL
Anion gap: 5 (ref 5–15)
BUN: 39 mg/dL — AB (ref 6–20)
CHLORIDE: 112 mmol/L — AB (ref 101–111)
CO2: 23 mmol/L (ref 22–32)
CREATININE: 3 mg/dL — AB (ref 0.61–1.24)
Calcium: 8.4 mg/dL — ABNORMAL LOW (ref 8.9–10.3)
GFR calc Af Amer: 23 mL/min — ABNORMAL LOW (ref >60)
GFR calc non Af Amer: 20 mL/min — ABNORMAL LOW (ref >60)
GLUCOSE: 99 mg/dL (ref 65–99)
POTASSIUM: 4.4 mmol/L (ref 3.5–5.1)
Sodium: 140 mmol/L (ref 135–145)

## 2015-02-15 LAB — GLUCOSE, CAPILLARY
Glucose-Capillary: 186 mg/dL — ABNORMAL HIGH (ref 65–99)
Glucose-Capillary: 95 mg/dL (ref 65–99)
Glucose-Capillary: 99 mg/dL (ref 65–99)

## 2015-02-15 LAB — URIC ACID: URIC ACID, SERUM: 3.6 mg/dL — AB (ref 4.4–7.6)

## 2015-02-15 MED ORDER — OXYCODONE HCL 5 MG PO TABS: 5.0000 mg | ORAL_TABLET | ORAL | Status: DC | PRN

## 2015-02-15 NOTE — Discharge Instructions (Signed)
Acute Kidney Injury Acute kidney injury is a disease in which there is sudden (acute) damage to the kidneys. The kidneys are 2 organs that lie on either side of the spine between the middle of the back and the front of the abdomen. The kidneys:  Remove wastes and extra water from the blood.   Produce important hormones. These help keep bones strong, regulate blood pressure, and help create red blood cells.   Balance the fluids and chemicals in the blood and tissues. A small amount of kidney damage may not cause problems, but a large amount of damage may make it difficult or impossible for the kidneys to work the way they should. Acute kidney injury may develop into long-lasting (chronic) kidney disease. It may also develop into a life-threatening disease called end-stage kidney disease. Acute kidney injury can get worse very quickly, so it should be treated right away. Early treatment may prevent other kidney diseases from developing.  CAUSES   A problem with blood flow to the kidneys. This may be caused by:   Blood loss.   Heart disease.   Severe burns.   Liver disease.  Direct damage to the kidneys. This may be caused by:  Some medicines.   A kidney infection.   Poisoning or consuming toxic substances.   A surgical wound.   A blow to the kidney area.   A problem with urine flow. This may be caused by:   Cancer.   Kidney stones.   An enlarged prostate. SYMPTOMS   Swelling (edema) of the legs, ankles, or feet.   Tiredness (lethargy).   Nausea or vomiting.   Confusion.   Problems with urination, such as:   Painful or burning feeling during urination.   Decreased urine production.   Frequent accidents in children who are potty trained.   Bloody urine.   Muscle twitches and cramps.   Shortness of breath.   Seizures.   Chest pain or pressure. Sometimes, no symptoms are present. DIAGNOSIS Acute kidney injury may be detected  and diagnosed by tests, including blood, urine, imaging, or kidney biopsy tests.  TREATMENT Treatment of acute kidney injury varies depending on the cause and severity of the kidney damage. In mild cases, no treatment may be needed. The kidneys may heal on their own. If acute kidney injury is more severe, your caregiver will treat the cause of the kidney damage, help the kidneys heal, and prevent complications from occurring. Severe cases may require a procedure to remove toxic wastes from the body (dialysis) or surgery to repair kidney damage. Surgery may involve:   Repair of a torn kidney.   Removal of an obstruction. Most of the time, you will need to stay overnight at the hospital.  HOME CARE INSTRUCTIONS:  Follow your prescribed diet.  Only take over-the-counter or prescription medicines as directed by your caregiver.  Do not take any new medicines (prescription, over-the-counter, or nutritional supplements) unless approved by your caregiver. Many medicines can worsen your kidney damage or need to have the dose adjusted.   Keep all follow-up appointments as directed by your caregiver.  Observe your condition to make sure you are healing as expected. SEEK IMMEDIATE MEDICAL CARE IF:  You are feeling ill or have severe pain in the back or side.   Your symptoms return or you have new symptoms.  You have any symptoms of end-stage kidney disease. These include:   Persistent itchiness.   Loss of appetite.   Headaches.   Abnormally dark   or light skin.  Numbness in the hands or feet.   Easy bruising.   Frequent hiccups.   Menstruation stops.   You have a fever.  You have increased urine production.  You have pain or bleeding when urinating. MAKE SURE YOU:   Understand these instructions.  Will watch your condition.  Will get help right away if you are not doing well or get worse Document Released: 12/17/2010 Document Revised: 09/28/2012 Document  Reviewed: 01/31/2012 Exit[employer] Patient Information 2015 [employer]. This information is not intended to replace advice given to you by your health care provider. Make sure you discuss any questions you have with your health care provider.  

## 2015-02-15 NOTE — Progress Notes (Signed)
Pt discharged home with belongings, IVs removed. Pt discharged home with foley, foley care taught, instructions given. Pt wheeled down in wheelchair, pt stable at time of discharge.

## 2015-02-15 NOTE — Discharge Summary (Signed)
Physician Discharge Summary  Joshua Thompson [id number] DOB: [birth year] DOA: 02/12/2015  PCP: Merrilee Seashore, MD  Admit date: 02/12/2015 Discharge date: 02/15/2015  Recommendations for Outpatient Follow-up:  1. Pt will need to follow up with PCP in 2-3 weeks post discharge 2. Please obtain BMP to evaluate electrolytes and kidney function  Discharge Diagnoses:  Principal Problem:   Hyperkalemia Active Problems:   Essential hypertension, benign   Coronary atherosclerosis of native coronary artery   Urinary retention   CKD (chronic kidney disease), stage III   AKI (acute kidney injury)   Vertigo   Discharge Condition: Stable  Diet recommendation: Heart healthy diet discussed in details   HPI/Brief narrative 69 y.o. male with a history of CAD, HTN, Stage III CKD, and BPH S/P TURP x 4 , and recent TURP 5 days ago at [facility] who presented to the ED with worsening Urinary retention since foley catheter removal 2 days PTA, along with ABD pain, Nausea and Vomiting and Dizziness. He reports having hematuria since removal of the foley and straining to have urine flow.  Assessment/Plan: Acute urinary retention - Likely secondary to recent TURP  - discussed with urologist on call on 02/13/15 who recommended keeping Foley catheter at discharge until outpatient follow-up with his primary urologist.  - Patient states that he was able to contact his urologist Dr.Badlani who advised him that he can remove the Foley catheter on Thursday 02/16/15 a.m. and see him later that afternoon. - pt stable for discharge   Acute on stage III - IV chronic kidney disease - Baseline creatinine may be in the 3.3 range. Admitted with creatinine of 3.67. - will need follow up   Hyperkalemia - Secondary to acute renal failure. - resolved   Vertigo - Unclear etiology.? Precipitated by metabolic abnormalities. No focal deficits. Resolved.  Nausea and vomiting -  Secondary to acute illness. Resolved.  Essential hypertension - Mildly uncontrolled. Continue Hydralazine, Bystolic and Amlodipine  CAD  - Asymptomatic. Continue beta blockers and statins.   Code Status: Full Family Communication: None at bedside Disposition Plan: DC home    Consultants:  None  Procedures:  Foley catheter   Discharge Exam: Filed Vitals:   02/15/15 0504  BP: 149/66  Pulse: 72  Temp: 98.3 F (36.8 C)  Resp: 16   Filed Vitals:   02/14/15 2051 02/14/15 2224 02/15/15 0043 02/15/15 0504  BP: 129/57 151/63  149/66  Pulse: 86 82  72  Temp: 98.3 F (36.8 C) 99.2 F (37.3 C) 98.5 F (36.9 C) 98.3 F (36.8 C)  TempSrc: Oral Oral Oral Oral  Resp: 18 18  16   Height:      Weight:      SpO2: 100% 99%  99%    General: Pt is alert, follows commands appropriately, not in acute distress Cardiovascular: Regular rate and rhythm, S1/S2 +, no murmurs, no rubs, no gallops Respiratory: Clear to auscultation bilaterally, no wheezing, no crackles, no rhonchi Abdominal: Soft, non tender, non distended, bowel sounds +, no guarding   Discharge Instructions  Discharge Instructions    Diet - low sodium heart healthy    Complete by:  As directed      Increase activity slowly    Complete by:  As directed             Medication List    STOP taking these medications        aspirin EC 81 MG tablet     HYDROcodone-acetaminophen 5-325 MG per tablet  Commonly known as:  NORCO/VICODIN      TAKE these medications        amLODipine 10 MG tablet  Commonly known as:  NORVASC  Take 1 tablet (10 mg total) by mouth daily.     atorvastatin 20 MG tablet  Commonly known as:  LIPITOR  Take 1 tablet (20 mg total) by mouth daily.     belladonna-opium 16.2-30 MG suppository  Commonly known as:  B&O SUPPRETTES  UNW AND I 1 SUP REC Q 8 H PRN     hydrALAZINE 50 MG tablet  Commonly known as:  APRESOLINE  Take 1 tablet (50 mg total) by mouth 3 (three) times daily.      Magnesium 250 MG Tabs  Take 1 tablet by mouth daily.     metoprolol tartrate 25 MG tablet  Commonly known as:  LOPRESSOR  Take 25 mg by mouth 2 (two) times daily as needed (high blood pressure).     nebivolol 5 MG tablet  Commonly known as:  BYSTOLIC  Take 1 tablet (5 mg total) by mouth daily.     nitroGLYCERIN 0.4 MG SL tablet  Commonly known as:  NITROSTAT  Place 1 tablet (0.4 mg total) under the tongue every 5 (five) minutes as needed for chest pain.     oxybutynin 5 MG tablet  Commonly known as:  DITROPAN  TK 1 T PO TID PRN     oxyCODONE 5 MG immediate release tablet  Commonly known as:  Oxy IR/ROXICODONE  Take 1 tablet (5 mg total) by mouth every 4 (four) hours as needed for moderate pain.     sennosides-docusate sodium 8.6-50 MG tablet  Commonly known as:  SENOKOT-S  Take 1 tablet by mouth daily.     ULORIC 40 MG tablet  Generic drug:  febuxostat  Take 40 mg by mouth daily.           Follow-up Information    Follow up with [facility], [place].   Specialty:  Internal Medicine   Contact information:   [address] [place] [address] [phone]        The results of significant diagnostics from this hospitalization (including imaging, microbiology, ancillary and laboratory) are listed below for reference.     Microbiology: No results found for this or any previous visit (from the past 240 hour(s)).   Labs: Basic Metabolic Panel:  Recent Labs Lab 02/12/15 1859 02/12/15 2015 02/13/15 0447 02/14/15 0621 02/14/15 1606 02/15/15 0454  NA 135  --  139 141 138 140  K 6.3* 5.7* 5.0 5.7* 4.5 4.4  CL 109  --  111 111 111 112*  CO2 20*  --  20* 22 20* 23  GLUCOSE 150*  --  99 95 133* 99  BUN 42*  --  38* 40* 36* 39*  CREATININE 3.67*  --  3.48* 3.23* 2.90* 3.00*  CALCIUM 9.1  --  8.7* 9.0 8.6* 8.4*   Liver Function Tests:  Recent Labs Lab 02/12/15 1859  AST 23  ALT 17  ALKPHOS 69  BILITOT 0.6  PROT 7.7  ALBUMIN 4.4    No results for input(s): LIPASE, AMYLASE in the last 168 hours. No results for input(s): AMMONIA in the last 168 hours. CBC:  Recent Labs Lab 02/12/15 1859 02/13/15 0447 02/14/15 0621  WBC 8.8 9.0 7.2  NEUTROABS 8.0*  --   --   HGB 12.3* 10.8* 11.2*  HCT 38.3* 33.2* 35.1*  MCV 95.8 96.5 97.8  PLT  218 215 206   Cardiac Enzymes: No results for input(s): CKTOTAL, CKMB, CKMBINDEX, TROPONINI in the last 168 hours. BNP: BNP (last 3 results) No results for input(s): BNP in the last 8760 hours.  ProBNP (last 3 results) No results for input(s): PROBNP in the last 8760 hours.  CBG:  Recent Labs Lab 02/14/15 1630 02/14/15 2050 02/15/15 0040 02/15/15 0455 02/15/15 0747  GLUCAP 117* 130* 186* 95 99     SIGNED: Time coordinating discharge: 30 minutes  MAGICK-Ariq Khamis, MD  [facility] 02/15/2015, 10:23 AM Pager [phone]  If 7PM-7AM, please contact night-coverage [email or web address] Password [email or web address]

## 2015-02-27 ENCOUNTER — Other Ambulatory Visit: Payer: Self-pay

## 2015-02-27 MED ORDER — HYDRALAZINE HCL 50 MG PO TABS: 50.0000 mg | ORAL_TABLET | Freq: Three times a day (TID) | ORAL | Status: DC

## 2015-02-27 NOTE — Telephone Encounter (Signed)
Rx sent per pt's request.  

## 2015-05-01 ENCOUNTER — Telehealth: Payer: Self-pay | Admitting: Interventional Cardiology

## 2015-05-01 MED ORDER — ATORVASTATIN CALCIUM 20 MG PO TABS: 20.0000 mg | ORAL_TABLET | Freq: Every day | ORAL | Status: DC

## 2015-05-01 MED ORDER — NEBIVOLOL HCL 5 MG PO TABS: 5.0000 mg | ORAL_TABLET | Freq: Every day | ORAL | Status: DC

## 2015-05-01 NOTE — Telephone Encounter (Signed)
°*  STAT* If patient is at the pharmacy, call can be transferred to refill team.   1. Which medications need to be refilled? (please list name of each medication and dose if known) Bystolic and Atorvastatin  2. Which pharmacy/location (including street and city if local pharmacy) is medication to be sent to? [facility] [address] in [place]  3. Do they need a 30 day or 90 day supply? 30 day

## 2015-05-01 NOTE — Telephone Encounter (Signed)
Pt's medication was sent to pt's pharmacy. Confirmation received °

## 2015-05-08 ENCOUNTER — Ambulatory Visit: Payer: 59 | Admitting: Cardiology

## 2015-06-23 ENCOUNTER — Telehealth: Payer: Self-pay

## 2015-06-23 ENCOUNTER — Telehealth: Payer: Self-pay | Admitting: Interventional Cardiology

## 2015-06-23 NOTE — Telephone Encounter (Signed)
New message      Pt c/o medication issue:  1. Name of Medication: bystolic 2. How are you currently taking this medication (dosage and times per day)? 5mg  3. Are you having a reaction (difficulty breathing--STAT)? no  4. What is your medication issue? Ins company will no longer pay for bystolic.  Will Dr Irish Lack want to change to something else

## 2015-06-23 NOTE — Telephone Encounter (Signed)
Follow up        Patient calling the office for samples of medication:   1.  What medication and dosage are you requesting samples for?  bystolic 5mg   2.  Are you currently out of this medication? Have a few pills left

## 2015-06-23 NOTE — Telephone Encounter (Signed)
Pt needing a prior auth for Bystolic 5 mg tablet. Please advise

## 2015-06-23 NOTE — Telephone Encounter (Signed)
Called pt to inform him that we do not have any samples of the Bystolic 5 mg tablet and that I am sending a message to our prior auth department to inform them that pt needs a prior auth on Bystolic. I advised the pt that if he has any other problems, questions or concerns to call the office. Pt verbalized understanding.

## 2015-06-23 NOTE — Telephone Encounter (Signed)
Prior auth for [employer] sent to [employer].

## 2015-06-23 NOTE — Telephone Encounter (Signed)
Sent to [employer].

## 2015-06-26 ENCOUNTER — Telehealth: Payer: Self-pay

## 2015-06-26 NOTE — Telephone Encounter (Signed)
Bystolic approved by [employer].

## 2015-06-27 ENCOUNTER — Telehealth: Payer: Self-pay | Admitting: Interventional Cardiology

## 2015-06-27 NOTE — Telephone Encounter (Signed)
Per phone note from yesterday, bystolic has already been approved. I spoke with patient and made him aware of this and asked him to call his pharmacy and have them run it through his insurance.

## 2015-06-27 NOTE — Telephone Encounter (Signed)
New message      Calling to check the status on prior approval for his bystolic.  Please call

## 2015-07-05 ENCOUNTER — Telehealth: Payer: Self-pay | Admitting: Interventional Cardiology

## 2015-07-05 NOTE — Telephone Encounter (Signed)
Pt states he picked up bystolic 5 mg and had to pay $80, please call him back so he can further discuss/ change. Pt aware dr/nurse out of office today and will return tomorrow.

## 2015-07-05 NOTE — Telephone Encounter (Signed)
New Message    Pt wants to have RN call him  He wants prescription Dysostotic to be changed

## 2015-07-06 MED ORDER — CARVEDILOL 6.25 MG PO TABS: 6.2500 mg | ORAL_TABLET | Freq: Two times a day (BID) | ORAL | Status: DC

## 2015-07-06 NOTE — Telephone Encounter (Signed)
Pt sts that he is not currently taking Carvedilol. Adv him that I will confirm the dosage of Carvedilol Dr.Varanasi would like for him to start and call him back.   Adv pt that per Dr.Varanasi pt should start Carvedilol 6.25mg  twice daily, when he completes his supply of Bystolic. Rx sent to pt pharmacy. [facility] on [address]. Pt voiced appreciation and verbalized understanding.

## 2015-07-06 NOTE — Telephone Encounter (Signed)
We can increase carvedilol to 25 mg BID and eliminate bystolic ... Since no generic for Bystolic available.

## 2015-07-27 NOTE — Progress Notes (Signed)
Patient ID: Joshua Thompson, male   DOB: 1946-03-05, 70 y.o.   MRN: [id number]     Cardiology Office Note   Date:  07/28/2015   ID:  [name], [name] 15-Oct-1945, MRN [id number]  PCP:  Merrilee Seashore, MD    No chief complaint on file. f/u HTN   Wt Readings from Last 3 Encounters:  07/28/15 166 lb 12.8 oz (75.66 kg)  02/12/15 169 lb 1.6 oz (76.703 kg)  11/15/14 162 lb 8 oz (73.71 kg)       History of Present Illness: Joshua Thompson is a 69 y.o. male  who has had HTN for about 15 years. In 2004, he had chest pain and then a stress test which was abnormal and then a DES (Taxus) was placed, while in [place]. He did well after that time.   In 2010, he had leg pain and kidney pain. He was gaining weight and feeling ill and he was retaining fluid. His BP was high. His creatinine had reached 1.5+. He also had prostatic obstruction leading to the urinary issues.   In 09/2012, he had HTN, urine retention and then he had a catheter placed for his bladder. TURP was recommended, but it was also noted that his BP was up. Another coronary lesion was found. He was in renal failure, Cr 4.5. He then had two bare stents placed. Two weeks later, he had a TURP which was unsuccessful. He had a bladder obstruction. He had a repeat TURP.   In August 2014, he and his wife Moved to [place]. He followed up with urology at [facility]. It was noted that there was an irregularity of the surface of the inside of the bladder. His Cr. Is 3.0 at baseline.  Nebivolol has worked better than Coreg in the past but was more expensive.  Hydralazine was recommended to be taken TID, but he did not take this.  Renal dysfunction has limited meds.  Edema improved on higher dose of amlodipine.  He had bladder obstruction and had TURP at [facility].  He has recovered well/.   Occasional shortness of breath when he wakes up at night and goes to the bathroom.  He has had some dizziness on  occasion in the afternoons.  He has had some episodes of feeling hte   He had some episodes of falling after his urologic surgery.  He was still on pain meds.      Past Medical History  Diagnosis Date  . Coronary artery disease   . Hypertension     Past Surgical History  Procedure Laterality Date  . Coronary angioplasty      3 STENTS  . Transurethral resection of prostate      HAD DONE THREE TIMES     Current Outpatient Prescriptions  Medication Sig Dispense Refill  . amLODipine (NORVASC) 10 MG tablet Take 1 tablet (10 mg total) by mouth daily. 30 tablet 6  . aspirin EC 81 MG tablet Take 81 mg by mouth daily.     Marland Kitchen atorvastatin (LIPITOR) 20 MG tablet Take 1 tablet (20 mg total) by mouth daily. 30 tablet 3  . belladonna-opium (B&O SUPPRETTES) 16.2-30 MG suppository UNW AND I 1 SUP REC Q 8 H PRN  0  . carvedilol (COREG) 6.25 MG tablet Take 1 tablet (6.25 mg total) by mouth 2 (two) times daily. 180 tablet 1  . febuxostat (ULORIC) 40 MG tablet Take 40 mg by mouth daily.    . fluticasone (FLONASE) 50 MCG/ACT nasal spray Place  into the nose.    . hydrALAZINE (APRESOLINE) 50 MG tablet Take 1 tablet (50 mg total) by mouth 3 (three) times daily. 90 tablet 5  . Magnesium 250 MG TABS Take 1 tablet by mouth daily.    . metoprolol tartrate (LOPRESSOR) 25 MG tablet Take 25 mg by mouth 2 (two) times daily as needed (high blood pressure).    . nitroGLYCERIN (NITROSTAT) 0.4 MG SL tablet Place 1 tablet (0.4 mg total) under the tongue every 5 (five) minutes as needed for chest pain. 25 tablet 5  . pantoprazole (PROTONIX) 40 MG tablet Take 40 mg by mouth.     No current facility-administered medications for this visit.    Allergies:   Amoxicillin-pot clavulanate and Ampicillin    Social History:  The patient  reports that he has never smoked. He does not have any smokeless tobacco history on file. He reports that he does not drink alcohol.   Family History:  The patient's family history  includes Hypertension in his mother; Stroke in his maternal grandfather. There is no history of Arrhythmia or Heart attack.    ROS:  Please see the history of present illness.   Otherwise, review of systems are positive for occasional back pain.   All other systems are reviewed and negative.    PHYSICAL EXAM: VS:  BP 130/70 mmHg  Pulse 80  Ht 5\' 7"  (1.702 m)  Wt 166 lb 12.8 oz (75.66 kg)  BMI 26.12 kg/m2 , BMI Body mass index is 26.12 kg/(m^2). GEN: Well nourished, well developed, in no acute distress HEENT: normal Neck: no JVD, carotid bruits, or masses Cardiac: RRR; no murmurs, rubs, or gallops,no edema  Respiratory:  clear to auscultation bilaterally, normal work of breathing GI: soft, nontender, nondistended, + BS MS: no deformity or atrophy Skin: warm and dry, no rash Neuro:  Strength and sensation are intact Psych: euthymic mood, full affect   EKG:   The ekg ordered in 8/16 was normal   Recent Labs: 02/12/2015: ALT 17 02/14/2015: Hemoglobin 11.2*; Platelets 206 02/15/2015: BUN 39*; Creatinine, Ser 3.00*; Potassium 4.4; Sodium 140   Lipid Panel    Component Value Date/Time   CHOL 149 06/15/2014 0931   TRIG 73.0 06/15/2014 0931   HDL 49.90 06/15/2014 0931   CHOLHDL 3 06/15/2014 0931   VLDL 14.6 06/15/2014 0931   LDLCALC 85 06/15/2014 0931     Other studies Reviewed: Additional studies/ records that were reviewed today with results demonstrating: prior lab results reviewed.  Cr 3 in the past, now 3.6.   ASSESSMENT AND PLAN:  1. CAD: h/o Taxus stent in 2004.  Bare metal stents in 2014.  We'll plan for nuclear stress test given his shortness of breath and dizzy spells. The dizziness may have been related to the pain medicines that he was taking postoperatively, but will rule out any large areas of ischemia. Cardiac cath would have to be approached cautiously given his significant renal insufficiency.  Of note, some of the dizziness that he describes may be more vertigo  type symptoms. Will rule out any significant coronary ischemia. 2. HTN: Well controlled today. Continue current medications. He is actually been taking his by systolic. He has only 2 pills left and then will change to carvedilol. He'll let us know if any changes with his blood pressure. 3. CKD: Creatinine is increased since his surgery. We'll check labs again. Followed by nephrology at [facility]. 4. Hyperlipidemia: We'll check lipids. It is been over a year since  labs were checked. 5. Hyperkalemia: His potassium is been elevated as well.  Will check at his next blood draw. He has to avoid salt substitutes to the hyperkalemia.   Current medicines are reviewed at length with the patient today.  The patient concerns regarding his medicines were addressed.  The following changes have been made:  No change  Labs/ tests ordered today include:  No orders of the defined types were placed in this encounter.    Recommend 150 minutes/week of aerobic exercise Low fat, low carb, high fiber diet recommended  Disposition:   FU in 6 months   Teresita Madura., MD  07/28/2015 12:11 PM    [facility] [place], [place], [place]  [address] Phone: [phone]; Fax: [phone]

## 2015-07-28 ENCOUNTER — Ambulatory Visit (INDEPENDENT_AMBULATORY_CARE_PROVIDER_SITE_OTHER): Payer: BLUE CROSS/BLUE SHIELD | Admitting: Interventional Cardiology

## 2015-07-28 ENCOUNTER — Encounter: Payer: Self-pay | Admitting: Interventional Cardiology

## 2015-07-28 VITALS — BP 130/70 | HR 80 | Ht 67.0 in | Wt 166.8 lb

## 2015-07-28 DIAGNOSIS — N183 Chronic kidney disease, stage 3 unspecified: Secondary | ICD-10-CM

## 2015-07-28 DIAGNOSIS — R42 Dizziness and giddiness: Secondary | ICD-10-CM

## 2015-07-28 DIAGNOSIS — I10 Essential (primary) hypertension: Secondary | ICD-10-CM

## 2015-07-28 DIAGNOSIS — E785 Hyperlipidemia, unspecified: Secondary | ICD-10-CM | POA: Diagnosis not present

## 2015-07-28 DIAGNOSIS — I2510 Atherosclerotic heart disease of native coronary artery without angina pectoris: Secondary | ICD-10-CM

## 2015-07-28 DIAGNOSIS — M1009 Idiopathic gout, multiple sites: Secondary | ICD-10-CM

## 2015-07-28 DIAGNOSIS — E875 Hyperkalemia: Secondary | ICD-10-CM

## 2015-07-28 DIAGNOSIS — M109 Gout, unspecified: Secondary | ICD-10-CM

## 2015-07-28 NOTE — Patient Instructions (Signed)
**Note De-Identified Joshua Thompson Obfuscation** Medication Instructions:  Same-no changes  Labwork: Lipids, CMET and Uric acid on Monday-07/31/15. Please do not eat or drink after midnight the night before.  Testing/Procedures: Your physician has requested that you have en exercise stress myoview. For further information please visit [email or web address]. Please follow instruction sheet, as given.   Follow-Up: Your physician wants you to follow-up in: 6 months. You will receive a reminder letter in the mail two months in advance. If you don't receive a letter, please call our office to schedule the follow-up appointment.      If you need a refill on your cardiac medications before your next appointment, please call your pharmacy.

## 2015-07-31 ENCOUNTER — Other Ambulatory Visit: Payer: BLUE CROSS/BLUE SHIELD

## 2015-07-31 ENCOUNTER — Other Ambulatory Visit (INDEPENDENT_AMBULATORY_CARE_PROVIDER_SITE_OTHER): Payer: BLUE CROSS/BLUE SHIELD | Admitting: *Deleted

## 2015-07-31 ENCOUNTER — Other Ambulatory Visit: Payer: Self-pay | Admitting: Interventional Cardiology

## 2015-07-31 DIAGNOSIS — M109 Gout, unspecified: Secondary | ICD-10-CM

## 2015-07-31 DIAGNOSIS — E785 Hyperlipidemia, unspecified: Secondary | ICD-10-CM | POA: Diagnosis not present

## 2015-07-31 LAB — COMPREHENSIVE METABOLIC PANEL
ALBUMIN: 4.1 g/dL (ref 3.6–5.1)
ALK PHOS: 69 U/L (ref 40–115)
ALT: 11 U/L (ref 9–46)
AST: 15 U/L (ref 10–35)
BILIRUBIN TOTAL: 0.4 mg/dL (ref 0.2–1.2)
BUN: 39 mg/dL — ABNORMAL HIGH (ref 7–25)
CO2: 21 mmol/L (ref 20–31)
CREATININE: 2.88 mg/dL — AB (ref 0.70–1.25)
Calcium: 9.1 mg/dL (ref 8.6–10.3)
Chloride: 111 mmol/L — ABNORMAL HIGH (ref 98–110)
Glucose, Bld: 91 mg/dL (ref 65–99)
Potassium: 5.3 mmol/L (ref 3.5–5.3)
SODIUM: 141 mmol/L (ref 135–146)
TOTAL PROTEIN: 7.1 g/dL (ref 6.1–8.1)

## 2015-07-31 LAB — LIPID PANEL
CHOLESTEROL: 123 mg/dL — AB (ref 125–200)
HDL: 41 mg/dL (ref >=40)
LDL Cholesterol: 69 mg/dL (ref <130)
Total CHOL/HDL Ratio: 3 Ratio (ref <=5.0)
Triglycerides: 66 mg/dL (ref <150)
VLDL: 13 mg/dL (ref <30)

## 2015-07-31 LAB — URIC ACID: Uric Acid, Serum: 4.3 mg/dL (ref 4.0–7.8)

## 2015-08-08 ENCOUNTER — Telehealth (HOSPITAL_COMMUNITY): Payer: Self-pay | Admitting: *Deleted

## 2015-08-08 NOTE — Telephone Encounter (Signed)
Patient given detailed instructions per Myocardial Perfusion Study Information Sheet for the test on 08/10/15 at 745 Patient notified to arrive 15 minutes early and that it is imperative to arrive on time for appointment to keep from having the test rescheduled.  If you need to cancel or reschedule your appointment, please call the office within 24 hours of your appointment. Failure to do so may result in a cancellation of your appointment, and a $50 no show fee. Patient verbalized understanding.Hubbard Robinson, RN

## 2015-08-10 ENCOUNTER — Ambulatory Visit (HOSPITAL_COMMUNITY): Payer: BLUE CROSS/BLUE SHIELD | Attending: Interventional Cardiology

## 2015-08-10 DIAGNOSIS — R0602 Shortness of breath: Secondary | ICD-10-CM | POA: Insufficient documentation

## 2015-08-10 DIAGNOSIS — I10 Essential (primary) hypertension: Secondary | ICD-10-CM | POA: Insufficient documentation

## 2015-08-10 DIAGNOSIS — M1009 Idiopathic gout, multiple sites: Secondary | ICD-10-CM | POA: Diagnosis not present

## 2015-08-10 DIAGNOSIS — M109 Gout, unspecified: Secondary | ICD-10-CM

## 2015-08-10 LAB — MYOCARDIAL PERFUSION IMAGING
CHL CUP MPHR: 152 {beats}/min
CHL CUP NUCLEAR SRS: 1
CHL CUP RESTING HR STRESS: 65 {beats}/min
CHL RATE OF PERCEIVED EXERTION: 18
CSEPED: 8 min
CSEPEDS: 45 s
CSEPEW: 10.1 METS
LVDIAVOL: 89 mL
LVSYSVOL: 30 mL
NUC STRESS TID: 0.87
Peak HR: 137 {beats}/min
Percent HR: 90 %
RATE: 0.33
SDS: 0
SSS: 0

## 2015-08-10 MED ORDER — TECHNETIUM TC 99M SESTAMIBI GENERIC - CARDIOLITE
31.6000 | Freq: Once | INTRAVENOUS | Status: AC | PRN
  Administered 2015-08-10: 32 via INTRAVENOUS

## 2015-08-10 MED ORDER — TECHNETIUM TC 99M SESTAMIBI GENERIC - CARDIOLITE
10.3000 | Freq: Once | INTRAVENOUS | Status: AC | PRN
  Administered 2015-08-10: 10 via INTRAVENOUS

## 2016-01-01 ENCOUNTER — Other Ambulatory Visit: Payer: Self-pay | Admitting: Interventional Cardiology

## 2016-01-02 ENCOUNTER — Other Ambulatory Visit: Payer: Self-pay

## 2016-01-02 MED ORDER — CARVEDILOL 6.25 MG PO TABS: 6.2500 mg | ORAL_TABLET | Freq: Two times a day (BID) | ORAL | Status: DC

## 2016-02-28 ENCOUNTER — Other Ambulatory Visit: Payer: Self-pay | Admitting: Interventional Cardiology

## 2016-04-02 ENCOUNTER — Other Ambulatory Visit: Payer: Self-pay | Admitting: Rheumatology

## 2016-04-02 DIAGNOSIS — M25562 Pain in left knee: Secondary | ICD-10-CM

## 2016-04-06 ENCOUNTER — Ambulatory Visit
Admission: RE | Admit: 2016-04-06 | Discharge: 2016-04-06 | Disposition: A | Discharge status: Home / Self Care | Payer: BLUE CROSS/BLUE SHIELD | Source: Ambulatory Visit | Attending: Rheumatology | Admitting: Rheumatology

## 2016-04-06 DIAGNOSIS — M25562 Pain in left knee: Secondary | ICD-10-CM

## 2016-06-20 ENCOUNTER — Other Ambulatory Visit: Payer: Self-pay | Admitting: Interventional Cardiology

## 2016-07-24 ENCOUNTER — Other Ambulatory Visit: Payer: Self-pay | Admitting: Internal Medicine

## 2016-07-24 DIAGNOSIS — M5441 Lumbago with sciatica, right side: Secondary | ICD-10-CM

## 2016-07-31 ENCOUNTER — Inpatient Hospital Stay
Admission: RE | Admit: 2016-07-31 | Discharge: 2016-07-31 | Disposition: A | Discharge status: Home / Self Care | Payer: BLUE CROSS/BLUE SHIELD | Source: Ambulatory Visit | Attending: Internal Medicine | Admitting: Internal Medicine

## 2016-08-01 ENCOUNTER — Ambulatory Visit
Admission: RE | Admit: 2016-08-01 | Discharge: 2016-08-01 | Disposition: A | Discharge status: Home / Self Care | Payer: BLUE CROSS/BLUE SHIELD | Source: Ambulatory Visit | Attending: Internal Medicine | Admitting: Internal Medicine

## 2016-08-01 DIAGNOSIS — M5441 Lumbago with sciatica, right side: Secondary | ICD-10-CM

## 2016-08-22 ENCOUNTER — Other Ambulatory Visit: Payer: Self-pay | Admitting: Interventional Cardiology

## 2016-09-20 ENCOUNTER — Encounter: Payer: Self-pay | Admitting: Interventional Cardiology

## 2016-09-23 ENCOUNTER — Other Ambulatory Visit: Payer: Self-pay | Admitting: Interventional Cardiology

## 2016-10-03 ENCOUNTER — Ambulatory Visit: Payer: BLUE CROSS/BLUE SHIELD | Admitting: Interventional Cardiology

## 2016-10-08 ENCOUNTER — Encounter: Payer: Self-pay | Admitting: Interventional Cardiology

## 2016-10-08 ENCOUNTER — Ambulatory Visit (INDEPENDENT_AMBULATORY_CARE_PROVIDER_SITE_OTHER): Payer: BLUE CROSS/BLUE SHIELD | Admitting: Interventional Cardiology

## 2016-10-08 VITALS — BP 152/70 | HR 71 | Ht 67.0 in | Wt 168.6 lb

## 2016-10-08 DIAGNOSIS — I25119 Atherosclerotic heart disease of native coronary artery with unspecified angina pectoris: Secondary | ICD-10-CM | POA: Diagnosis not present

## 2016-10-08 DIAGNOSIS — I25118 Atherosclerotic heart disease of native coronary artery with other forms of angina pectoris: Secondary | ICD-10-CM | POA: Diagnosis not present

## 2016-10-08 DIAGNOSIS — N183 Chronic kidney disease, stage 3 unspecified: Secondary | ICD-10-CM

## 2016-10-08 DIAGNOSIS — I10 Essential (primary) hypertension: Secondary | ICD-10-CM

## 2016-10-08 MED ORDER — HYDRALAZINE HCL 50 MG PO TABS: 100.0000 mg | ORAL_TABLET | Freq: Three times a day (TID) | ORAL | 1 refills | Status: DC

## 2016-10-08 MED ORDER — CARVEDILOL 12.5 MG PO TABS: 12.5000 mg | ORAL_TABLET | Freq: Two times a day (BID) | ORAL | 3 refills | Status: DC

## 2016-10-08 NOTE — Patient Instructions (Signed)
Medication Instructions:  Your physician has recommended you make the following change in your medication:  1. INCREASE Carvedilol to 12.5 mg twice a day.   Labwork: None ordered.  Testing/Procedures: None ordered.  Follow-Up: Your physician wants you to follow-up in: 1 year with Dr. Irish Lack. You will receive a reminder letter in the mail two months in advance. If you don't receive a letter, please call our office to schedule the follow-up appointment.   Any Other Special Instructions Will Be Listed Below (If Applicable).     If you need a refill on your cardiac medications before your next appointment, please call your pharmacy.

## 2016-10-08 NOTE — Progress Notes (Signed)
Patient ID: Joshua Thompson, male   DOB: [birth year], 71 y.o.   MRN: [id number]     Cardiology Office Note   Date:  10/08/2016   ID:  Joshua Thompson, Joshua Thompson 03-Mar-1946, MRN [id number]  PCP:  Joshua Seashore, MD    No chief complaint on file. f/u HTN   Wt Readings from Last 3 Encounters:  10/08/16 168 lb 9.6 oz (76.5 kg)  08/10/15 166 lb (75.3 kg)  07/28/15 166 lb 12.8 oz (75.7 kg)       History of Present Illness: Joshua Thompson is a 71 y.o. male  who has had HTN for about 15 years. In 2004, he had chest pain and then a stress test which was abnormal and then a DES (Taxus) was placed, while in [place]. He did well after that time.   In 2010, he had leg pain and kidney pain. He was gaining weight and feeling ill and he was retaining fluid. His BP was high. His creatinine had reached 1.5+. He also had prostatic obstruction leading to the urinary issues.   In 09/2012, he had HTN, urine retention and then he had a catheter placed for his bladder. TURP was recommended, but it was also noted that his BP was up. Another coronary lesion was found. He was in renal failure, Cr 4.5. He then had two bare stents placed. Two weeks later, he had a TURP which was unsuccessful. He had a bladder obstruction. He had a repeat TURP.   In August 2014, he and his wife Moved to [place]. He followed up with urology at [facility], [place]. It was noted that there was an irregularity of the surface of the inside of the bladder. His Cr. Is 3.0 at baseline.  Nebivolol has worked better than Coreg in the past but was more expensive.  He now takes carvedilol BID and metoprolol for prn increased BP.   Hydralazine was recommended to be taken TID, but he did not take this.  Renal dysfunction has limited meds.  THis has been represcribed by Dr. Ashby Thompson and he reports that he is compliant.  Edema only in the evenings.   He is on his feet a lot for work.   He had bladder obstruction and had TURP  at [facility] a few yeears ago.  He has recovered well.    Had some sensation in his chest that resolved with an herbal medicine.  Currently without chest pain.  He remains active at his job.  He reports that his blood pressure is benign. He also has back pain and leg pain. He underwent MRI. Steroid shot was recommended but he declined. In general, he feels well from a cardiovascular standpoint. He is trying to keep his blood pressure down to 18 his kidney function for as long as possible.    Past Medical History:  Diagnosis Date  . Coronary artery disease   . Hypertension     Past Surgical History:  Procedure Laterality Date  . CORONARY ANGIOPLASTY     3 STENTS  . TRANSURETHRAL RESECTION OF PROSTATE     HAD DONE THREE TIMES     Current Outpatient Prescriptions  Medication Sig Dispense Refill  . amLODipine (NORVASC) 10 MG tablet take 1 tablet by mouth once daily 15 tablet 0  . atorvastatin (LIPITOR) 20 MG tablet take 1 tablet by mouth once daily 15 tablet 0  . carvedilol (COREG) 6.25 MG tablet take 1 tablet by mouth twice a day 30 tablet 0  . fluticasone (FLONASE) 50 MCG/ACT  nasal spray Place into the nose.    . hydrALAZINE (APRESOLINE) 50 MG tablet Take 1 tablet (50 mg total) by mouth 3 (three) times daily. 90 tablet 1  . metoprolol tartrate (LOPRESSOR) 25 MG tablet Take 25 mg by mouth 2 (two) times daily as needed (high blood pressure).    . nitroGLYCERIN (NITROSTAT) 0.4 MG SL tablet Place 1 tablet (0.4 mg total) under the tongue every 5 (five) minutes as needed for chest pain. 25 tablet 5  . pantoprazole (PROTONIX) 40 MG tablet Take 40 mg by mouth.     No current facility-administered medications for this visit.     Allergies:   Amoxicillin-pot clavulanate and Ampicillin    Social History:  The patient  reports that he has never smoked. He has never used smokeless tobacco. He reports that he does not drink alcohol or use drugs.   Family History:  The patient's family  history includes Hypertension in his mother; Stroke in his maternal grandfather.    ROS:  Please see the history of present illness.   Otherwise, review of systems are positive for occasional back pain.   All other systems are reviewed and negative.    PHYSICAL EXAM: VS:  BP (!) 152/70 (BP Location: Left Arm, Patient Position: Sitting, Cuff Size: Normal)   Pulse 71   Ht 5\' 7"  (1.702 m)   Wt 168 lb 9.6 oz (76.5 kg)   SpO2 98%   BMI 26.41 kg/m  , BMI Body mass index is 26.41 kg/m. GEN: Well nourished, well developed, in no acute distress  HEENT: normal  Neck: no JVD, carotid bruits, or masses Cardiac: RRR; no murmurs, rubs, or gallops,no edema  Respiratory:  clear to auscultation bilaterally, normal work of breathing GI: soft, nontender, nondistended, + BS MS: no deformity or atrophy  Skin: warm and dry, no rash Neuro:  Strength and sensation are intact Psych: euthymic mood, full affect   EKG:   The ekg ordered in 8/16 was normal   Recent Labs: No results found for requested labs within last 8760 hours.   Lipid Panel    Component Value Date/Time   CHOL 123 (L) 07/31/2015 0744   TRIG 66 07/31/2015 0744   HDL 41 07/31/2015 0744   CHOLHDL 3.0 07/31/2015 0744   VLDL 13 07/31/2015 0744   LDLCALC 69 07/31/2015 0744     Other studies Reviewed: Additional studies/ records that were reviewed today with results demonstrating: prior lab results reviewed.  Cr 3 in the past, now 3.6.   ASSESSMENT AND PLAN:  1. CAD: h/o Taxus stent in 2004.  Bare metal stents in 2014.  Nuclear stress test done for shortness of breath and dizzy spells in 2017 was normal. The dizziness may have been related to the pain medicines that he was taking postoperatively. No further dizziness. In general, Cardiac cath would have to be approached cautiously given his significant renal insufficiency.  2. HTN: Well controlled today. Continue current medications. Increase carvedilol to 12.5 mg BID.  He'll let  us know if anything changes with his blood pressure. 3. CKD: Creatinine is increased since his surgery. We'll check labs again. Followed by nephrology at [facility].  Last Cr 3.11. 4. Hyperlipidemia: Followed by PMD. 5. Hyperkalemia: His potassium is been elevated as well.  Will check at his next blood draw. He has to avoid salt substitutes to the hyperkalemia.   Current medicines are reviewed at length with the patient today.  The patient concerns regarding his medicines were  addressed.  The following changes have been made:  No change  Labs/ tests ordered today include:  No orders of the defined types were placed in this encounter.   Recommend 150 minutes/week of aerobic exercise Low fat, low carb, high fiber diet recommended  Disposition:   FU in 6 months   Signed, Larae Grooms, MD  10/08/2016 3:23 PM    [facility] [place], [place], [place]  [address] Phone: [phone]; Fax: [phone]

## 2016-11-15 ENCOUNTER — Other Ambulatory Visit: Payer: Self-pay | Admitting: Interventional Cardiology

## 2016-12-11 ENCOUNTER — Other Ambulatory Visit: Payer: Self-pay | Admitting: Interventional Cardiology

## 2016-12-30 IMAGING — NM NM MISC PROCEDURE
3 series · 18 of 18 positions shown · non-contrast
Comparison: none

[Series 1: stress-sum-em_(id)_sa · 6.4mm · 6.40mm/px · 6 of 64 frames shown]
[frame 6/64]
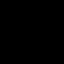
[frame 16/64]
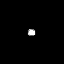
[frame 27/64]
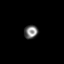
[frame 38/64]
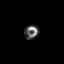
[frame 48/64]
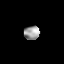
[frame 59/64]
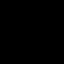

[Series 1: stress-gsp_(id)_sa · 6.4mm · 6.40mm/px · 6 of 512 frames shown]
[frame 43/512]
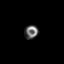
[frame 128/512]
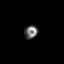
[frame 214/512]
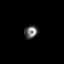
[frame 299/512]
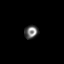
[frame 384/512]
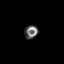
[frame 470/512]
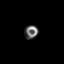

[Series 1: rest_(id)_sa · 6.4mm · 6.40mm/px · 6 of 64 frames shown]
[frame 6/64]
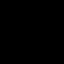
[frame 16/64]
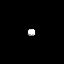
[frame 27/64]
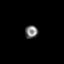
[frame 38/64]
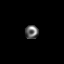
[frame 48/64]
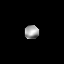
[frame 59/64]
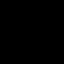

[18 of 18 positions shown; findings below may reference images not displayed]

Canned report from images found in remote index.

Refer to host system for actual result text.

## 2017-03-07 ENCOUNTER — Other Ambulatory Visit: Payer: Self-pay | Admitting: Interventional Cardiology

## 2017-03-10 NOTE — Telephone Encounter (Signed)
Medication Detail    Disp Refills Start    carvedilol (COREG) 12.5 MG tablet 180 tablet 3 10/08/2016    Sig - Route: Take 1 tablet (12.5 mg total) by mouth 2 (two) times daily. - Oral   Sent to pharmacy as: carvedilol (COREG) 12.5 MG tablet   E-Prescribing Status: Receipt confirmed by pharmacy (10/08/2016 3:39 PM EDT)   Pharmacy   [facility], [place]

## 2017-08-19 ENCOUNTER — Other Ambulatory Visit: Payer: Self-pay | Admitting: Interventional Cardiology

## 2017-08-20 ENCOUNTER — Other Ambulatory Visit: Payer: Self-pay

## 2017-08-20 MED ORDER — AMLODIPINE BESYLATE 10 MG PO TABS: 10.0000 mg | ORAL_TABLET | Freq: Every day | ORAL | 0 refills | Status: DC

## 2017-08-22 ENCOUNTER — Other Ambulatory Visit: Payer: Self-pay

## 2017-08-22 MED ORDER — AMLODIPINE BESYLATE 10 MG PO TABS: 10.0000 mg | ORAL_TABLET | Freq: Every day | ORAL | 0 refills | Status: DC

## 2017-09-10 NOTE — Progress Notes (Signed)
Cardiology Office Note   Date:  09/11/2017   ID:  Joshua Thompson, Joshua Thompson 02-Jul-1945, MRN [id number]  PCP:  Merrilee Seashore, MD    No chief complaint on file.  CAD  Wt Readings from Last 3 Encounters:  09/11/17 160 lb 6.4 oz (72.8 kg)  10/08/16 168 lb 9.6 oz (76.5 kg)  08/10/15 166 lb (75.3 kg)       History of Present Illness: Joshua Thompson is a 72 y.o. male    who has had HTN for about 15 years. In 2004, he had chest pain and then a stress test which was abnormal and then a DES (Taxus) was placed, while in [place]. He did well after that time.   In 2010, he had leg pain and kidney pain. He was gaining weight and feeling ill and he was retaining fluid. His BP was high. His creatinine had reached 1.5+. He also had prostatic obstruction leading to the urinary issues.   In 09/2012, he had HTN, urine retention and then he had a catheter placed for his bladder. TURP was recommended, but it was also noted that his BP was up. Another coronary lesion was found. He was in renal failure, Cr 4.5. He then had two bare stents placed. Two weeks later, he had a TURP which was unsuccessful. He had a bladder obstruction. He had a repeat TURP.   In August 2014, he and his wife Moved to [place]. He followed up with urology at [facility]. It was noted that there was an irregularity of the surface of the inside of the bladder. His Cr. Is 3.0 at baseline.  Nebivolol has worked better than Coreg in the past but was more expensive.  He now takes carvedilol BID and metoprolol for prn increased BP.   Hydralazine was recommended to be taken TID, but he did not take this.  Renal dysfunction has limited meds.   He had some sensation in his chest that resolved with an herbal medicine in 2018.  He had some high BP readings.  He continues to be compliant with his medications.  Denies : Chest pain. Dizziness. Leg edema. Nitroglycerin use. Orthopnea. Palpitations. Paroxysmal  nocturnal dyspnea. Shortness of breath. Syncope.   He has some left leg weakness that limits his walking.  He is active at work.   GFR was 22 per his report.   Past Medical History:  Diagnosis Date  . Coronary artery disease   . Hypertension     Past Surgical History:  Procedure Laterality Date  . CORONARY ANGIOPLASTY     3 STENTS  . TRANSURETHRAL RESECTION OF PROSTATE     HAD DONE THREE TIMES     Current Outpatient Medications  Medication Sig Dispense Refill  . amLODipine (NORVASC) 10 MG tablet Take 1 tablet (10 mg total) by mouth daily. Please call and schedule an appt for further refills. 1st attempt 15 tablet 0  . atorvastatin (LIPITOR) 20 MG tablet Take 1 tablet (20 mg total) by mouth daily. Please call and schedule an appt for further refills. 1st attempt 30 tablet 0  . brimonidine (ALPHAGAN) 0.2 % ophthalmic solution Place 1 drop into both eyes daily.  1  . carvedilol (COREG) 12.5 MG tablet Take 1 tablet (12.5 mg total) by mouth 2 (two) times daily. Please call and schedule an appt for further refills. 1st attempt 180 tablet 0  . fluticasone (FLONASE) 50 MCG/ACT nasal spray Place into the nose.    . hydrALAZINE (APRESOLINE) 50 MG tablet  take 2 tablets by mouth three times a day 90 tablet 1  . ketorolac (ACULAR) 0.5 % ophthalmic solution Place 1 drop into both eyes daily.  1  . metoprolol tartrate (LOPRESSOR) 25 MG tablet Take 25 mg by mouth 2 (two) times daily as needed (high blood pressure).    . nitroGLYCERIN (NITROSTAT) 0.4 MG SL tablet Place 1 tablet (0.4 mg total) under the tongue every 5 (five) minutes as needed for chest pain. 25 tablet 5  . ofloxacin (OCUFLOX) 0.3 % ophthalmic solution Place 1 drop into both eyes daily.  1  . pantoprazole (PROTONIX) 40 MG tablet Take 40 mg by mouth daily.      No current facility-administered medications for this visit.     Allergies:   Amoxicillin-pot clavulanate and Ampicillin    Social History:  The patient  reports that  he has never smoked. He has never used smokeless tobacco. He reports that he does not drink alcohol or use drugs.   Family History:  The patient's family history includes Hypertension in his mother; Stroke in his maternal grandfather.    ROS:  Please see the history of present illness.   Otherwise, review of systems are positive for increased BP in the evening.   All other systems are reviewed and negative.    PHYSICAL EXAM: VS:  BP 130/62   Pulse 67   Ht 5\' 7"  (1.702 m)   Wt 160 lb 6.4 oz (72.8 kg)   SpO2 97%   BMI 25.12 kg/m  , BMI Body mass index is 25.12 kg/m. GEN: Well nourished, well developed, in no acute distress  HEENT: normal  Neck: no JVD, carotid bruits, or masses Cardiac: RRR; no murmurs, rubs, or gallops,no edema  Respiratory:  clear to auscultation bilaterally, normal work of breathing GI: soft, nontender, nondistended, + BS MS: no deformity or atrophy  Skin: warm and dry, no rash Neuro:  Strength and sensation are intact Psych: euthymic mood, full affect   EKG:   The ekg ordered today demonstrates normal sinus rhythm, no ST segment changes   Recent Labs: No results found for requested labs within last 8760 hours.   Lipid Panel    Component Value Date/Time   CHOL 123 (L) 07/31/2015 0744   TRIG 66 07/31/2015 0744   HDL 41 07/31/2015 0744   CHOLHDL 3.0 07/31/2015 0744   VLDL 13 07/31/2015 0744   LDLCALC 69 07/31/2015 0744     Other studies Reviewed: Additional studies/ records that were reviewed today with results demonstrating: Prior catheter results reviewed, labs from his primary care doctor done in early March also reviewed.  Per his report, creatinine was 3.7.  LDL 76, HDL 49, triglycerides 52.   ASSESSMENT AND PLAN:  1. CAD: h/o Taxus stent in 2004. Bare metal stents in 2014.  Normal nuclear stress test in 2017.  No angina on medical therapy.  Continuing to try to avoid contrast exposure due to his renal insufficiency. 2. HTN: Since blood  pressure is elevated in the evenings, I have asked him to split his Norvasc dose into 5 mg twice a day rather than taking 10 mg 1 time a day. 3. CKD: Follows with renal MD at [facility].  Stable per his report.  Stage IV based on the GFR he provided. 4. Hyperlipidemia: Lipids well controlled.  Continue atorvastatin. 5. Hyperkalemia: Avoid salt substitutes.   Current medicines are reviewed at length with the patient today.  The patient concerns regarding his medicines were addressed.  The following changes have been made: Change amlodipine to 5 mg twice daily  Labs/ tests ordered today include: To be checked with primary care doctor No orders of the defined types were placed in this encounter.   Recommend 150 minutes/week of aerobic exercise Low fat, low carb, high fiber diet recommended  Disposition:   FU in 1 year   Signed, Larae Grooms, MD  09/11/2017 10:57 AM    [facility] [place], [place], [place]  [address] Phone: [phone]; Fax: [phone]

## 2017-09-11 ENCOUNTER — Ambulatory Visit: Payer: BLUE CROSS/BLUE SHIELD | Admitting: Interventional Cardiology

## 2017-09-11 ENCOUNTER — Encounter: Payer: Self-pay | Admitting: Interventional Cardiology

## 2017-09-11 VITALS — BP 130/62 | HR 67 | Ht 67.0 in | Wt 160.4 lb

## 2017-09-11 DIAGNOSIS — I25119 Atherosclerotic heart disease of native coronary artery with unspecified angina pectoris: Secondary | ICD-10-CM

## 2017-09-11 DIAGNOSIS — I10 Essential (primary) hypertension: Secondary | ICD-10-CM

## 2017-09-11 DIAGNOSIS — E782 Mixed hyperlipidemia: Secondary | ICD-10-CM | POA: Diagnosis not present

## 2017-09-11 DIAGNOSIS — N184 Chronic kidney disease, stage 4 (severe): Secondary | ICD-10-CM | POA: Diagnosis not present

## 2017-09-11 MED ORDER — AMLODIPINE BESYLATE 5 MG PO TABS: 5.0000 mg | ORAL_TABLET | Freq: Two times a day (BID) | ORAL | 3 refills | Status: DC

## 2017-09-11 NOTE — Patient Instructions (Signed)
Medication Instructions:  Your physician has recommended you make the following change in your medication:   TAKE: amlodipine (norvasc) 5 mg twice a day    Labwork: None ordered  Testing/Procedures: None ordered  Follow-Up: Your physician wants you to follow-up in: 1 year with Dr. Irish Lack. You will receive a reminder letter in the mail two months in advance. If you don't receive a letter, please call our office to schedule the follow-up appointment.   Any Other Special Instructions Will Be Listed Below (If Applicable).     If you need a refill on your cardiac medications before your next appointment, please call your pharmacy.

## 2017-10-11 ENCOUNTER — Other Ambulatory Visit: Payer: Self-pay | Admitting: Interventional Cardiology

## 2017-11-05 NOTE — Telephone Encounter (Signed)
Approval good from 06/23/15 until 06/16/38. [employer] approval letter has been sent to be scanned into the pts chart.

## 2018-04-06 ENCOUNTER — Other Ambulatory Visit: Payer: Self-pay | Admitting: Interventional Cardiology

## 2018-05-04 ENCOUNTER — Other Ambulatory Visit: Payer: Self-pay | Admitting: Interventional Cardiology

## 2018-05-04 MED ORDER — HYDRALAZINE HCL 50 MG PO TABS: 100.0000 mg | ORAL_TABLET | Freq: Three times a day (TID) | ORAL | 0 refills | Status: DC

## 2018-05-04 NOTE — Telephone Encounter (Signed)
Pt's pharmacy is requesting a refill on fluticasone nasal spray. Would Dr. Irish Lack like to refill this medication? Please address

## 2018-05-05 NOTE — Telephone Encounter (Signed)
Defer to PCP

## 2018-05-07 ENCOUNTER — Encounter: Payer: Self-pay | Admitting: Cardiovascular Disease

## 2018-05-25 NOTE — Progress Notes (Deleted)
Cardiology Office Note   Date:  05/25/2018   ID:  Wolf, Boulay [birth year], MRN [id number]  PCP:  Merrilee Seashore, MD  Cardiologist: Dr. Irish Lack, MD     No chief complaint on file.   History of Present Illness: Joshua Thompson is a 72 y.o. male who presents for routine one-year follow-up for CAD and HTN, seen for Dr. Irish Lack.   Mr. Sigg has a prior history of hypertension, CAD s/p DES/PCI in 2004 while in [place] and CKD stage IV s/p multiple TURP (2014) procedures who is followed by nephrology at [facility].  Patient was last seen by his primary cardiologist on 09/11/2017. Per chart review, he was doing relatively well with no complaints of chest pain, dizziness, lower extremity swelling, orthopnea or palpitations. He had reported some high blood pressure readings at home however he continued medication compliance. He takes carvedilol BID and hydralazine. His renal dysfunction has limited his antihypertensives.    Today he states that     Past Medical History:  Diagnosis Date  . Coronary artery disease   . Hypertension     Past Surgical History:  Procedure Laterality Date  . CORONARY ANGIOPLASTY     3 STENTS  . TRANSURETHRAL RESECTION OF PROSTATE     HAD DONE THREE TIMES     Current Outpatient Medications  Medication Sig Dispense Refill  . amLODipine (NORVASC) 5 MG tablet Take 1 tablet (5 mg total) by mouth 2 (two) times daily. 180 tablet 3  . atorvastatin (LIPITOR) 20 MG tablet TAKE 1 TABLET BY MOUTH EVERY DAY 30 tablet 4  . brimonidine (ALPHAGAN) 0.2 % ophthalmic solution Place 1 drop into both eyes daily.  1  . carvedilol (COREG) 12.5 MG tablet Take 1 tablet (12.5 mg total) by mouth 2 (two) times daily. 180 tablet 3  . fluticasone (FLONASE) 50 MCG/ACT nasal spray Place into the nose.    . hydrALAZINE (APRESOLINE) 50 MG tablet Take 2 tablets (100 mg total) by mouth 3 (three) times daily. Please make appt with Dr. Irish Lack for March for future  refills. 1st attempt 90 tablet 0  . ketorolac (ACULAR) 0.5 % ophthalmic solution Place 1 drop into both eyes daily.  1  . metoprolol tartrate (LOPRESSOR) 25 MG tablet Take 25 mg by mouth 2 (two) times daily as needed (high blood pressure).    . nitroGLYCERIN (NITROSTAT) 0.4 MG SL tablet Place 1 tablet (0.4 mg total) under the tongue every 5 (five) minutes as needed for chest pain. 25 tablet 5  . ofloxacin (OCUFLOX) 0.3 % ophthalmic solution Place 1 drop into both eyes daily.  1  . pantoprazole (PROTONIX) 40 MG tablet Take 40 mg by mouth daily.      No current facility-administered medications for this visit.     Allergies:   Amoxicillin-pot clavulanate and Ampicillin   Social History:  The patient  reports that he has never smoked. He has never used smokeless tobacco. He reports that he does not drink alcohol or use drugs.   Family History:  The patient's family history includes Hypertension in his mother; Stroke in his maternal grandfather.    ROS:  Please see the history of present illness. Otherwise, review of systems are positive for none. All other systems are reviewed and negative.    PHYSICAL EXAM: VS:  There were no vitals taken for this visit. , BMI There is no height or weight on file to calculate BMI.  General: Well developed, well nourished, NAD Skin: Warm,  dry, intact  Head: Normocephalic, atraumatic, sclera non-icteric, no xanthomas, clear, moist mucus membranes. Neck: Negative for carotid bruits. No JVD Lungs:Clear to ausculation bilaterally. No wheezes, rales, or rhonchi. Breathing is unlabored. Cardiovascular: RRR with S1 S2. No murmurs, rubs, gallops, or LV heave appreciated. Abdomen: Soft, non-tender, non-distended with normoactive bowel sounds. No hepatomegaly, No rebound/guarding. No obvious abdominal masses. MSK: Strength and tone appear normal for age. 5/5 in all extremities Extremities: No edema. No clubbing or cyanosis. DP/PT pulses 2+ bilaterally Neuro: Alert  and oriented. No focal deficits. No facial asymmetry. MAE spontaneously. Psych: Responds to questions appropriately with normal affect.     EKG:  EKG {ACTION; IS/IS HFW:[id number]} ordered today. The ekg ordered today demonstrates ***   Recent Labs: No results found for requested labs within last 8760 hours.    Lipid Panel    Component Value Date/Time   CHOL 123 (L) 07/31/2015 0744   TRIG 66 07/31/2015 0744   HDL 41 07/31/2015 0744   CHOLHDL 3.0 07/31/2015 0744   VLDL 13 07/31/2015 0744   LDLCALC 69 07/31/2015 0744     Wt Readings from Last 3 Encounters:  09/11/17 160 lb 6.4 oz (72.8 kg)  10/08/16 168 lb 9.6 oz (76.5 kg)  08/10/15 166 lb (75.3 kg)     Other studies Reviewed: Additional studies/ records that were reviewed today include:   Nuclear stress test 08/10/2015: Nuclear stress EF: 67%. The left ventricular ejection fraction is hyperdynamic (>65%). Blood pressure demonstrated a hypertensive response to exercise. There was no ST segment deviation noted during stress. The study is normal.   Normal stress nuclear study with no ischemia or infarction; EF 67 with normal wall motion.   ASSESSMENT AND PLAN:  1.  CAD s/p Taxus stent-2004 and bare-metal stents in 2014: -Last nuclear stress test performed 2017 which was normal -No complaints of angina, plan continue medical treatment  -Continue to try to avoid contrast exposure secondary to renal insufficiency -Continue atorvastatin, carvedilol   2.  HTN: -At last appointment, patient reported elevated blood pressure readings in the evenings at home. Given this, his amlodipine was split to 2 doses of 5 mg in the mornings and 5 mg in the evenings -BP today  -Continue amlodipine splinted 2 doses, carvedilol 12.5 mg, hydralazine 5mg , metoprolol tartrate 25 mg   3.  CKD stage IV: -Follows with nephrology at [facility] creatinine, 2.88 on 07/31/2015 with a baseline of 2.9- 3.3 -Will repeat BMP at today  4.   Hyperlipidemia: -LDL on 07/31/2015 was 69 -Will repeat lipid panel today if fasting -Continue atorvastatin   5.  Hyperkalemia: -K+ at last office visit 5.3 -Avoid salt substitute  -Will obtain BMET today    Current medicines are reviewed at length with the patient today.  The patient {ACTIONS; HAS/DOES NOT HAVE:19233} concerns regarding medicines.  The following changes have been made:  {PLAN; NO CHANGE:13088:s}  Labs/ tests ordered today include: *** No orders of the defined types were placed in this encounter.   Disposition:   FU with *** in {gen number 5-02:774128} {Days to years:10300}  Signed, Kathyrn Drown, NP  05/25/2018 12:09 PM    [facility] [place], [place], [place]  [address] Phone: [phone]; Fax: [phone]

## 2018-05-26 ENCOUNTER — Ambulatory Visit: Payer: BLUE CROSS/BLUE SHIELD | Admitting: Cardiology

## 2018-05-28 ENCOUNTER — Ambulatory Visit: Payer: BLUE CROSS/BLUE SHIELD | Admitting: Family Medicine

## 2018-05-28 ENCOUNTER — Encounter: Payer: Self-pay | Admitting: Family Medicine

## 2018-05-28 VITALS — BP 138/68 | HR 75 | Temp 98.1°F | Ht 67.0 in | Wt 168.0 lb

## 2018-05-28 DIAGNOSIS — E790 Hyperuricemia without signs of inflammatory arthritis and tophaceous disease: Secondary | ICD-10-CM

## 2018-05-28 DIAGNOSIS — R202 Paresthesia of skin: Secondary | ICD-10-CM | POA: Diagnosis not present

## 2018-05-28 DIAGNOSIS — L989 Disorder of the skin and subcutaneous tissue, unspecified: Secondary | ICD-10-CM

## 2018-05-28 DIAGNOSIS — N184 Chronic kidney disease, stage 4 (severe): Secondary | ICD-10-CM | POA: Diagnosis not present

## 2018-05-28 DIAGNOSIS — Z23 Encounter for immunization: Secondary | ICD-10-CM

## 2018-05-28 DIAGNOSIS — I2510 Atherosclerotic heart disease of native coronary artery without angina pectoris: Secondary | ICD-10-CM | POA: Diagnosis not present

## 2018-05-28 DIAGNOSIS — K219 Gastro-esophageal reflux disease without esophagitis: Secondary | ICD-10-CM

## 2018-05-28 MED ORDER — RABEPRAZOLE SODIUM 20 MG PO TBEC: 20.0000 mg | DELAYED_RELEASE_TABLET | Freq: Every day | ORAL | 1 refills | Status: DC

## 2018-05-28 MED ORDER — CLOBETASOL PROPIONATE 0.05 % EX OINT: 1.0000 "application " | TOPICAL_OINTMENT | Freq: Two times a day (BID) | CUTANEOUS | 0 refills | Status: AC

## 2018-05-28 NOTE — Patient Instructions (Addendum)
The only lifestyle changes that have data behind them are weight loss for the overweight/obese and elevating the head of the bed. Finding out which foods/positions are triggers is important.  Stop chewing gum, drinking carbonated beverages, gulping liquids, and drinking alcohol to help with belching.  These foods may cause you to belch more: Wheat, barley, rye, onion, leek, white part of spring onion, garlic, shallots, artichokes, beetroot, fennel, peas, chicory, pistachio, cashews, legumes, lentils, and chickpeas; Milk, custard, ice cream, and yogurt; Apples, pears, mangoes, cherries, watermelon, asparagus, sugar snap peas, honey, high-fructose corn syrup; Apricots, nectarines, peaches, plums, mushrooms, cauliflower, artificially sweetened chewing gum and confectionery  If this cream is not helpful, come back and we will consider a biopsy.  EXERCISES  RANGE OF MOTION (ROM) AND STRETCHING EXERCISES - Low Back Pain Most people with lower back pain will find that their symptoms get worse with excessive bending forward (flexion) or arching at the lower back (extension). The exercises that will help resolve your symptoms will focus on the opposite motion.  If you have pain, numbness or tingling which travels down into your buttocks, leg or foot, the goal of the therapy is for these symptoms to move closer to your back and eventually resolve. Sometimes, these leg symptoms will get better, but your lower back pain may worsen. This is often an indication of progress in your rehabilitation. Be very alert to any changes in your symptoms and the activities in which you participated in the 24 hours prior to the change. Sharing this information with your caregiver will allow him or her to most efficiently treat your condition. These exercises may help you when beginning to rehabilitate your injury. Your symptoms may resolve with or without further involvement from your physician, physical therapist or athletic  trainer. While completing these exercises, remember:   Restoring tissue flexibility helps normal motion to return to the joints. This allows healthier, less painful movement and activity.  An effective stretch should be held for at least 30 seconds.  A stretch should never be painful. You should only feel a gentle lengthening or release in the stretched tissue. FLEXION RANGE OF MOTION AND STRETCHING EXERCISES:  STRETCH - Flexion, Single Knee to Chest   Lie on a firm bed or floor with both legs extended in front of you.  Keeping one leg in contact with the floor, bring your opposite knee to your chest. Hold your leg in place by either grabbing behind your thigh or at your knee.  Pull until you feel a gentle stretch in your low back. Hold 30 seconds.  Slowly release your grasp and repeat the exercise with the opposite side. Repeat 2 times. Complete this exercise 3 times per week.   STRETCH - Flexion, Double Knee to Chest  Lie on a firm bed or floor with both legs extended in front of you.  Keeping one leg in contact with the floor, bring your opposite knee to your chest.  Tense your stomach muscles to support your back and then lift your other knee to your chest. Hold your legs in place by either grabbing behind your thighs or at your knees.  Pull both knees toward your chest until you feel a gentle stretch in your low back. Hold 30 seconds.  Tense your stomach muscles and slowly return one leg at a time to the floor. Repeat 2 times. Complete this exercise 3 times per week.   STRETCH - Low Trunk Rotation  Lie on a firm bed or floor.  Keeping your legs in front of you, bend your knees so they are both pointed toward the ceiling and your feet are flat on the floor.  Extend your arms out to the side. This will stabilize your upper body by keeping your shoulders in contact with the floor.  Gently and slowly drop both knees together to one side until you feel a gentle stretch in your  low back. Hold for 30 seconds.  Tense your stomach muscles to support your lower back as you bring your knees back to the starting position. Repeat the exercise to the other side. Repeat 2 times. Complete this exercise at least 3 times per week.   EXTENSION RANGE OF MOTION AND FLEXIBILITY EXERCISES:  STRETCH - Extension, Prone on Elbows   Lie on your stomach on the floor, a bed will be too soft. Place your palms about shoulder width apart and at the height of your head.  Place your elbows under your shoulders. If this is too painful, stack pillows under your chest.  Allow your body to relax so that your hips drop lower and make contact more completely with the floor.  Hold this position for 30 seconds.  Slowly return to lying flat on the floor. Repeat 2 times. Complete this exercise 3 times per week.   RANGE OF MOTION - Extension, Prone Press Ups  Lie on your stomach on the floor, a bed will be too soft. Place your palms about shoulder width apart and at the height of your head.  Keeping your back as relaxed as possible, slowly straighten your elbows while keeping your hips on the floor. You may adjust the placement of your hands to maximize your comfort. As you gain motion, your hands will come more underneath your shoulders.  Hold this position 30 seconds.  Slowly return to lying flat on the floor. Repeat 2 times. Complete this exercise 3 times per week.   RANGE OF MOTION- Quadruped, Neutral Spine   Assume a hands and knees position on a firm surface. Keep your hands under your shoulders and your knees under your hips. You may place padding under your knees for comfort.  Drop your head and point your tailbone toward the ground below you. This will round out your lower back like an angry cat. Hold this position for 30 seconds.  Slowly lift your head and release your tail bone so that your back sags into a large arch, like an old horse.  Hold this position for 30  seconds.  Repeat this until you feel limber in your low back.  Now, find your "sweet spot." This will be the most comfortable position somewhere between the two previous positions. This is your neutral spine. Once you have found this position, tense your stomach muscles to support your low back.  Hold this position for 30 seconds. Repeat 2 times. Complete this exercise 3 times per week.   STRENGTHENING EXERCISES - Low Back Sprain These exercises may help you when beginning to rehabilitate your injury. These exercises should be done near your "sweet spot." This is the neutral, low-back arch, somewhere between fully rounded and fully arched, that is your least painful position. When performed in this safe range of motion, these exercises can be used for people who have either a flexion or extension based injury. These exercises may resolve your symptoms with or without further involvement from your physician, physical therapist or athletic trainer. While completing these exercises, remember:   Muscles can gain both the endurance  and the strength needed for everyday activities through controlled exercises.  Complete these exercises as instructed by your physician, physical therapist or athletic trainer. Increase the resistance and repetitions only as guided.  You may experience muscle soreness or fatigue, but the pain or discomfort you are trying to eliminate should never worsen during these exercises. If this pain does worsen, stop and make certain you are following the directions exactly. If the pain is still present after adjustments, discontinue the exercise until you can discuss the trouble with your caregiver.  STRENGTHENING - Deep Abdominals, Pelvic Tilt   Lie on a firm bed or floor. Keeping your legs in front of you, bend your knees so they are both pointed toward the ceiling and your feet are flat on the floor.  Tense your lower abdominal muscles to press your low back into the floor.  This motion will rotate your pelvis so that your tail bone is scooping upwards rather than pointing at your feet or into the floor. With a gentle tension and even breathing, hold this position for 3 seconds. Repeat 2 times. Complete this exercise 3 times per week.   STRENGTHENING - Abdominals, Crunches   Lie on a firm bed or floor. Keeping your legs in front of you, bend your knees so they are both pointed toward the ceiling and your feet are flat on the floor. Cross your arms over your chest.  Slightly tip your chin down without bending your neck.  Tense your abdominals and slowly lift your trunk high enough to just clear your shoulder blades. Lifting higher can put excessive stress on the lower back and does not further strengthen your abdominal muscles.  Control your return to the starting position. Repeat 2 times. Complete this exercise 3 times per week.   STRENGTHENING - Quadruped, Opposite UE/LE Lift   Assume a hands and knees position on a firm surface. Keep your hands under your shoulders and your knees under your hips. You may place padding under your knees for comfort.  Find your neutral spine and gently tense your abdominal muscles so that you can maintain this position. Your shoulders and hips should form a rectangle that is parallel with the floor and is not twisted.  Keeping your trunk steady, lift your right hand no higher than your shoulder and then your left leg no higher than your hip. Make sure you are not holding your breath. Hold this position for 30 seconds.  Continuing to keep your abdominal muscles tense and your back steady, slowly return to your starting position. Repeat with the opposite arm and leg. Repeat 2 times. Complete this exercise 3 times per week.   STRENGTHENING - Abdominals and Quadriceps, Straight Leg Raise   Lie on a firm bed or floor with both legs extended in front of you.  Keeping one leg in contact with the floor, bend the other knee so that  your foot can rest flat on the floor.  Find your neutral spine, and tense your abdominal muscles to maintain your spinal position throughout the exercise.  Slowly lift your straight leg off the floor about 6 inches for a count of 3, making sure to not hold your breath.  Still keeping your neutral spine, slowly lower your leg all the way to the floor. Repeat this exercise with each leg 2 times. Complete this exercise 3 times per week.  POSTURE AND BODY MECHANICS CONSIDERATIONS - Low Back Sprain Keeping correct posture when sitting, standing or completing your activities will  reduce the stress put on different body tissues, allowing injured tissues a chance to heal and limiting painful experiences. The following are general guidelines for improved posture.  While reading these guidelines, remember:  The exercises prescribed by your provider will help you have the flexibility and strength to maintain correct postures.  The correct posture provides the best environment for your joints to work. All of your joints have less wear and tear when properly supported by a spine with good posture. This means you will experience a healthier, less painful body.  Correct posture must be practiced with all of your activities, especially prolonged sitting and standing. Correct posture is as important when doing repetitive low-stress activities (typing) as it is when doing a single heavy-load activity (lifting).  RESTING POSITIONS Consider which positions are most painful for you when choosing a resting position. If you have pain with flexion-based activities (sitting, bending, stooping, squatting), choose a position that allows you to rest in a less flexed posture. You would want to avoid curling into a fetal position on your side. If your pain worsens with extension-based activities (prolonged standing, working overhead), avoid resting in an extended position such as sleeping on your stomach. Most people will find  more comfort when they rest with their spine in a more neutral position, neither too rounded nor too arched. Lying on a non-sagging bed on your side with a pillow between your knees, or on your back with a pillow under your knees will often provide some relief. Keep in mind, being in any one position for a prolonged period of time, no matter how correct your posture, can still lead to stiffness.  PROPER SITTING POSTURE In order to minimize stress and discomfort on your spine, you must sit with correct posture. Sitting with good posture should be effortless for a healthy body. Returning to good posture is a gradual process. Many people can work toward this most comfortably by using various supports until they have the flexibility and strength to maintain this posture on their own. When sitting with proper posture, your ears will fall over your shoulders and your shoulders will fall over your hips. You should use the back of the chair to support your upper back. Your lower back will be in a neutral position, just slightly arched. You may place a small pillow or folded towel at the base of your lower back for  support.  When working at a desk, create an environment that supports good, upright posture. Without extra support, muscles tire, which leads to excessive strain on joints and other tissues. Keep these recommendations in mind:  CHAIR:  A chair should be able to slide under your desk when your back makes contact with the back of the chair. This allows you to work closely.  The chair's height should allow your eyes to be level with the upper part of your monitor and your hands to be slightly lower than your elbows.  BODY POSITION  Your feet should make contact with the floor. If this is not possible, use a foot rest.  Keep your ears over your shoulders. This will reduce stress on your neck and low back.  INCORRECT SITTING POSTURES  If you are feeling tired and unable to assume a healthy sitting  posture, do not slouch or slump. This puts excessive strain on your back tissues, causing more damage and pain. Healthier options include:  Using more support, like a lumbar pillow.  Switching tasks to something that requires you to  be upright or walking.  Talking a brief walk.  Lying down to rest in a neutral-spine position.  PROLONGED STANDING WHILE SLIGHTLY LEANING FORWARD  When completing a task that requires you to lean forward while standing in one place for a long time, place either foot up on a stationary 2-4 inch high object to help maintain the best posture. When both feet are on the ground, the lower back tends to lose its slight inward curve. If this curve flattens (or becomes too large), then the back and your other joints will experience too much stress, tire more quickly, and can cause pain.  CORRECT STANDING POSTURES Proper standing posture should be assumed with all daily activities, even if they only take a few moments, like when brushing your teeth. As in sitting, your ears should fall over your shoulders and your shoulders should fall over your hips. You should keep a slight tension in your abdominal muscles to brace your spine. Your tailbone should point down to the ground, not behind your body, resulting in an over-extended swayback posture.   INCORRECT STANDING POSTURES  Common incorrect standing postures include a forward head, locked knees and/or an excessive swayback. WALKING Walk with an upright posture. Your ears, shoulders and hips should all line-up.  PROLONGED ACTIVITY IN A FLEXED POSITION When completing a task that requires you to bend forward at your waist or lean over a low surface, try to find a way to stabilize 3 out of 4 of your limbs. You can place a hand or elbow on your thigh or rest a knee on the surface you are reaching across. This will provide you more stability, so that your muscles do not tire as quickly. By keeping your knees relaxed, or slightly  bent, you will also reduce stress across your lower back. CORRECT LIFTING TECHNIQUES  DO :  Assume a wide stance. This will provide you more stability and the opportunity to get as close as possible to the object which you are lifting.  Tense your abdominals to brace your spine. Bend at the knees and hips. Keeping your back locked in a neutral-spine position, lift using your leg muscles. Lift with your legs, keeping your back straight.  Test the weight of unknown objects before attempting to lift them.  Try to keep your elbows locked down at your sides in order get the best strength from your shoulders when carrying an object.     Always ask for help when lifting heavy or awkward objects. INCORRECT LIFTING TECHNIQUES DO NOT:   Lock your knees when lifting, even if it is a small object.  Bend and twist. Pivot at your feet or move your feet when needing to change directions.  Assume that you can safely pick up even a paperclip without proper posture.

## 2018-05-28 NOTE — Progress Notes (Signed)
Chief Complaint  Patient presents with  . New Patient (Initial Visit)       New Patient Visit SUBJECTIVE: HPI: Joshua Thompson is an 72 y.o.male who is being seen for establishing care.  The patient was previously seen at [facility]. Here w wife.  Having issues with reflux.   Has HTN, HLD and CAD managed by cardiology team.  No chest pain or shortness of breath.  He has a history of reflux.  Lately has been having belching and pain in the epigastric region.  He used to be on AcipHex and usually his symptoms are well controlled.  He needs a refill.  He has a history of numbness/tingling in his feet in the morning.  He also has a history of back pain.  No injury or weakness noted.  No loss of control of his bowel or bladder function.  He has a dark area on his lower extremity.  He has been using over-the-counter hydrocortisone that is slightly helpful.  No itching or growth.  Nothing is draining from it.  Denies any fevers.  Allergies  Allergen Reactions  . Amoxicillin-Pot Clavulanate Rash  . Ampicillin Rash    Past Medical History:  Diagnosis Date  . Coronary artery disease   . GERD (gastroesophageal reflux disease)   . Hyperlipidemia   . Hypertension    Past Surgical History:  Procedure Laterality Date  . CORONARY ANGIOPLASTY     3 STENTS  . TRANSURETHRAL RESECTION OF PROSTATE     HAD DONE THREE TIMES   Family History  Problem Relation Age of Onset  . Hypertension Mother   . Stroke Maternal Grandfather   . Arrhythmia Neg Hx   . Heart attack Neg Hx    Allergies  Allergen Reactions  . Amoxicillin-Pot Clavulanate Rash  . Ampicillin Rash    Current Outpatient Medications:  .  amLODipine (NORVASC) 5 MG tablet, Take 1 tablet (5 mg total) by mouth 2 (two) times daily., Disp: 180 tablet, Rfl: 3 .  atorvastatin (LIPITOR) 20 MG tablet, TAKE 1 TABLET BY MOUTH EVERY DAY, Disp: 30 tablet, Rfl: 4 .  carvedilol (COREG) 12.5 MG tablet, Take 1 tablet (12.5 mg total) by mouth 2  (two) times daily., Disp: 180 tablet, Rfl: 3 .  fluticasone (FLONASE) 50 MCG/ACT nasal spray, Place into the nose., Disp: , Rfl:  .  hydrALAZINE (APRESOLINE) 50 MG tablet, Take 2 tablets (100 mg total) by mouth 3 (three) times daily. Please make appt with Dr. Irish Lack for March for future refills. 1st attempt, Disp: 90 tablet, Rfl: 0 .  metoprolol tartrate (LOPRESSOR) 25 MG tablet, Take 25 mg by mouth 2 (two) times daily as needed (high blood pressure)., Disp: , Rfl:  .  nitroGLYCERIN (NITROSTAT) 0.4 MG SL tablet, Place 1 tablet (0.4 mg total) under the tongue every 5 (five) minutes as needed for chest pain., Disp: 25 tablet, Rfl: 5 .  pantoprazole (PROTONIX) 40 MG tablet, Take 40 mg by mouth daily. , Disp: , Rfl:  .  clobetasol ointment (TEMOVATE) 4.13 %, Apply 1 application topically 2 (two) times daily for 14 days., Disp: 30 g, Rfl: 0 .  RABEprazole (ACIPHEX) 20 MG tablet, Take 1 tablet (20 mg total) by mouth daily., Disp: 90 tablet, Rfl: 1  ROS Cardiovascular: No chest pain Lungs: no shortness of breath GI: No bowel incontinence GU: No bladder incontinence Neuro: +paresthesias MSK: +back pain Skin: +dark patch on leg Const: no fevers Eyes: No blindness HEENT: No ST Psych: No complaints of depression  OBJECTIVE: BP 138/68 (BP Location: Left Arm, Patient Position: Sitting, Cuff Size: Normal)   Pulse 75   Temp 98.1 F (36.7 C) (Oral)   Ht 5\' 7"  (1.702 m)   Wt 168 lb (76.2 kg)   SpO2 98%   BMI 26.31 kg/m   Constitutional: -  VS reviewed -  Well developed, well nourished, appears stated age -  No apparent distress  Psychiatric: -  Oriented to person, place, and time -  Memory intact -  Affect and mood normal -  Fluent conversation, good eye contact -  Judgment and insight age appropriate  Eye: -  Conjunctivae clear, no discharge -  Pupils symmetric, round, reactive to light  ENMT: -  MMM    Pharynx moist, no exudate, no erythema  Neck: -  No gross swelling, no palpable  masses -  Thyroid midline, not enlarged, mobile, no palpable masses  Cardiovascular: -  RRR -  +pitting b.l LE edema  Respiratory: -  Normal respiratory effort, no accessory muscle use, no retraction -  Breath sounds equal, no wheezes, no ronchi, no crackles  Gastrointestinal: -  Bowel sounds normal -  +epigastric tenderness, no distention, no guarding, no masses  Neurological:  -  CN II - XII grossly intact -  Sensation grossly intact to light touch, equal bilaterally  Musculoskeletal: -  No clubbing, no cyanosis -  Gait normal  Skin: -  Hyperpigmented patch on RLE medial side, no fluctuance, ttp, erythema, scaling, drainage -  Warm and dry to palpation   ASSESSMENT/PLAN: Gastroesophageal reflux disease, esophagitis presence not specified - Plan: RABEprazole (ACIPHEX) 20 MG tablet  Skin lesion - Plan: clobetasol ointment (TEMOVATE) 0.05 %  Elevated uric acid in blood - Plan: Uric acid  CKD (chronic kidney disease), stage IV (HCC) - Plan: Comprehensive metabolic panel  Coronary artery disease involving native heart without angina pectoris, unspecified vessel or lesion type - Plan: Lipid panel  Paresthesias - Plan: TSH, Hemoglobin A1c, B12, Vitamin D (25 hydroxy)  Patient instructed to sign release of records form from his previous PCP. Update imms.  Ck labs. Stretches/exercises given. Cream for skin lesion, will biopsy if no improvement or consider lesion steroid injections. Looks like GA. Patient should return 1 mo to reck back pain/paresthesias. The patient voiced understanding and agreement to the plan.   [place], DO 05/28/18  3:29 PM

## 2018-05-28 NOTE — Progress Notes (Signed)
Pre visit review using our clinic review tool, if applicable. No additional management support is needed unless otherwise documented below in the visit note. 

## 2018-05-28 NOTE — Addendum Note (Signed)
Addended by: Sharon Seller B on: 05/28/2018 03:47 PM   Modules accepted: Orders

## 2018-05-29 ENCOUNTER — Other Ambulatory Visit: Payer: BLUE CROSS/BLUE SHIELD

## 2018-05-29 ENCOUNTER — Other Ambulatory Visit: Payer: Self-pay | Admitting: Interventional Cardiology

## 2018-05-29 DIAGNOSIS — D510 Vitamin B12 deficiency anemia due to intrinsic factor deficiency: Secondary | ICD-10-CM

## 2018-05-29 LAB — COMPREHENSIVE METABOLIC PANEL
ALT: 11 U/L (ref 0–53)
AST: 15 U/L (ref 0–37)
Albumin: 4.5 g/dL (ref 3.5–5.2)
Alkaline Phosphatase: 72 U/L (ref 39–117)
BUN: 43 mg/dL — ABNORMAL HIGH (ref 6–23)
CO2: 21 meq/L (ref 19–32)
Calcium: 9.6 mg/dL (ref 8.4–10.5)
Chloride: 106 mEq/L (ref 96–112)
Creatinine, Ser: 2.97 mg/dL — ABNORMAL HIGH (ref 0.40–1.50)
GFR: 22.3 mL/min — ABNORMAL LOW (ref >60.00)
GLUCOSE: 106 mg/dL — AB (ref 70–99)
Potassium: 4.4 mEq/L (ref 3.5–5.1)
SODIUM: 140 meq/L (ref 135–145)
Total Bilirubin: 0.5 mg/dL (ref 0.2–1.2)
Total Protein: 7.3 g/dL (ref 6.0–8.3)

## 2018-05-29 LAB — URIC ACID: Uric Acid, Serum: 7.5 mg/dL (ref 4.0–7.8)

## 2018-05-29 LAB — LIPID PANEL
Cholesterol: 135 mg/dL (ref 0–200)
HDL: 51.1 mg/dL (ref >39.00)
LDL Cholesterol: 63 mg/dL (ref 0–99)
NONHDL: 83.95
Total CHOL/HDL Ratio: 3
Triglycerides: 106 mg/dL (ref 0.0–149.0)
VLDL: 21.2 mg/dL (ref 0.0–40.0)

## 2018-05-29 LAB — HEMOGLOBIN A1C: Hgb A1c MFr Bld: 5.8 % (ref 4.6–6.5)

## 2018-05-29 LAB — TSH: TSH: 1.13 u[IU]/mL (ref 0.35–4.50)

## 2018-05-29 LAB — VITAMIN B12: Vitamin B-12: 144 pg/mL — ABNORMAL LOW (ref 211–911)

## 2018-05-29 LAB — VITAMIN D 25 HYDROXY (VIT D DEFICIENCY, FRACTURES): VITD: 28.39 ng/mL — ABNORMAL LOW (ref 30.00–100.00)

## 2018-06-01 ENCOUNTER — Other Ambulatory Visit: Payer: Self-pay | Admitting: Interventional Cardiology

## 2018-06-01 LAB — INTRINSIC FACTOR ANTIBODIES: Intrinsic Factor: NEGATIVE

## 2018-06-02 NOTE — Telephone Encounter (Signed)
Pt's pharmacy is requesting a refill on fluticasone (flonase), would Dr. Irish Lack like to refill this medication? Please address

## 2018-06-02 NOTE — Telephone Encounter (Signed)
Defer to PCP

## 2018-06-05 ENCOUNTER — Encounter: Payer: Self-pay | Admitting: Family Medicine

## 2018-06-05 DIAGNOSIS — K219 Gastro-esophageal reflux disease without esophagitis: Secondary | ICD-10-CM

## 2018-06-08 MED ORDER — PANTOPRAZOLE SODIUM 40 MG PO TBEC: 40.0000 mg | DELAYED_RELEASE_TABLET | Freq: Every day | ORAL | 3 refills | Status: DC

## 2018-06-08 MED ORDER — ATORVASTATIN CALCIUM 20 MG PO TABS: 20.0000 mg | ORAL_TABLET | Freq: Every day | ORAL | 3 refills | Status: DC

## 2018-06-08 MED ORDER — METOPROLOL TARTRATE 25 MG PO TABS: 25.0000 mg | ORAL_TABLET | Freq: Two times a day (BID) | ORAL | 3 refills | Status: DC | PRN

## 2018-06-08 MED ORDER — RABEPRAZOLE SODIUM 20 MG PO TBEC: 20.0000 mg | DELAYED_RELEASE_TABLET | Freq: Every day | ORAL | 1 refills | Status: DC

## 2018-06-08 MED ORDER — CARVEDILOL 12.5 MG PO TABS: 12.5000 mg | ORAL_TABLET | Freq: Two times a day (BID) | ORAL | 0 refills | Status: DC

## 2018-06-12 MED ORDER — HYDRALAZINE HCL 50 MG PO TABS: 100.0000 mg | ORAL_TABLET | Freq: Three times a day (TID) | ORAL | 5 refills | Status: DC

## 2018-06-12 NOTE — Addendum Note (Signed)
Addended by: Sharon Seller B on: 06/12/2018 07:02 AM   Modules accepted: Orders

## 2018-06-12 NOTE — Addendum Note (Signed)
Addended by: Ames Coupe on: 06/12/2018 07:04 AM   Modules accepted: Orders

## 2018-06-22 ENCOUNTER — Other Ambulatory Visit: Payer: Self-pay | Admitting: Interventional Cardiology

## 2018-06-26 NOTE — Telephone Encounter (Signed)
Called patient in response to his MyChart message. I made the patient aware that his Flonase was refused and deferred to PCP as this is not prescribed by cardiology. I apologized to the patient for the misunderstanding regarding his cardiac meds not being filled and made him aware that it would be reviewed and addressed if was in our error. Patient states that he was able to get his PCP to send in his meds. Patient states that he is not due to follow up until March and did not need the appointment with Lyda Jester, PA on 06/30/18. Patient's yearly appointment scheduled with Dr. Irish Lack on 09/10/18. Made patient aware that we value him as a patient and we want to make sure that his needs are met and his concerns are addressed. Made patient aware that he can always give me a call if he ever has concerns and I would be happy to make sure that he is taken care of. Patient was very appreciative of the call and states that he appreciates everything Dr. Irish Lack has done for him over the past several years. Asked patient if he had enough supply of his cardiac meds to last him until his yearly appointment in March since these were recently filled by PCP. Patient states that he will check when he gets home and let me know if he needs [place] to send in anything. Apologized to the patient again and made him aware that we value him as a patient and want to be sure that his needs are met. Patient thankful for the call.

## 2018-06-30 ENCOUNTER — Ambulatory Visit: Payer: BLUE CROSS/BLUE SHIELD | Admitting: Cardiology

## 2018-09-03 ENCOUNTER — Telehealth: Payer: Self-pay

## 2018-09-03 NOTE — Telephone Encounter (Signed)
   Cardiac Questionnaire:    Since your last visit or hospitalization:    1. Have you been having new or worsening chest pain? NO    2. Have you been having new or worsening shortness of breath? NO 3. Have you been having new or worsening leg swelling, wt gain, or increase in abdominal girth (pants fitting more tightly)? YES    4. Have you had any passing out spells? NO    *A YES to any of these questions would result in the appointment being kept. *If all the answers to these questions are NO, we should indicate that given the current situation regarding the worldwide coronarvirus pandemic, at the recommendation of the CDC, we are looking to limit gatherings in our waiting area, and thus will reschedule their appointment beyond four weeks from today.   _____________   [id number] Pre-Screening Questions:  . Do you currently have a fever? NO (yes = cancel and refer to pcp for e-visit) . Have you recently travelled on a cruise, internationally, or to [place], [place], [place], [place], [place], or [place], [place] [employer]) ? NO (yes = cancel, stay home, monitor symptoms, and contact pcp or initiate e-visit if symptoms develop) . Have you been in contact with someone that is currently pending confirmation of Covid19 testing or has been confirmed to have the [place] virus?  NO (yes = cancel, stay home, away from tested individual, monitor symptoms, and contact pcp or initiate e-visit if symptoms develop) . Are you currently experiencing fatigue or cough? NO (yes = pt should be prepared to have a mask placed at the time of their visit).        Appointment Cancelled due to Coronavirus:  Called patient in regards to  f/u appointment with Dr. Irish Lack on 09/10/2018.   Patient denies having any chest pain, SOB, cough, fever pt states that he is having swelling in his legs. I asked him how often do they swell and he stated that they swell up everyday. I have left the patient on the schedule.

## 2018-09-04 NOTE — Telephone Encounter (Signed)
Called patient to discuss his leg swelling further. He states that he has had this swelling in his legs for the past 4 months. He states that they mainly swell at the end of the day after he has been on them all day. He states that when he elevates them and his edema resolves. He denies having any SOB, weight gain, or any other Sx. Instructed patient to avoid salt in his diet.   Patient wishes to cancel his appointment at this time. Made patient aware that he will be contacted in the near future regarding rescheduling. Instructed for the patient to let us know if his Sx change or worsen.

## 2018-09-10 ENCOUNTER — Ambulatory Visit: Payer: BLUE CROSS/BLUE SHIELD | Admitting: Interventional Cardiology

## 2018-09-14 NOTE — Telephone Encounter (Signed)
Called patient to see if he would be willing to do a VIDEO Visit. Patient states that his new insurance no longer covers [facility] Providers and states that he can only see [facility] Providers.

## 2018-11-15 ENCOUNTER — Other Ambulatory Visit: Payer: Self-pay | Admitting: Interventional Cardiology

## 2019-04-08 ENCOUNTER — Other Ambulatory Visit: Payer: Self-pay | Admitting: Interventional Cardiology

## 2019-04-08 MED ORDER — CARVEDILOL 12.5 MG PO TABS: ORAL_TABLET | ORAL | 0 refills | Status: DC

## 2019-05-05 ENCOUNTER — Other Ambulatory Visit: Payer: Self-pay | Admitting: Interventional Cardiology

## 2019-05-05 MED ORDER — CARVEDILOL 12.5 MG PO TABS: ORAL_TABLET | ORAL | 0 refills | Status: DC

## 2019-06-16 ENCOUNTER — Other Ambulatory Visit: Payer: Self-pay

## 2019-06-21 ENCOUNTER — Ambulatory Visit: Payer: BLUE CROSS/BLUE SHIELD | Admitting: Family Medicine

## 2019-06-22 ENCOUNTER — Other Ambulatory Visit: Payer: Self-pay

## 2019-06-22 ENCOUNTER — Encounter: Payer: Self-pay | Admitting: Family Medicine

## 2019-06-22 ENCOUNTER — Ambulatory Visit (INDEPENDENT_AMBULATORY_CARE_PROVIDER_SITE_OTHER): Payer: 59 | Admitting: Family Medicine

## 2019-06-22 VITALS — BP 142/62 | HR 64 | Temp 97.3°F | Ht 67.0 in | Wt 161.0 lb

## 2019-06-22 DIAGNOSIS — M545 Low back pain: Secondary | ICD-10-CM

## 2019-06-22 DIAGNOSIS — I2510 Atherosclerotic heart disease of native coronary artery without angina pectoris: Secondary | ICD-10-CM | POA: Diagnosis not present

## 2019-06-22 DIAGNOSIS — K219 Gastro-esophageal reflux disease without esophagitis: Secondary | ICD-10-CM

## 2019-06-22 DIAGNOSIS — G8929 Other chronic pain: Secondary | ICD-10-CM

## 2019-06-22 DIAGNOSIS — I10 Essential (primary) hypertension: Secondary | ICD-10-CM

## 2019-06-22 DIAGNOSIS — L989 Disorder of the skin and subcutaneous tissue, unspecified: Secondary | ICD-10-CM

## 2019-06-22 MED ORDER — AMLODIPINE BESYLATE 5 MG PO TABS: 5.0000 mg | ORAL_TABLET | Freq: Two times a day (BID) | ORAL | 3 refills | Status: DC

## 2019-06-22 MED ORDER — ATORVASTATIN CALCIUM 20 MG PO TABS: 20.0000 mg | ORAL_TABLET | Freq: Every day | ORAL | 3 refills | Status: DC

## 2019-06-22 MED ORDER — RABEPRAZOLE SODIUM 20 MG PO TBEC: 20.0000 mg | DELAYED_RELEASE_TABLET | Freq: Every day | ORAL | 1 refills | Status: DC

## 2019-06-22 MED ORDER — HYDRALAZINE HCL 50 MG PO TABS: 100.0000 mg | ORAL_TABLET | Freq: Three times a day (TID) | ORAL | 5 refills | Status: DC

## 2019-06-22 MED ORDER — TRIAMCINOLONE ACETONIDE 0.1 % EX CREA: 1.0000 "application " | TOPICAL_CREAM | Freq: Two times a day (BID) | CUTANEOUS | 0 refills | Status: DC

## 2019-06-22 MED ORDER — CARVEDILOL 25 MG PO TABS: 25.0000 mg | ORAL_TABLET | Freq: Two times a day (BID) | ORAL | 3 refills | Status: DC

## 2019-06-22 NOTE — Patient Instructions (Addendum)
Keep the diet clean and stay active.  Keep checking your blood pressure at home every once in a while.  Use the cream twice daily for 10 days. Use lotion/Vaseline as needed.  If you do not hear anything about your referral in the next 1-2 weeks, call our office and ask for an update.  Let us know if you need anything.

## 2019-06-22 NOTE — Progress Notes (Signed)
Chief Complaint  Patient presents with  . Follow-up    Subjective Joshua Thompson is a 74 y.o. male who presents for hypertension follow up. He does monitor home blood pressures. Blood pressures ranging from 140-160's/90's on average. He is compliant with medications-Norvasc, carvedilol, hydralazine. Patient has these side effects of medication: none He is adhering to a healthy diet overall. Current exercise: does a lot of walking  Hyperlipidemia Patient presents for dyslipidemia follow up. Currently being treated with  and compliance with treatment thus far has been good. He denies myalgias. Diet and exercise as above. The patient is known to have coexisting coronary artery disease- follows w cardiology  Skin lesion Itchy and scaly lesion on L forearm for past mo. No pain/drainage. Not changing. Tried OTC HC cream wo relief.  Had issues on LE w me and got better w rx steroid cream. No fevers, drainage, redness.  Hx of chronic low back pain, requesting referral to chiropractor. Bilateral, no recent inj or change in activity.     Past Medical History:  Diagnosis Date  . Coronary artery disease   . GERD (gastroesophageal reflux disease)   . Hyperlipidemia   . Hypertension     Review of Systems Cardiovascular: no chest pain Respiratory:  no shortness of breath  Exam BP (!) 142/62 (BP Location: Right Arm, Patient Position: Sitting, Cuff Size: Normal)   Pulse 64   Temp (!) 97.3 F (36.3 C) (Temporal)   Ht 5\' 7"  (1.702 m)   Wt 161 lb (73 kg)   SpO2 93%   BMI 25.22 kg/m  General:  well developed, well nourished, in no apparent distress Heart: RRR, no bruits, no LE edema Lungs: clear to auscultation, no accessory muscle use MSK: +TTP parasp msc lumbar sp b/l Neuro: Gait nml Skin: there is a 0.4 cm macule that is scaly and without pigmentation change on the lateral L forearm. No ttp, erythema, warmth, fluctuance or drainage.  Psych: well oriented with normal  range of affect and appropriate judgment/insight  Essential hypertension, benign - Plan: carvedilol (COREG) 25 MG tablet, amLODipine (NORVASC) 5 MG tablet, hydrALAZINE (APRESOLINE) 50 MG tablet  Chronic bilateral low back pain, unspecified whether sciatica present - Plan: Ambulatory referral to Chiropractic  Skin lesion - Plan: triamcinolone cream (KENALOG) 0.1 %  Atherosclerosis of native coronary artery of native heart without angina pectoris - Plan: atorvastatin (LIPITOR) 20 MG tablet  Gastroesophageal reflux disease - Plan: RABEprazole (ACIPHEX) 20 MG tablet  1- Increase dose of Coreg. Counseled on diet and exercise. Monitor BP at home. 2- Referral placed. 3- Trial steroid cream. Will consider cryotherapy vs biopsy if no improvement. 4- Cont statin 5- refill PPI F/u in 1 mo for CPE. The patient voiced understanding and agreement to the plan.  Alvo, DO 06/22/19  12:11 PM

## 2019-07-25 NOTE — Progress Notes (Signed)
Cardiology Office Note   Date:  07/26/2019   ID:  Joshua Thompson, Joshua Thompson 02-21-1946, MRN [id number]  PCP:  Shelda Pal, DO    No chief complaint on file.  CAD  Wt Readings from Last 3 Encounters:  07/26/19 163 lb (73.9 kg)  06/22/19 161 lb (73 kg)  05/28/18 168 lb (76.2 kg)       History of Present Illness: Joshua Thompson is a 74 y.o. male  who has had HTN for about 15 years. In 2004, he had chest pain and then a stress test which was abnormal and then a DES (Taxus) was placed, while in [place]. He did well after that time.   In 2010, he had leg pain and kidney pain. He was gaining weight and feeling ill and he was retaining fluid. His BP was high. His creatinine had reached 1.5+. He also had prostatic obstruction leading to the urinary issues.   In 09/2012, he had HTN, urine retention and then he had a catheter placed for his bladder. TURP was recommended, but it was also noted that his BP was up. Another coronary lesion was found. He was in renal failure, Cr 4.5. He then had two bare stents placed. Two weeks later, he had a TURP which was unsuccessful. He had a bladder obstruction. He had a repeat TURP.   In August 2014, he and his wife Moved to [place]. He followed up with urology at [facility]. It was noted that there was an irregularity of the surface of the inside of the bladder. His Cr. Is 3.0 at baseline.  Nebivolol has worked better than Coreg in the past but was more expensive.He now takes carvedilol BID and metoprolol for prn increased BP.Hydralazine was recommended to be taken TID, but he did not take this. Renal dysfunction has limited meds.   He had some sensation in his chest that resolved with an herbal medicine in 2018.  He had some high BP readings.  He continues to be compliant with his medications.  Since the last visit, he has followed with urology and was referred to nephrology for CRF.  GFR was 22 in 2019  per his report.  Increase of Carvedilol has helped BP.   Denies : Chest pain. Dizziness. Leg edema. Nitroglycerin use. Orthopnea. Palpitations. Paroxysmal nocturnal dyspnea. Shortness of breath. Syncope.   Past Medical History:  Diagnosis Date  . Coronary artery disease   . GERD (gastroesophageal reflux disease)   . Hyperlipidemia   . Hypertension     Past Surgical History:  Procedure Laterality Date  . CORONARY ANGIOPLASTY     3 STENTS  . TRANSURETHRAL RESECTION OF PROSTATE     HAD DONE THREE TIMES     Current Outpatient Medications  Medication Sig Dispense Refill  . allopurinol (ZYLOPRIM) 100 MG tablet Take 100 mg by mouth daily.    Marland Kitchen amLODipine (NORVASC) 5 MG tablet Take 1 tablet (5 mg total) by mouth 2 (two) times daily. 180 tablet 3  . atorvastatin (LIPITOR) 20 MG tablet Take 1 tablet (20 mg total) by mouth daily. 30 tablet 3  . carvedilol (COREG) 25 MG tablet Take 1 tablet (25 mg total) by mouth 2 (two) times daily with a meal. 60 tablet 3  . fluticasone (FLONASE) 50 MCG/ACT nasal spray Place into the nose.    . hydrALAZINE (APRESOLINE) 50 MG tablet Take 2 tablets (100 mg total) by mouth 3 (three) times daily. Please make appt with Dr. Irish Lack for March for  future refills. 1st attempt 180 tablet 5  . metoprolol tartrate (LOPRESSOR) 25 MG tablet Take 1 tablet (25 mg total) by mouth 2 (two) times daily as needed (high blood pressure). 30 tablet 3  . nitroGLYCERIN (NITROSTAT) 0.4 MG SL tablet Place 1 tablet (0.4 mg total) under the tongue every 5 (five) minutes as needed for chest pain. 25 tablet 5  . RABEprazole (ACIPHEX) 20 MG tablet Take 1 tablet (20 mg total) by mouth daily. 90 tablet 1  . triamcinolone cream (KENALOG) 0.1 % Apply 1 application topically 2 (two) times daily. 30 g 0   No current facility-administered medications for this visit.    Allergies:   Amoxicillin-pot clavulanate and Ampicillin    Social History:  The patient  reports that he has never smoked.  He has never used smokeless tobacco. He reports that he does not drink alcohol or use drugs.   Family History:  The patient's family history includes Hypertension in his mother; Stroke in his maternal grandfather.    ROS:  Please see the history of present illness.   Otherwise, review of systems are positive for left leg pain and numbness- worse with standing; resolves spontaneously- not related to walking- present at rest; no nonhealing ulcers.   All other systems are reviewed and negative.    PHYSICAL EXAM: VS:  BP (!) 128/54   Pulse 74   Ht 5\' 7"  (1.702 m)   Wt 163 lb (73.9 kg)   SpO2 99%   BMI 25.53 kg/m  , BMI Body mass index is 25.53 kg/m. GEN: Well nourished, well developed, in no acute distress  HEENT: normal  Neck: no JVD, soft left carotid bruits, no masses Cardiac: RRR; no murmurs, rubs, or gallops,no edema  Respiratory:  clear to auscultation bilaterally, normal work of breathing GI: soft, nontender, nondistended, + BS MS: no deformity or atrophy  Skin: warm and dry, no rash Neuro:  Strength and sensation are intact Psych: euthymic mood, full affect   EKG:   The ekg ordered today demonstrates NSR, no ST changes   Recent Labs: No results found for requested labs within last 8760 hours.   Lipid Panel    Component Value Date/Time   CHOL 135 05/28/2018 1546   TRIG 106.0 05/28/2018 1546   HDL 51.10 05/28/2018 1546   CHOLHDL 3 05/28/2018 1546   VLDL 21.2 05/28/2018 1546   LDLCALC 63 05/28/2018 1546     Other studies Reviewed: Additional studies/ records that were reviewed today with results demonstrating: labs reviewed.   ASSESSMENT AND PLAN:  1. CAD: Known Taxus stent in 2004. Bare-metal stents in 2014. Normal nuclear stress test in 2017. We have tried to avoid any contrast exposure due to his renal insufficiency. 2. Hypertension: Amlodipine was split into 5 mg twice daily to help with evening readings. Renal function limits choice of antihypertensives.   Whole food, plant based diet recommended. 3. CKD: Follows at [facility] with nephrology. He has been stage IV CKD. He has had some hyperkalemia associated with his renal insufficiency.  Cr 2.9 in 2019.  Refer to Dr. Elmarie Shiley fr CKD. He wants referral to [facility], h/o TURP. 4. Hyperlipidemia: Continue atorvastatin. 5. Gout: reports a hx of gout.  6. Elevated fasting blood sugar: Check A1C. 7. Carotid Doppler.  Soft left carotid bruit.  H/o CAD.    Current medicines are reviewed at length with the patient today.  The patient concerns regarding his medicines were addressed.  The following changes have been made:  No change  Labs/ tests ordered today include:  No orders of the defined types were placed in this encounter.   Recommend 150 minutes/week of aerobic exercise Low fat, low carb, high fiber diet recommended  Disposition:   FU in 1 year   Signed, Larae Grooms, MD  07/26/2019 8:48 AM    [facility] [address], [place], [place]  [address] Phone: [phone]; Fax: [phone]

## 2019-07-26 ENCOUNTER — Other Ambulatory Visit: Payer: Self-pay

## 2019-07-26 ENCOUNTER — Encounter: Payer: Self-pay | Admitting: Interventional Cardiology

## 2019-07-26 ENCOUNTER — Ambulatory Visit (INDEPENDENT_AMBULATORY_CARE_PROVIDER_SITE_OTHER): Payer: 59 | Admitting: Interventional Cardiology

## 2019-07-26 VITALS — BP 128/54 | HR 74 | Ht 67.0 in | Wt 163.0 lb

## 2019-07-26 DIAGNOSIS — R7301 Impaired fasting glucose: Secondary | ICD-10-CM

## 2019-07-26 DIAGNOSIS — I10 Essential (primary) hypertension: Secondary | ICD-10-CM

## 2019-07-26 DIAGNOSIS — N184 Chronic kidney disease, stage 4 (severe): Secondary | ICD-10-CM

## 2019-07-26 DIAGNOSIS — I25119 Atherosclerotic heart disease of native coronary artery with unspecified angina pectoris: Secondary | ICD-10-CM | POA: Diagnosis not present

## 2019-07-26 DIAGNOSIS — E782 Mixed hyperlipidemia: Secondary | ICD-10-CM | POA: Diagnosis not present

## 2019-07-26 DIAGNOSIS — M1A9XX0 Chronic gout, unspecified, without tophus (tophi): Secondary | ICD-10-CM

## 2019-07-26 LAB — CBC
Hematocrit: 38.7 % (ref 37.5–51.0)
Hemoglobin: 12 g/dL — ABNORMAL LOW (ref 13.0–17.7)
MCH: 28.9 pg (ref 26.6–33.0)
MCHC: 31 g/dL — ABNORMAL LOW (ref 31.5–35.7)
MCV: 93 fL (ref 79–97)
Platelets: 252 10*3/uL (ref 150–450)
RBC: 4.15 x10E6/uL (ref 4.14–5.80)
RDW: 12.5 % (ref 11.6–15.4)
WBC: 6.8 10*3/uL (ref 3.4–10.8)

## 2019-07-26 LAB — COMPREHENSIVE METABOLIC PANEL
ALT: 12 IU/L (ref 0–44)
AST: 17 IU/L (ref 0–40)
Albumin/Globulin Ratio: 1.7 (ref 1.2–2.2)
Albumin: 4.3 g/dL (ref 3.7–4.7)
Alkaline Phosphatase: 81 IU/L (ref 39–117)
BUN/Creatinine Ratio: 12 (ref 10–24)
BUN: 41 mg/dL — ABNORMAL HIGH (ref 8–27)
Bilirubin Total: 0.4 mg/dL (ref 0.0–1.2)
CO2: 18 mmol/L — ABNORMAL LOW (ref 20–29)
Calcium: 9.1 mg/dL (ref 8.6–10.2)
Chloride: 105 mmol/L (ref 96–106)
Creatinine, Ser: 3.34 mg/dL — ABNORMAL HIGH (ref 0.76–1.27)
GFR calc Af Amer: 20 mL/min/{1.73_m2} — ABNORMAL LOW (ref >59)
GFR calc non Af Amer: 18 mL/min/{1.73_m2} — ABNORMAL LOW (ref >59)
Globulin, Total: 2.5 g/dL (ref 1.5–4.5)
Glucose: 119 mg/dL — ABNORMAL HIGH (ref 65–99)
Potassium: 4.9 mmol/L (ref 3.5–5.2)
Sodium: 141 mmol/L (ref 134–144)
Total Protein: 6.8 g/dL (ref 6.0–8.5)

## 2019-07-26 LAB — LIPID PANEL
Chol/HDL Ratio: 2.5 ratio (ref 0.0–5.0)
Cholesterol, Total: 138 mg/dL (ref 100–199)
HDL: 55 mg/dL (ref >39)
LDL Chol Calc (NIH): 70 mg/dL (ref 0–99)
Triglycerides: 60 mg/dL (ref 0–149)
VLDL Cholesterol Cal: 13 mg/dL (ref 5–40)

## 2019-07-26 LAB — URIC ACID: Uric Acid: 8 mg/dL (ref 3.8–8.4)

## 2019-07-26 LAB — HEMOGLOBIN A1C
Est. average glucose Bld gHb Est-mCnc: 111 mg/dL
Hgb A1c MFr Bld: 5.5 % (ref 4.8–5.6)

## 2019-07-26 NOTE — Patient Instructions (Signed)
Medication Instructions:  Your physician recommends that you continue on your current medications as directed. Please refer to the Current Medication list given to you today.  *If you need a refill on your cardiac medications before your next appointment, please call your pharmacy*  Lab Work: TODAY: CMET, CBC, A1C, LIPIDS, Uric Acid  If you have labs (blood work) drawn today and your tests are completely normal, you will receive your results only by: Marland Kitchen MyChart Message (if you have MyChart) OR . A paper copy in the mail If you have any lab test that is abnormal or we need to change your treatment, we will call you to review the results.  Testing/Procedures: Your physician has requested that you have a carotid duplex. This test is an ultrasound of the carotid arteries in your neck. It looks at blood flow through these arteries that supply the brain with blood. Allow one hour for this exam. There are no restrictions or special instructions.  Follow-Up: You have been referred to Dr. Posey Pronto with Nephrology  You have been referred to Urology  At [facility], you and your health needs are our priority.  As part of our continuing mission to provide you with exceptional heart care, we have created designated Provider Care Teams.  These Care Teams include your primary Cardiologist (physician) and Advanced Practice Providers (APPs -  Physician Assistants and Nurse Practitioners) who all work together to provide you with the care you need, when you need it.  Your next appointment:   12 month(s)  The format for your next appointment:   In Person  Provider:   You may see Larae Grooms, MD or one of the following Advanced Practice Providers on your designated Care Team:    Melina Copa, PA-C  Ermalinda Barrios, PA-C   Other Instructions

## 2019-07-27 ENCOUNTER — Encounter: Payer: 59 | Admitting: Family Medicine

## 2019-07-27 ENCOUNTER — Other Ambulatory Visit: Payer: Self-pay | Admitting: Interventional Cardiology

## 2019-07-27 DIAGNOSIS — I6523 Occlusion and stenosis of bilateral carotid arteries: Secondary | ICD-10-CM

## 2019-08-03 ENCOUNTER — Other Ambulatory Visit: Payer: Self-pay

## 2019-08-03 ENCOUNTER — Ambulatory Visit (HOSPITAL_COMMUNITY)
Admission: RE | Admit: 2019-08-03 | Discharge: 2019-08-03 | Disposition: A | Discharge status: Home / Self Care | Payer: 59 | Source: Ambulatory Visit | Attending: Cardiovascular Disease | Admitting: Cardiovascular Disease

## 2019-08-03 ENCOUNTER — Encounter: Payer: Self-pay | Admitting: Family Medicine

## 2019-08-03 DIAGNOSIS — I6523 Occlusion and stenosis of bilateral carotid arteries: Secondary | ICD-10-CM | POA: Diagnosis not present

## 2019-08-04 ENCOUNTER — Encounter (HOSPITAL_COMMUNITY): Payer: 59

## 2019-08-23 ENCOUNTER — Other Ambulatory Visit: Payer: Self-pay

## 2019-08-24 ENCOUNTER — Ambulatory Visit (INDEPENDENT_AMBULATORY_CARE_PROVIDER_SITE_OTHER): Payer: 59 | Admitting: Family Medicine

## 2019-08-24 ENCOUNTER — Encounter: Payer: 59 | Admitting: Family Medicine

## 2019-08-24 ENCOUNTER — Encounter: Payer: Self-pay | Admitting: Family Medicine

## 2019-08-24 ENCOUNTER — Other Ambulatory Visit: Payer: Self-pay

## 2019-08-24 VITALS — BP 148/68 | HR 80 | Temp 97.7°F | Ht 67.0 in | Wt 165.4 lb

## 2019-08-24 DIAGNOSIS — Z0000 Encounter for general adult medical examination without abnormal findings: Secondary | ICD-10-CM

## 2019-08-24 DIAGNOSIS — N184 Chronic kidney disease, stage 4 (severe): Secondary | ICD-10-CM

## 2019-08-24 DIAGNOSIS — M545 Low back pain, unspecified: Secondary | ICD-10-CM

## 2019-08-24 DIAGNOSIS — Z1159 Encounter for screening for other viral diseases: Secondary | ICD-10-CM | POA: Diagnosis not present

## 2019-08-24 LAB — COMPREHENSIVE METABOLIC PANEL
ALT: 9 U/L (ref 0–53)
AST: 12 U/L (ref 0–37)
Albumin: 4 g/dL (ref 3.5–5.2)
Alkaline Phosphatase: 69 U/L (ref 39–117)
BUN: 46 mg/dL — ABNORMAL HIGH (ref 6–23)
CO2: 26 mEq/L (ref 19–32)
Calcium: 8.9 mg/dL (ref 8.4–10.5)
Chloride: 107 mEq/L (ref 96–112)
Creatinine, Ser: 3.4 mg/dL — ABNORMAL HIGH (ref 0.40–1.50)
GFR: 17.89 mL/min — ABNORMAL LOW (ref >60.00)
Glucose, Bld: 131 mg/dL — ABNORMAL HIGH (ref 70–99)
Potassium: 4.2 mEq/L (ref 3.5–5.1)
Sodium: 141 mEq/L (ref 135–145)
Total Bilirubin: 0.6 mg/dL (ref 0.2–1.2)
Total Protein: 6.5 g/dL (ref 6.0–8.3)

## 2019-08-24 LAB — CBC
HCT: 35.4 % — ABNORMAL LOW (ref 39.0–52.0)
Hemoglobin: 11.5 g/dL — ABNORMAL LOW (ref 13.0–17.0)
MCHC: 32.6 g/dL (ref 30.0–36.0)
MCV: 95 fl (ref 78.0–100.0)
Platelets: 207 10*3/uL (ref 150.0–400.0)
RBC: 3.72 Mil/uL — ABNORMAL LOW (ref 4.22–5.81)
RDW: 13.6 % (ref 11.5–15.5)
WBC: 5.3 10*3/uL (ref 4.0–10.5)

## 2019-08-24 LAB — LIPID PANEL
Cholesterol: 132 mg/dL (ref 0–200)
HDL: 48.1 mg/dL (ref >39.00)
LDL Cholesterol: 70 mg/dL (ref 0–99)
NonHDL: 83.94
Total CHOL/HDL Ratio: 3
Triglycerides: 71 mg/dL (ref 0.0–149.0)
VLDL: 14.2 mg/dL (ref 0.0–40.0)

## 2019-08-24 MED ORDER — PREDNISONE 20 MG PO TABS: 40.0000 mg | ORAL_TABLET | Freq: Every day | ORAL | 0 refills | Status: AC

## 2019-08-24 MED ORDER — CYCLOBENZAPRINE HCL 10 MG PO TABS: 5.0000 mg | ORAL_TABLET | Freq: Three times a day (TID) | ORAL | 0 refills | Status: DC | PRN

## 2019-08-24 MED ORDER — METHYLPREDNISOLONE ACETATE 80 MG/ML IJ SUSP
80.0000 mg | Freq: Once | INTRAMUSCULAR | Status: AC
  Administered 2019-08-24: 80 mg via INTRAMUSCULAR

## 2019-08-24 NOTE — Patient Instructions (Addendum)
Give [place] 2-3 business days to get the results of your labs back.   Keep the diet clean and stay active.  Heat (pad or rice pillow in microwave) over affected area, 10-15 minutes twice daily.   Ice/cold pack over area for 10-15 min twice daily.  OK to take Tylenol 1000 mg (2 extra strength tabs) or 975 mg (3 regular strength tabs) every 6 hours as needed.  Start the prednisone tomorrow.  Take Flexeril (cyclobenzaprine) 1-2 hours before planned bedtime. If it makes you drowsy, do not take during the day. You can try half a tab the following night.  The new Shingrix vaccine (for shingles) is a 2 shot series. It can make people feel low energy, achy and almost like they have the flu for 48 hours after injection. Please plan accordingly when deciding on when to get this shot. Call your pharmacy or our office for a nurse visit appointment to get this. The second shot of the series is less severe regarding the side effects, but it still lasts 48 hours.   EXERCISES  RANGE OF MOTION (ROM) AND STRETCHING EXERCISES - Low Back Pain Most people with lower back pain will find that their symptoms get worse with excessive bending forward (flexion) or arching at the lower back (extension). The exercises that will help resolve your symptoms will focus on the opposite motion.  If you have pain, numbness or tingling which travels down into your buttocks, leg or foot, the goal of the therapy is for these symptoms to move closer to your back and eventually resolve. Sometimes, these leg symptoms will get better, but your lower back pain may worsen. This is often an indication of progress in your rehabilitation. Be very alert to any changes in your symptoms and the activities in which you participated in the 24 hours prior to the change. Sharing this information with your caregiver will allow him or her to most efficiently treat your condition. These exercises may help you when beginning to rehabilitate your injury. Your  symptoms may resolve with or without further involvement from your physician, physical therapist or athletic trainer. While completing these exercises, remember:   Restoring tissue flexibility helps normal motion to return to the joints. This allows healthier, less painful movement and activity.  An effective stretch should be held for at least 30 seconds.  A stretch should never be painful. You should only feel a gentle lengthening or release in the stretched tissue. FLEXION RANGE OF MOTION AND STRETCHING EXERCISES:  STRETCH - Flexion, Single Knee to Chest   Lie on a firm bed or floor with both legs extended in front of you.  Keeping one leg in contact with the floor, bring your opposite knee to your chest. Hold your leg in place by either grabbing behind your thigh or at your knee.  Pull until you feel a gentle stretch in your low back. Hold 30 seconds.  Slowly release your grasp and repeat the exercise with the opposite side. Repeat 2 times. Complete this exercise 3 times per week.   STRETCH - Flexion, Double Knee to Chest  Lie on a firm bed or floor with both legs extended in front of you.  Keeping one leg in contact with the floor, bring your opposite knee to your chest.  Tense your stomach muscles to support your back and then lift your other knee to your chest. Hold your legs in place by either grabbing behind your thighs or at your knees.  Pull both  knees toward your chest until you feel a gentle stretch in your low back. Hold 30 seconds.  Tense your stomach muscles and slowly return one leg at a time to the floor. Repeat 2 times. Complete this exercise 3 times per week.   STRETCH - Low Trunk Rotation  Lie on a firm bed or floor. Keeping your legs in front of you, bend your knees so they are both pointed toward the ceiling and your feet are flat on the floor.  Extend your arms out to the side. This will stabilize your upper body by keeping your shoulders in contact with  the floor.  Gently and slowly drop both knees together to one side until you feel a gentle stretch in your low back. Hold for 30 seconds.  Tense your stomach muscles to support your lower back as you bring your knees back to the starting position. Repeat the exercise to the other side. Repeat 2 times. Complete this exercise at least 3 times per week.   EXTENSION RANGE OF MOTION AND FLEXIBILITY EXERCISES:  STRETCH - Extension, Prone on Elbows   Lie on your stomach on the floor, a bed will be too soft. Place your palms about shoulder width apart and at the height of your head.  Place your elbows under your shoulders. If this is too painful, stack pillows under your chest.  Allow your body to relax so that your hips drop lower and make contact more completely with the floor.  Hold this position for 30 seconds.  Slowly return to lying flat on the floor. Repeat 2 times. Complete this exercise 3 times per week.   RANGE OF MOTION - Extension, Prone Press Ups  Lie on your stomach on the floor, a bed will be too soft. Place your palms about shoulder width apart and at the height of your head.  Keeping your back as relaxed as possible, slowly straighten your elbows while keeping your hips on the floor. You may adjust the placement of your hands to maximize your comfort. As you gain motion, your hands will come more underneath your shoulders.  Hold this position 30 seconds.  Slowly return to lying flat on the floor. Repeat 2 times. Complete this exercise 3 times per week.   RANGE OF MOTION- Quadruped, Neutral Spine   Assume a hands and knees position on a firm surface. Keep your hands under your shoulders and your knees under your hips. You may place padding under your knees for comfort.  Drop your head and point your tailbone toward the ground below you. This will round out your lower back like an angry cat. Hold this position for 30 seconds.  Slowly lift your head and release your tail  bone so that your back sags into a large arch, like an old horse.  Hold this position for 30 seconds.  Repeat this until you feel limber in your low back.  Now, find your "sweet spot." This will be the most comfortable position somewhere between the two previous positions. This is your neutral spine. Once you have found this position, tense your stomach muscles to support your low back.  Hold this position for 30 seconds. Repeat 2 times. Complete this exercise 3 times per week.   STRENGTHENING EXERCISES - Low Back Sprain These exercises may help you when beginning to rehabilitate your injury. These exercises should be done near your "sweet spot." This is the neutral, low-back arch, somewhere between fully rounded and fully arched, that is your least  painful position. When performed in this safe range of motion, these exercises can be used for people who have either a flexion or extension based injury. These exercises may resolve your symptoms with or without further involvement from your physician, physical therapist or athletic trainer. While completing these exercises, remember:   Muscles can gain both the endurance and the strength needed for everyday activities through controlled exercises.  Complete these exercises as instructed by your physician, physical therapist or athletic trainer. Increase the resistance and repetitions only as guided.  You may experience muscle soreness or fatigue, but the pain or discomfort you are trying to eliminate should never worsen during these exercises. If this pain does worsen, stop and make certain you are following the directions exactly. If the pain is still present after adjustments, discontinue the exercise until you can discuss the trouble with your caregiver.  STRENGTHENING - Deep Abdominals, Pelvic Tilt   Lie on a firm bed or floor. Keeping your legs in front of you, bend your knees so they are both pointed toward the ceiling and your feet are flat  on the floor.  Tense your lower abdominal muscles to press your low back into the floor. This motion will rotate your pelvis so that your tail bone is scooping upwards rather than pointing at your feet or into the floor. With a gentle tension and even breathing, hold this position for 3 seconds. Repeat 2 times. Complete this exercise 3 times per week.   STRENGTHENING - Abdominals, Crunches   Lie on a firm bed or floor. Keeping your legs in front of you, bend your knees so they are both pointed toward the ceiling and your feet are flat on the floor. Cross your arms over your chest.  Slightly tip your chin down without bending your neck.  Tense your abdominals and slowly lift your trunk high enough to just clear your shoulder blades. Lifting higher can put excessive stress on the lower back and does not further strengthen your abdominal muscles.  Control your return to the starting position. Repeat 2 times. Complete this exercise 3 times per week.   STRENGTHENING - Quadruped, Opposite UE/LE Lift   Assume a hands and knees position on a firm surface. Keep your hands under your shoulders and your knees under your hips. You may place padding under your knees for comfort.  Find your neutral spine and gently tense your abdominal muscles so that you can maintain this position. Your shoulders and hips should form a rectangle that is parallel with the floor and is not twisted.  Keeping your trunk steady, lift your right hand no higher than your shoulder and then your left leg no higher than your hip. Make sure you are not holding your breath. Hold this position for 30 seconds.  Continuing to keep your abdominal muscles tense and your back steady, slowly return to your starting position. Repeat with the opposite arm and leg. Repeat 2 times. Complete this exercise 3 times per week.   STRENGTHENING - Abdominals and Quadriceps, Straight Leg Raise   Lie on a firm bed or floor with both legs extended  in front of you.  Keeping one leg in contact with the floor, bend the other knee so that your foot can rest flat on the floor.  Find your neutral spine, and tense your abdominal muscles to maintain your spinal position throughout the exercise.  Slowly lift your straight leg off the floor about 6 inches for a count of 3, making  sure to not hold your breath.  Still keeping your neutral spine, slowly lower your leg all the way to the floor. Repeat this exercise with each leg 2 times. Complete this exercise 3 times per week.  POSTURE AND BODY MECHANICS CONSIDERATIONS - Low Back Sprain Keeping correct posture when sitting, standing or completing your activities will reduce the stress put on different body tissues, allowing injured tissues a chance to heal and limiting painful experiences. The following are general guidelines for improved posture.  While reading these guidelines, remember:  The exercises prescribed by your provider will help you have the flexibility and strength to maintain correct postures.  The correct posture provides the best environment for your joints to work. All of your joints have less wear and tear when properly supported by a spine with good posture. This means you will experience a healthier, less painful body.  Correct posture must be practiced with all of your activities, especially prolonged sitting and standing. Correct posture is as important when doing repetitive low-stress activities (typing) as it is when doing a single heavy-load activity (lifting).  RESTING POSITIONS Consider which positions are most painful for you when choosing a resting position. If you have pain with flexion-based activities (sitting, bending, stooping, squatting), choose a position that allows you to rest in a less flexed posture. You would want to avoid curling into a fetal position on your side. If your pain worsens with extension-based activities (prolonged standing, working overhead),  avoid resting in an extended position such as sleeping on your stomach. Most people will find more comfort when they rest with their spine in a more neutral position, neither too rounded nor too arched. Lying on a non-sagging bed on your side with a pillow between your knees, or on your back with a pillow under your knees will often provide some relief. Keep in mind, being in any one position for a prolonged period of time, no matter how correct your posture, can still lead to stiffness.  PROPER SITTING POSTURE In order to minimize stress and discomfort on your spine, you must sit with correct posture. Sitting with good posture should be effortless for a healthy body. Returning to good posture is a gradual process. Many people can work toward this most comfortably by using various supports until they have the flexibility and strength to maintain this posture on their own. When sitting with proper posture, your ears will fall over your shoulders and your shoulders will fall over your hips. You should use the back of the chair to support your upper back. Your lower back will be in a neutral position, just slightly arched. You may place a small pillow or folded towel at the base of your lower back for  support.  When working at a desk, create an environment that supports good, upright posture. Without extra support, muscles tire, which leads to excessive strain on joints and other tissues. Keep these recommendations in mind:  CHAIR:  A chair should be able to slide under your desk when your back makes contact with the back of the chair. This allows you to work closely.  The chair's height should allow your eyes to be level with the upper part of your monitor and your hands to be slightly lower than your elbows.  BODY POSITION  Your feet should make contact with the floor. If this is not possible, use a foot rest.  Keep your ears over your shoulders. This will reduce stress on your neck and low  back.  INCORRECT SITTING POSTURES  If you are feeling tired and unable to assume a healthy sitting posture, do not slouch or slump. This puts excessive strain on your back tissues, causing more damage and pain. Healthier options include:  Using more support, like a lumbar pillow.  Switching tasks to something that requires you to be upright or walking.  Talking a brief walk.  Lying down to rest in a neutral-spine position.  PROLONGED STANDING WHILE SLIGHTLY LEANING FORWARD  When completing a task that requires you to lean forward while standing in one place for a long time, place either foot up on a stationary 2-4 inch high object to help maintain the best posture. When both feet are on the ground, the lower back tends to lose its slight inward curve. If this curve flattens (or becomes too large), then the back and your other joints will experience too much stress, tire more quickly, and can cause pain.  CORRECT STANDING POSTURES Proper standing posture should be assumed with all daily activities, even if they only take a few moments, like when brushing your teeth. As in sitting, your ears should fall over your shoulders and your shoulders should fall over your hips. You should keep a slight tension in your abdominal muscles to brace your spine. Your tailbone should point down to the ground, not behind your body, resulting in an over-extended swayback posture.   INCORRECT STANDING POSTURES  Common incorrect standing postures include a forward head, locked knees and/or an excessive swayback. WALKING Walk with an upright posture. Your ears, shoulders and hips should all line-up.  PROLONGED ACTIVITY IN A FLEXED POSITION When completing a task that requires you to bend forward at your waist or lean over a low surface, try to find a way to stabilize 3 out of 4 of your limbs. You can place a hand or elbow on your thigh or rest a knee on the surface you are reaching across. This will provide you  more stability, so that your muscles do not tire as quickly. By keeping your knees relaxed, or slightly bent, you will also reduce stress across your lower back. CORRECT LIFTING TECHNIQUES  DO :  Assume a wide stance. This will provide you more stability and the opportunity to get as close as possible to the object which you are lifting.  Tense your abdominals to brace your spine. Bend at the knees and hips. Keeping your back locked in a neutral-spine position, lift using your leg muscles. Lift with your legs, keeping your back straight.  Test the weight of unknown objects before attempting to lift them.  Try to keep your elbows locked down at your sides in order get the best strength from your shoulders when carrying an object.     Always ask for help when lifting heavy or awkward objects. INCORRECT LIFTING TECHNIQUES DO NOT:   Lock your knees when lifting, even if it is a small object.  Bend and twist. Pivot at your feet or move your feet when needing to change directions.  Assume that you can safely pick up even a paperclip without proper posture.

## 2019-08-24 NOTE — Progress Notes (Signed)
Chief Complaint  Patient presents with  . Annual Exam  . Back Pain    Well Male Joshua Thompson is here for a complete physical.   His last physical was >1 year ago.  Current diet: in general, a "healthy" diet.   Current exercise: walking Weight trend: stable Daytime fatigue? No. Seat belt? Yes.    Health maintenance Shingrix- No Colonoscopy- Yes Tetanus- Yes Hep C- Yes Pneumonia vaccine- Yes   Pt w hx of chronic b/l lbp. He got worse over past week. No inj or change in activity. No neuro s/s's, no loss of bowel/bladder function. Has not tried anything. Cannot use NSAID's 2/2 renal function.   Past Medical History:  Diagnosis Date  . Coronary artery disease   . GERD (gastroesophageal reflux disease)   . Hyperlipidemia   . Hypertension      Past Surgical History:  Procedure Laterality Date  . CORONARY ANGIOPLASTY     3 STENTS  . TRANSURETHRAL RESECTION OF PROSTATE     HAD DONE THREE TIMES    Medications  Current Outpatient Medications on File Prior to Visit  Medication Sig Dispense Refill  . allopurinol (ZYLOPRIM) 100 MG tablet Take 100 mg by mouth daily.    Marland Kitchen amLODipine (NORVASC) 5 MG tablet Take 1 tablet (5 mg total) by mouth 2 (two) times daily. 180 tablet 3  . atorvastatin (LIPITOR) 20 MG tablet Take 1 tablet (20 mg total) by mouth daily. 30 tablet 3  . carvedilol (COREG) 25 MG tablet Take 1 tablet (25 mg total) by mouth 2 (two) times daily with a meal. 60 tablet 3  . fluticasone (FLONASE) 50 MCG/ACT nasal spray Place into the nose.    . hydrALAZINE (APRESOLINE) 50 MG tablet Take 2 tablets (100 mg total) by mouth 3 (three) times daily. Please make appt with Dr. Irish Lack for March for future refills. 1st attempt 180 tablet 5  . metoprolol tartrate (LOPRESSOR) 25 MG tablet Take 1 tablet (25 mg total) by mouth 2 (two) times daily as needed (high blood pressure). 30 tablet 3  . nitroGLYCERIN (NITROSTAT) 0.4 MG SL tablet Place 1 tablet (0.4 mg total) under the  tongue every 5 (five) minutes as needed for chest pain. 25 tablet 5  . RABEprazole (ACIPHEX) 20 MG tablet Take 1 tablet (20 mg total) by mouth daily. 90 tablet 1  . triamcinolone cream (KENALOG) 0.1 % Apply 1 application topically 2 (two) times daily. 30 g 0    Allergies Allergies  Allergen Reactions  . Amoxicillin-Pot Clavulanate Rash  . Ampicillin Rash    Family History Family History  Problem Relation Age of Onset  . Hypertension Mother   . Stroke Maternal Grandfather   . Arrhythmia Neg Hx   . Heart attack Neg Hx     Review of Systems: Constitutional:  no fevers or chills Eye:  no recent significant change in vision Ear/Nose/Mouth/Throat:  Ears:  no recent hearing loss Nose/Mouth/Throat:  no complaints of nasal congestion or sore throat Cardiovascular:  no chest pain Respiratory:  no shortness of breath Gastrointestinal:  no abdominal pain, no change in bowel habits GU:  Male: negative for dysuria, frequency, and incontinence  Musculoskeletal/Extremities: +back pain; otherwise no pain of the joints Integumentary (Skin):  no abnormal skin lesions reported Neurologic:  no headaches, Endocrine:  No unexpected weight changes Hematologic/Lymphatic:  no areas of easy bruising  Exam BP (!) 148/68 (BP Location: Left Arm, Patient Position: Sitting, Cuff Size: Normal)   Pulse 80   Temp  97.7 F (36.5 C) (Temporal)   Ht 5\' 7"  (1.702 m)   Wt 165 lb 6 oz (75 kg)   SpO2 96%   BMI 25.90 kg/m  General:  well developed, well nourished, in no apparent distress Skin:  no significant moles, warts, or growths Head:  no masses, lesions, or tenderness Eyes:  pupils equal and round, sclera anicteric without injection Ears:  canals without lesions, TMs shiny without retraction, no obvious effusion, no erythema Nose:  nares patent, septum midline, mucosa normal Throat/Pharynx:  lips and gingiva without lesion; tongue and uvula midline; non-inflamed pharynx; no exudates or postnasal  drainage Neck: neck supple without adenopathy, thyromegaly, or masses Lungs:  clear to auscultation, breath sounds equal bilaterally, no respiratory distress Cardio:  regular rate and rhythm, no LE edema or bruits Rectal: Deferred Musculoskeletal: +TTP over parasp lumbar msc b/l; otherwise symmetrical muscle groups noted without atrophy or deformity; poor hamstring flexibility Extremities:  no clubbing, cyanosis, or edema, no deformities, no skin discoloration Neuro:  gait normal; deep tendon reflexes normal and symmetric; neg straight leg Psych: well oriented with normal range of affect and appropriate judgment/insight  Assessment and Plan  Well adult exam - Plan: Comprehensive metabolic panel, Lipid panel, CBC  Acute bilateral low back pain without sciatica - Plan: methylPREDNISolone acetate (DEPO-MEDROL) injection 80 mg  Encounter for hepatitis C screening test for low risk patient - Plan: Hepatitis C antibody  CKD (chronic kidney disease) stage 4, GFR 15-29 ml/min (HCC)   Well 74 y.o. male. Counseled on diet and exercise. Other orders as above. OK w BP given age and he is in pain. Stretches/exercises for LBP. CKD4, has appt w nephro coming up. Depomedrol today, pred tomorrow. Flexeril, warnings about sedation verbalized and written down.  Follow up in 4 weeks to reck back pain.  The patient voiced understanding and agreement to the plan.  [place], DO 08/24/19 9:26 AM

## 2019-08-25 LAB — HEPATITIS C ANTIBODY
Hepatitis C Ab: NONREACTIVE
SIGNAL TO CUT-OFF: 0.02 (ref <1.00)

## 2019-08-27 ENCOUNTER — Other Ambulatory Visit (INDEPENDENT_AMBULATORY_CARE_PROVIDER_SITE_OTHER): Payer: 59

## 2019-08-27 DIAGNOSIS — R739 Hyperglycemia, unspecified: Secondary | ICD-10-CM

## 2019-08-27 LAB — HEMOGLOBIN A1C: Hgb A1c MFr Bld: 5.7 % (ref 4.6–6.5)

## 2019-08-27 NOTE — Addendum Note (Signed)
Addended by: Trenda Moots on: 07/05/8675 09:41 AM   Modules accepted: Orders

## 2019-09-21 ENCOUNTER — Ambulatory Visit: Payer: 59 | Admitting: Family Medicine

## 2019-09-28 ENCOUNTER — Ambulatory Visit: Payer: 59 | Admitting: Family Medicine

## 2019-10-06 ENCOUNTER — Other Ambulatory Visit: Payer: Self-pay

## 2019-10-06 ENCOUNTER — Encounter: Payer: Self-pay | Admitting: Family Medicine

## 2019-10-06 ENCOUNTER — Ambulatory Visit (INDEPENDENT_AMBULATORY_CARE_PROVIDER_SITE_OTHER): Payer: 59 | Admitting: Family Medicine

## 2019-10-06 VITALS — BP 144/64 | HR 75 | Temp 96.8°F | Ht 67.0 in | Wt 155.0 lb

## 2019-10-06 DIAGNOSIS — G8929 Other chronic pain: Secondary | ICD-10-CM | POA: Insufficient documentation

## 2019-10-06 DIAGNOSIS — J302 Other seasonal allergic rhinitis: Secondary | ICD-10-CM | POA: Insufficient documentation

## 2019-10-06 DIAGNOSIS — M545 Low back pain, unspecified: Secondary | ICD-10-CM | POA: Insufficient documentation

## 2019-10-06 MED ORDER — ALLOPURINOL 100 MG PO TABS: 100.0000 mg | ORAL_TABLET | Freq: Every day | ORAL | 2 refills | Status: DC

## 2019-10-06 MED ORDER — FLUTICASONE PROPIONATE 50 MCG/ACT NA SUSP: 2.0000 | Freq: Every day | NASAL | 6 refills | Status: DC

## 2019-10-06 MED ORDER — LEVOCETIRIZINE DIHYDROCHLORIDE 5 MG PO TABS: 5.0000 mg | ORAL_TABLET | Freq: Every evening | ORAL | 2 refills | Status: DC

## 2019-10-06 NOTE — Progress Notes (Signed)
Chief Complaint  Patient presents with  . Follow-up  . Allergies    eyes itching    Subjective: Patient is a 74 y.o. male here for f/u back pain.  Pt was given meds and stretches/exercises. Pain better overall, though might be creeping back in. No recent inj or change in activity. Content w where things are at this time.  +sneezing, itchy eyes, scratchy throat, sometimes coughing from PND, runny/stuff nose. No fevers, sob, ear pain/drainage, ST, wheezing, myalgias. Has taken INCS before, taking nothing currently.   Past Medical History:  Diagnosis Date  . Coronary artery disease   . GERD (gastroesophageal reflux disease)   . Hyperlipidemia   . Hypertension     Objective: BP (!) 144/64 (BP Location: Left Arm, Patient Position: Sitting, Cuff Size: Normal)   Pulse 75   Temp (!) 96.8 F (36 C) (Oral)   Ht 5\' 7"  (1.702 m)   Wt 155 lb (70.3 kg)   SpO2 97%   BMI 24.28 kg/m  General: Awake, appears stated age HEENT: MMM, EOMi, ears neg, nares patent w/o dc Heart: RRR\ MSK: +ttp over prox glute Lungs: CTAB, no rales, wheezes or rhonchi. No accessory muscle use Psych: Age appropriate judgment and insight, normal affect and mood  Assessment and Plan: Seasonal allergies - Plan: levocetirizine (XYZAL) 5 MG tablet, fluticasone (FLONASE) 50 MCG/ACT nasal spray  Chronic bilateral low back pain without sciatica  Reorder INCS. Xyzal prn. May only need it during pollen season.  Cont care, will send message if he wishes to be set up with PT.  F/u in 5 mo for med ck or prn.   The patient voiced understanding and agreement to the plan.  [place], DO 10/06/19  9:11 AM

## 2019-10-06 NOTE — Patient Instructions (Signed)
Claritin (loratadine), Allegra (fexofenadine), Zyrtec (cetirizine) which is also equivalent to Xyzal (levocetirizine); these are listed in order from weakest to strongest. Generic, and therefore cheaper, options are in the parentheses.   Flonase (fluticasone); nasal spray that is over the counter. 2 sprays each nostril, once daily. Aim towards the same side eye when you spray.  There are available OTC, and the generic versions, which may be cheaper, are in parentheses. Show this to a pharmacist if you have trouble finding any of these items.   If your back worsens, send me a message and we can set you up with physical therapy.  Continue heat, stretches/exercises, Tylenol as needed.  Let us know if you need anything.

## 2019-10-15 ENCOUNTER — Other Ambulatory Visit: Payer: Self-pay | Admitting: Family Medicine

## 2019-10-15 DIAGNOSIS — I10 Essential (primary) hypertension: Secondary | ICD-10-CM

## 2019-10-15 MED ORDER — CARVEDILOL 25 MG PO TABS: 25.0000 mg | ORAL_TABLET | Freq: Two times a day (BID) | ORAL | 3 refills | Status: DC

## 2019-10-19 ENCOUNTER — Other Ambulatory Visit: Payer: Self-pay | Admitting: Family Medicine

## 2019-10-19 DIAGNOSIS — I2510 Atherosclerotic heart disease of native coronary artery without angina pectoris: Secondary | ICD-10-CM

## 2019-10-19 MED ORDER — ATORVASTATIN CALCIUM 20 MG PO TABS: 20.0000 mg | ORAL_TABLET | Freq: Every day | ORAL | 5 refills | Status: DC

## 2019-11-12 ENCOUNTER — Ambulatory Visit (INDEPENDENT_AMBULATORY_CARE_PROVIDER_SITE_OTHER): Payer: 59 | Admitting: Family Medicine

## 2019-11-12 ENCOUNTER — Other Ambulatory Visit: Payer: Self-pay

## 2019-11-12 ENCOUNTER — Encounter: Payer: Self-pay | Admitting: Family Medicine

## 2019-11-12 VITALS — BP 138/60 | HR 75 | Temp 95.7°F | Ht 67.0 in | Wt 156.0 lb

## 2019-11-12 DIAGNOSIS — M10372 Gout due to renal impairment, left ankle and foot: Secondary | ICD-10-CM

## 2019-11-12 LAB — URIC ACID: Uric Acid, Serum: 7.4 mg/dL (ref 4.0–7.8)

## 2019-11-12 MED ORDER — PREDNISONE 20 MG PO TABS: 40.0000 mg | ORAL_TABLET | Freq: Every day | ORAL | 0 refills | Status: AC

## 2019-11-12 NOTE — Patient Instructions (Signed)
Give [place] 2-3 business days to get the results of your labs back.   Ice/cold pack over area for 10-15 min twice daily.  OK to take Tylenol 1000 mg (2 extra strength tabs) or 975 mg (3 regular strength tabs) every 6 hours as needed.  Foods to AVOID: Red meat, organ meat (liver), lunch meat, seafood (mussels, scallops, anchovies, etc) Alcohol Sugary foods/beverages (diet soft drinks have no link to flares)  Foods to migrate to: Dairy Vegetables Cherries have limited data to suggest they help lower uric acid levels (and prevent flares) Vit C (500 mg daily) may have a modest effect with preventing flares Poultry If you are going to eat red meat, beef and pork may give you less problems than lamb.  Let us know if you need anything.

## 2019-11-12 NOTE — Progress Notes (Signed)
Musculoskeletal Exam  Patient: Joshua Thompson DOB: 1946/03/20  DOS: 11/12/2019  SUBJECTIVE:  Chief Complaint:   Chief Complaint  Patient presents with  . Gout    Joshua Thompson is a 74 y.o.  male for evaluation and treatment of L foot pain.   Onset:  1 week ago. No inj or change in activity.  Location: L great toe (MTP) pain Character:  aching, sharp and shooting  Progression of issue:  has improved Associated symptoms: swelling Treatment: to date has been rest.   Neurovascular symptoms: no +Hx of gout  Past Medical History:  Diagnosis Date  . Coronary artery disease   . GERD (gastroesophageal reflux disease)   . Hyperlipidemia   . Hypertension     Objective: VITAL SIGNS: BP 138/60 (BP Location: Left Arm, Patient Position: Sitting, Cuff Size: Normal)   Pulse 75   Temp (!) 95.7 F (35.4 C) (Temporal)   Ht 5\' 7"  (1.702 m)   Wt 156 lb (70.8 kg)   SpO2 98%   BMI 24.43 kg/m  Constitutional: Well formed, well developed. No acute distress. Cardiovascular: Brisk cap refill Thorax & Lungs: No accessory muscle use Musculoskeletal: L foot.   Tenderness to palpation: Yes over 1st MTP Deformity: no Ecchymosis: no Edema noted b/l (chronic) +mild warmth compared to surrounding tissues No erythema, fluctuance or crepitus Neurologic: Normal sensory function. Psychiatric: Normal mood. Age appropriate judgment and insight. Alert & oriented x 3.    Assessment:  Acute gout due to renal impairment involving toe of left foot - Plan: Uric acid, predniSONE (DELTASONE) 20 MG tablet  Plan: Pred burst. Ck urate, might need to adjust allopurinol 100 mg/d dosage.  F/u pending above. The patient voiced understanding and agreement to the plan.   [place], DO 11/12/19  9:13 AM

## 2019-11-22 ENCOUNTER — Telehealth: Payer: Self-pay | Admitting: Family Medicine

## 2019-11-22 NOTE — Telephone Encounter (Signed)
If the pain was not resolved with the prednisone, I question the diagnosis. If he is not improved in the next couple days, I would like to send him to the podiatrist. Ty.

## 2019-11-22 NOTE — Telephone Encounter (Signed)
Called informed of response. Foot pain is better. But still hurting some

## 2019-11-22 NOTE — Telephone Encounter (Signed)
Caller: Jeff Call back phone number: [phone]  Patient states his leaking urine since he started taking predniSONE . His last dose was 11/21/19.  Please advise.

## 2019-11-22 NOTE — Telephone Encounter (Signed)
Let's stop taking. Let me know if we are still having foot pain. Ty.

## 2019-11-22 NOTE — Telephone Encounter (Signed)
Called informed the patient of PCP instructions. He did verbalize understanding.

## 2019-12-13 ENCOUNTER — Encounter: Payer: Self-pay | Admitting: Family Medicine

## 2019-12-13 ENCOUNTER — Telehealth: Payer: Self-pay | Admitting: Family Medicine

## 2019-12-13 ENCOUNTER — Other Ambulatory Visit: Payer: Self-pay

## 2019-12-13 ENCOUNTER — Ambulatory Visit (INDEPENDENT_AMBULATORY_CARE_PROVIDER_SITE_OTHER): Payer: 59 | Admitting: Family Medicine

## 2019-12-13 VITALS — BP 152/72 | HR 78 | Temp 97.0°F | Ht 67.0 in | Wt 158.4 lb

## 2019-12-13 DIAGNOSIS — M542 Cervicalgia: Secondary | ICD-10-CM | POA: Diagnosis not present

## 2019-12-13 DIAGNOSIS — I10 Essential (primary) hypertension: Secondary | ICD-10-CM

## 2019-12-13 DIAGNOSIS — G5702 Lesion of sciatic nerve, left lower limb: Secondary | ICD-10-CM | POA: Diagnosis not present

## 2019-12-13 DIAGNOSIS — N184 Chronic kidney disease, stage 4 (severe): Secondary | ICD-10-CM | POA: Diagnosis not present

## 2019-12-13 MED ORDER — AMLODIPINE BESYLATE 5 MG PO TABS: 5.0000 mg | ORAL_TABLET | Freq: Two times a day (BID) | ORAL | 3 refills | Status: DC

## 2019-12-13 MED ORDER — HYDRALAZINE HCL 50 MG PO TABS: 100.0000 mg | ORAL_TABLET | Freq: Three times a day (TID) | ORAL | 5 refills | Status: DC

## 2019-12-13 MED ORDER — LISINOPRIL 5 MG PO TABS: 5.0000 mg | ORAL_TABLET | Freq: Every day | ORAL | 3 refills | Status: DC

## 2019-12-13 NOTE — Telephone Encounter (Signed)
Sent in

## 2019-12-13 NOTE — Patient Instructions (Signed)
Heat (pad or rice pillow in microwave) over affected area, 10-15 minutes twice daily.   OK to take Tylenol 1000 mg (2 extra strength tabs) or 975 mg (3 regular strength tabs) every 6 hours as needed.  Keep the diet clean and stay active.  Keep checking blood pressure at home  Stay on your blood pressure medication, we are adding another one. Recheck labs in 1 week.  EXERCISES RANGE OF MOTION (ROM) AND STRETCHING EXERCISES  These exercises may help you when beginning to rehabilitate your issue. In order to successfully resolve your symptoms, you must improve your posture. These exercises are designed to help reduce the forward-head and rounded-shoulder posture which contributes to this condition. Your symptoms may resolve with or without further involvement from your physician, physical therapist or athletic trainer. While completing these exercises, remember:   Restoring tissue flexibility helps normal motion to return to the joints. This allows healthier, less painful movement and activity.  An effective stretch should be held for at least 20 seconds, although you may need to begin with shorter hold times for comfort.  A stretch should never be painful. You should only feel a gentle lengthening or release in the stretched tissue.  Do not do any stretch or exercise that you cannot tolerate.  STRETCH- Axial Extensors  Lie on your back on the floor. You may bend your knees for comfort. Place a rolled-up hand towel or dish towel, about 2 inches in diameter, under the part of your head that makes contact with the floor.  Gently tuck your chin, as if trying to make a "double chin," until you feel a gentle stretch at the base of your head.  Hold 15-20 seconds. Repeat 2-3 times. Complete this exercise 1 time per day.   STRETCH - Axial Extension   Stand or sit on a firm surface. Assume a good posture: chest up, shoulders drawn back, abdominal muscles slightly tense, knees unlocked (if  standing) and feet hip width apart.  Slowly retract your chin so your head slides back and your chin slightly lowers. Continue to look straight ahead.  You should feel a gentle stretch in the back of your head. Be certain not to feel an aggressive stretch since this can cause headaches later.  Hold for 15-20 seconds. Repeat 2-3 times. Complete this exercise 1 time per day.  STRETCH - Cervical Side Bend   Stand or sit on a firm surface. Assume a good posture: chest up, shoulders drawn back, abdominal muscles slightly tense, knees unlocked (if standing) and feet hip width apart.  Without letting your nose or shoulders move, slowly tip your right / left ear to your shoulder until your feel a gentle stretch in the muscles on the opposite side of your neck.  Hold 15-20 seconds. Repeat 2-3 times. Complete this exercise 1-2 times per day.  STRETCH - Cervical Rotators   Stand or sit on a firm surface. Assume a good posture: chest up, shoulders drawn back, abdominal muscles slightly tense, knees unlocked (if standing) and feet hip width apart.  Keeping your eyes level with the ground, slowly turn your head until you feel a gentle stretch along the back and opposite side of your neck.  Hold 15-20 seconds. Repeat 2-3 times. Complete this exercise 1-2 times per day.  RANGE OF MOTION - Neck Circles   Stand or sit on a firm surface. Assume a good posture: chest up, shoulders drawn back, abdominal muscles slightly tense, knees unlocked (if standing) and feet hip  width apart.  Gently roll your head down and around from the back of one shoulder to the back of the other. The motion should never be forced or painful.  Repeat the motion 10-20 times, or until you feel the neck muscles relax and loosen. Repeat 2-3 times. Complete the exercise 1-2 times per day. STRENGTHENING EXERCISES - Cervical Strain and Sprain These exercises may help you when beginning to rehabilitate your injury. They may resolve  your symptoms with or without further involvement from your physician, physical therapist, or athletic trainer. While completing these exercises, remember:   Muscles can gain both the endurance and the strength needed for everyday activities through controlled exercises.  Complete these exercises as instructed by your physician, physical therapist, or athletic trainer. Progress the resistance and repetitions only as guided.  You may experience muscle soreness or fatigue, but the pain or discomfort you are trying to eliminate should never worsen during these exercises. If this pain does worsen, stop and make certain you are following the directions exactly. If the pain is still present after adjustments, discontinue the exercise until you can discuss the trouble with your clinician.  STRENGTH - Cervical Flexors, Isometric  Face a wall, standing about 6 inches away. Place a small pillow, a ball about 6-8 inches in diameter, or a folded towel between your forehead and the wall.  Slightly tuck your chin and gently push your forehead into the soft object. Push only with mild to moderate intensity, building up tension gradually. Keep your jaw and forehead relaxed.  Hold 10 to 20 seconds. Keep your breathing relaxed.  Release the tension slowly. Relax your neck muscles completely before you start the next repetition. Repeat 2-3 times. Complete this exercise 1 time per day.  STRENGTH- Cervical Lateral Flexors, Isometric   Stand about 6 inches away from a wall. Place a small pillow, a ball about 6-8 inches in diameter, or a folded towel between the side of your head and the wall.  Slightly tuck your chin and gently tilt your head into the soft object. Push only with mild to moderate intensity, building up tension gradually. Keep your jaw and forehead relaxed.  Hold 10 to 20 seconds. Keep your breathing relaxed.  Release the tension slowly. Relax your neck muscles completely before you start the  next repetition. Repeat 2-3 times. Complete this exercise 1 time per day.  STRENGTH - Cervical Extensors, Isometric   Stand about 6 inches away from a wall. Place a small pillow, a ball about 6-8 inches in diameter, or a folded towel between the back of your head and the wall.  Slightly tuck your chin and gently tilt your head back into the soft object. Push only with mild to moderate intensity, building up tension gradually. Keep your jaw and forehead relaxed.  Hold 10 to 20 seconds. Keep your breathing relaxed.  Release the tension slowly. Relax your neck muscles completely before you start the next repetition. Repeat 2-3 times. Complete this exercise 1 time per day.  POSTURE AND BODY MECHANICS CONSIDERATIONS Keeping correct posture when sitting, standing or completing your activities will reduce the stress put on different body tissues, allowing injured tissues a chance to heal and limiting painful experiences. The following are general guidelines for improved posture. Your physician or physical therapist will provide you with any instructions specific to your needs. While reading these guidelines, remember:  The exercises prescribed by your provider will help you have the flexibility and strength to maintain correct postures.  The correct posture provides the optimal environment for your joints to work. All of your joints have less wear and tear when properly supported by a spine with good posture. This means you will experience a healthier, less painful body.  Correct posture must be practiced with all of your activities, especially prolonged sitting and standing. Correct posture is as important when doing repetitive low-stress activities (typing) as it is when doing a single heavy-load activity (lifting).  PROLONGED STANDING WHILE SLIGHTLY LEANING FORWARD When completing a task that requires you to lean forward while standing in one place for a long time, place either foot up on a  stationary 2- to 4-inch high object to help maintain the best posture. When both feet are on the ground, the low back tends to lose its slight inward curve. If this curve flattens (or becomes too large), then the back and your other joints will experience too much stress, fatigue more quickly, and can cause pain.   RESTING POSITIONS Consider which positions are most painful for you when choosing a resting position. If you have pain with flexion-based activities (sitting, bending, stooping, squatting), choose a position that allows you to rest in a less flexed posture. You would want to avoid curling into a fetal position on your side. If your pain worsens with extension-based activities (prolonged standing, working overhead), avoid resting in an extended position such as sleeping on your stomach. Most people will find more comfort when they rest with their spine in a more neutral position, neither too rounded nor too arched. Lying on a non-sagging bed on your side with a pillow between your knees, or on your back with a pillow under your knees will often provide some relief. Keep in mind, being in any one position for a prolonged period of time, no matter how correct your posture, can still lead to stiffness.  WALKING Walk with an upright posture. Your ears, shoulders, and hips should all line up. OFFICE WORK When working at a desk, create an environment that supports good, upright posture. Without extra support, muscles fatigue and lead to excessive strain on joints and other tissues.  CHAIR:  A chair should be able to slide under your desk when your back makes contact with the back of the chair. This allows you to work closely.  The chair's height should allow your eyes to be level with the upper part of your monitor and your hands to be slightly lower than your elbows.  Body position: ? Your feet should make contact with the floor. If this is not possible, use a foot rest. ? Keep your ears over  your shoulders. This will reduce stress on your neck and low back.   Piriformis Syndrome Rehab It is normal to feel mild stretching, pulling, tightness, or discomfort as you do these exercises, but you should stop right away if you feel sudden pain or your pain gets worse.  Stretching and range of motion exercises These exercises warm up your muscles and joints and improve the movement and flexibility of your hip and pelvis. These exercises also help to relieve pain, numbness, and tingling. Exercise A: Hip rotators   1. Lie on your back on a firm surface. 2. Pull your left / right knee toward your same shoulder with your left / right hand until your knee is pointing toward the ceiling. Hold your left / right ankle with your other hand. 3. Keeping your knee steady, gently pull your left / right ankle toward your  other shoulder until you feel a stretch in your buttocks. 4. Hold this position for 30 seconds. Repeat 2 times. Complete this stretch 3 times per week. Exercise B: Hip extensors 1. Lie on your back on a firm surface. Both of your legs should be straight. 2. Pull your left / right knee to your chest. Hold your leg in this position by holding onto the back of your thigh or the front of your knee. 3. Hold this position for 30 seconds. 4. Slowly return to the starting position. Repeat 2 times. Complete this stretch 3 times per week.  Strengthening exercises These exercises build strength and endurance in your hip and thigh muscles. Endurance is the ability to use your muscles for a long time, even after they get tired. Exercise C: Straight leg raises (hip abductors)    1. Lie on your side with your left / right leg in the top position. Lie so your head, shoulder, knee, and hip line up. Bend your bottom knee to help you balance. 2. Lift your top leg up 4-6 inches (10-15 cm), keeping your toes pointed straight ahead. 3. Hold this position for 1 second. 4. Slowly lower your leg to  the starting position. Let your muscles relax completely. Repeat for a total of 10 repetitions. Repeat 2 times. Complete this exercise 3 times per week. Exercise D: Hip abductors and rotators, quadruped   1. Get on your hands and knees on a firm, lightly padded surface. Your hands should be directly below your shoulders, and your knees should be directly below your hips. 2. Lift your left / right knee out to the side. Keep your knee bent. Do not twist your body. 3. Hold this position for 1 seconds. 4. Slowly lower your leg. Repeat for a total of 10 repetitions.  Repeat 1 times. Complete this exercise 3 times per week. Exercise E: Straight leg raises (hip extensors) 1. Lie on your abdomen on a bed or a firm surface with a pillow under your hips. 2. Squeeze your buttock muscles and lift your left / right thigh off the bed. Do not let your back arch. 3. Hold this position for 3 seconds. 4. Slowly return to the starting position. Let your muscles relax completely before doing another repetition. Repeat 2 times. Complete this exercise 3 times per week.  This information is not intended to replace advice given to you by your health care provider. Make sure you discuss any questions you have with your health care provider. Document Released: 06/03/2005 Document Revised: 02/06/2016 Document Reviewed: 05/16/2015 Elsevier Interactive Patient Education  [employer].

## 2019-12-13 NOTE — Progress Notes (Signed)
Chief Complaint  Patient presents with  . Follow-up    Subjective Joshua Thompson is a 74 y.o. male who presents for hypertension follow up. He does monitor home blood pressures. Blood pressures ranging from 140-160's/80-90's on average. He is compliant with medications- Coreg 25 mg bid, Norvasc 5 mg/d, hydralazine 100 mg TID. Patient has these side effects of medication: none He is adhering to a healthy diet overall. Current exercise: walking, stretching, body wt exercises  +chronic low back pain. He is doing stretches/exercises for lumbar pain and is noticing improvement. He will have intermittent numbness/tingling in his LLE without weakness. Usually positional. No bowel/bladder incontinence.  +R sided neck pain without neurologic s/s's.   Past Medical History:  Diagnosis Date  . Coronary artery disease   . GERD (gastroesophageal reflux disease)   . Hyperlipidemia   . Hypertension     Exam BP (!) 152/72 (BP Location: Left Arm, Patient Position: Sitting, Cuff Size: Normal)   Pulse 78   Temp (!) 97 F (36.1 C) (Temporal)   Ht 5\' 7"  (1.702 m)   Wt 158 lb 6 oz (71.8 kg)   SpO2 99%   BMI 24.81 kg/m  General:  well developed, well nourished, in no apparent distress Heart: RRR, no bruits, no LE edema Lungs: clear to auscultation, no accessory muscle use Psych: well oriented with normal range of affect and appropriate judgment/insight  Essential hypertension, benign - Plan: hydrALAZINE (APRESOLINE) 50 MG tablet, lisinopril (ZESTRIL) 5 MG tablet, Basic metabolic panel  CKD (chronic kidney disease) stage 4, GFR 15-29 ml/min (HCC) - Plan: Basic metabolic panel  Piriformis syndrome of left side  Neck pain  1- Cont meds. Add low dose of lisinopril given #2. Counseled on diet and exercise. 2- Monitor labs in 1 week. 3- Stretches/exercises. Without weakness, will rehab. If no better in 1 mo, will refer to PT. 4- Heat, ice, Tylenol, stretches/exercises. F/u in 1 mo. The  patient voiced understanding and agreement to the plan.  [place], DO 12/13/19  8:20 AM

## 2019-12-13 NOTE — Telephone Encounter (Signed)
Medication: amLODipine (NORVASC) 5 MG tablet   Has the patient contacted their pharmacy? No. (If no, request that the patient contact the pharmacy for the refill.) (If yes, when and what did the pharmacy advise?)  Preferred Pharmacy (with phone number or street name):  [facility] [id number] - [place], [address] Phone:  [phone]  Fax:  [phone]       Agent: Please be advised that RX refills may take up to 3 business days. We ask that you follow-up with your pharmacy.

## 2019-12-15 ENCOUNTER — Other Ambulatory Visit: Payer: Self-pay | Admitting: Family Medicine

## 2019-12-15 DIAGNOSIS — I10 Essential (primary) hypertension: Secondary | ICD-10-CM

## 2019-12-24 ENCOUNTER — Other Ambulatory Visit: Payer: Self-pay

## 2019-12-24 ENCOUNTER — Other Ambulatory Visit (INDEPENDENT_AMBULATORY_CARE_PROVIDER_SITE_OTHER): Payer: 59

## 2019-12-24 DIAGNOSIS — N184 Chronic kidney disease, stage 4 (severe): Secondary | ICD-10-CM

## 2019-12-24 DIAGNOSIS — I10 Essential (primary) hypertension: Secondary | ICD-10-CM | POA: Diagnosis not present

## 2019-12-24 LAB — BASIC METABOLIC PANEL
BUN: 41 mg/dL — ABNORMAL HIGH (ref 6–23)
CO2: 27 mEq/L (ref 19–32)
Calcium: 9.3 mg/dL (ref 8.4–10.5)
Chloride: 106 mEq/L (ref 96–112)
Creatinine, Ser: 3.14 mg/dL — ABNORMAL HIGH (ref 0.40–1.50)
GFR: 19.59 mL/min — ABNORMAL LOW (ref >60.00)
Glucose, Bld: 98 mg/dL (ref 70–99)
Potassium: 4.9 mEq/L (ref 3.5–5.1)
Sodium: 141 mEq/L (ref 135–145)

## 2020-01-04 ENCOUNTER — Other Ambulatory Visit: Payer: Self-pay | Admitting: Family Medicine

## 2020-01-04 DIAGNOSIS — J302 Other seasonal allergic rhinitis: Secondary | ICD-10-CM

## 2020-01-12 ENCOUNTER — Ambulatory Visit (INDEPENDENT_AMBULATORY_CARE_PROVIDER_SITE_OTHER): Payer: 59 | Admitting: Family Medicine

## 2020-01-12 ENCOUNTER — Encounter: Payer: Self-pay | Admitting: Family Medicine

## 2020-01-12 ENCOUNTER — Other Ambulatory Visit: Payer: Self-pay

## 2020-01-12 VITALS — BP 140/62 | HR 69 | Temp 98.0°F | Ht 67.0 in | Wt 154.0 lb

## 2020-01-12 DIAGNOSIS — I10 Essential (primary) hypertension: Secondary | ICD-10-CM | POA: Diagnosis not present

## 2020-01-12 DIAGNOSIS — G5702 Lesion of sciatic nerve, left lower limb: Secondary | ICD-10-CM | POA: Diagnosis not present

## 2020-01-12 MED ORDER — LISINOPRIL 10 MG PO TABS
10.0000 mg | ORAL_TABLET | Freq: Every day | ORAL | 3 refills | Status: DC
Start: 2020-01-12 — End: 2020-05-02

## 2020-01-12 MED ORDER — AMLODIPINE BESYLATE 5 MG PO TABS: 5.0000 mg | ORAL_TABLET | Freq: Two times a day (BID) | ORAL | 3 refills | Status: DC

## 2020-01-12 MED ORDER — CARVEDILOL 25 MG PO TABS: 25.0000 mg | ORAL_TABLET | Freq: Two times a day (BID) | ORAL | 3 refills | Status: DC

## 2020-01-12 MED ORDER — HYDRALAZINE HCL 50 MG PO TABS: 100.0000 mg | ORAL_TABLET | Freq: Three times a day (TID) | ORAL | 2 refills | Status: DC

## 2020-01-12 NOTE — Progress Notes (Signed)
Chief Complaint  Patient presents with   Follow-up    Subjective Joshua Thompson is a 74 y.o. male who presents for hypertension follow up. He does monitor home blood pressures. Blood pressures ranging from 140-190's/80-90's on average. He is compliant with medications- Norvasc 5 mg/d, Hydralazine 100 mg TID, lisinopril 5 mg/d, Coreg 25 mg bid. Patient has these side effects of medication: none He is adhering to a healthy diet overall. Current exercise: walking, stretching, wt resistance exercise  He was started on a HEP for LBP/piriformis syndrome. Since that time, he reports only slight improvement.     Past Medical History:  Diagnosis Date   Coronary artery disease    GERD (gastroesophageal reflux disease)    Hyperlipidemia    Hypertension    Exam BP (!) 140/62 (BP Location: Left Arm, Patient Position: Sitting, Cuff Size: Normal)    Pulse 69    Temp 98 F (36.7 C) (Oral)    Ht 5\' 7"  (1.702 m)    Wt 154 lb (69.9 kg)    SpO2 99%    BMI 24.12 kg/m  General:  well developed, well nourished, in no apparent distress Heart: RRR, no bruits, no LE edema Lungs: clear to auscultation, no accessory muscle use MSK: +TTP over the L piriformis/distal glute med Psych: well oriented with normal range of affect and appropriate judgment/insight  Essential hypertension - Plan: hydrALAZINE (APRESOLINE) 50 MG tablet, carvedilol (COREG) 25 MG tablet, amLODipine (NORVASC) 5 MG tablet, Basic metabolic panel  Piriformis syndrome of left side - Plan: Ambulatory referral to Physical Therapy  1. Cont meds, increase dosage of lisinopril to 10 mg/d. Counseled on diet and exercise. 2. PT F/u in 1 mo. The patient voiced understanding and agreement to the plan.  [place], DO 01/12/20  8:11 AM

## 2020-01-12 NOTE — Patient Instructions (Addendum)
Keep the diet clean and stay active.  If you do not hear anything about your referral in the next 1-2 weeks, call our office and ask for an update.  Continue checking your blood pressure at home. Bring your monitor to your next appointment.   Let us know if you need anything.

## 2020-01-20 ENCOUNTER — Other Ambulatory Visit: Payer: Self-pay | Admitting: Urology

## 2020-01-20 DIAGNOSIS — I701 Atherosclerosis of renal artery: Secondary | ICD-10-CM

## 2020-01-24 NOTE — Telephone Encounter (Signed)
Attempted to contact patient for more information but there was no answer and VM did not pick up.

## 2020-01-26 ENCOUNTER — Other Ambulatory Visit: Payer: Self-pay

## 2020-01-26 ENCOUNTER — Ambulatory Visit: Payer: 59 | Attending: Family Medicine | Admitting: Physical Therapy

## 2020-01-26 ENCOUNTER — Encounter: Payer: Self-pay | Admitting: Physical Therapy

## 2020-01-26 ENCOUNTER — Other Ambulatory Visit: Payer: Self-pay | Admitting: Urology

## 2020-01-26 DIAGNOSIS — M5442 Lumbago with sciatica, left side: Secondary | ICD-10-CM | POA: Insufficient documentation

## 2020-01-26 DIAGNOSIS — M25562 Pain in left knee: Secondary | ICD-10-CM | POA: Insufficient documentation

## 2020-01-26 DIAGNOSIS — G8929 Other chronic pain: Secondary | ICD-10-CM | POA: Diagnosis present

## 2020-01-26 DIAGNOSIS — M542 Cervicalgia: Secondary | ICD-10-CM | POA: Insufficient documentation

## 2020-01-26 DIAGNOSIS — R262 Difficulty in walking, not elsewhere classified: Secondary | ICD-10-CM | POA: Diagnosis present

## 2020-01-26 DIAGNOSIS — R293 Abnormal posture: Secondary | ICD-10-CM | POA: Diagnosis present

## 2020-01-26 NOTE — Telephone Encounter (Signed)
Attempted phone call to pt.  No answer and no voicemail. 

## 2020-01-26 NOTE — Telephone Encounter (Signed)
Spoke with patient.

## 2020-01-26 NOTE — Telephone Encounter (Signed)
Follow up  ° ° °Pt returning call  °

## 2020-01-26 NOTE — Telephone Encounter (Addendum)
Called and spoke to patient. I have schedule both his wife and him to see Dr. Irish Lack on 8/13 for increase LEE and HTN. They have been instructed to contact us if their Sx worsen.

## 2020-01-26 NOTE — Therapy (Signed)
[facility] [address]  [place], [place], [address] Phone: [phone]   Fax:  [phone]  Physical Therapy Evaluation  Patient Details  Name: Joshua Thompson MRN: [id number] Date of Birth: 1946-02-13 Referring Provider (PT): Crosby Oyster [place], [place]   Encounter Date: 01/26/2020   PT End of Session - 01/26/20 0946    Visit Number 1    Number of Visits 13    Date for PT Re-Evaluation 03/08/20    Authorization Type [employer]    PT Start Time 0802    PT Stop Time 7867    PT Time Calculation (min) 47 min    Activity Tolerance Patient tolerated treatment well;Patient limited by pain    Behavior During Therapy [facility] for tasks assessed/performed           Past Medical History:  Diagnosis Date   Coronary artery disease    GERD (gastroesophageal reflux disease)    Hyperlipidemia    Hypertension     Past Surgical History:  Procedure Laterality Date   CORONARY ANGIOPLASTY     3 STENTS   TRANSURETHRAL RESECTION OF PROSTATE     HAD DONE THREE TIMES    There were no vitals filed for this visit.    Subjective Assessment - 01/26/20 0805    Subjective Patient reports that he has been having back pain for decades. D/t this pain, his L leg has been bothering him with walking and standing as it becomes numb. L medial knee has been hurting for the past 2 years- worse with cold weather and stairs. Also noting swelling in B feet for the past year but worsening for the past 3 months. Also worse by the end of the day.  For the past month he has had pain in the R side of the neck- worse when rotating to R. Denies N/T in UEs or changes in B&B.    Pertinent History HTN, HLD, GERD, CAD    Limitations Sitting;Lifting;Standing;Walking;House hold activities    How long can you sit comfortably? unlimited    How long can you stand comfortably? variable    How long can you walk comfortably? variable    Diagnostic  tests none recent    Patient Stated Goals decrease pain    Currently in Pain? Yes    Pain Score 0-No pain    Pain Location Back    Pain Orientation Right;Left;Lower    Pain Descriptors / Indicators Aching    Pain Type Chronic pain    Multiple Pain Sites Yes    Pain Score 0    Pain Location Knee    Pain Orientation Left;Medial    Pain Descriptors / Indicators Throbbing;Sharp    Pain Type Chronic pain    Pain Score 0    Pain Location Neck    Pain Orientation Right    Pain Descriptors / Indicators Sharp    Pain Type Acute pain              [facility] PT Assessment - 01/26/20 0813      Assessment   Medical Diagnosis Piriformis of L side    Referring Provider (PT) Shelda Pal, DO    Onset Date/Surgical Date --   chronic and acute conditions   Hand Dominance Right    Prior Therapy no      Precautions   Precautions None      Balance Screen   Has the patient fallen in the past 6  months No    Has the patient had a decrease in activity level because of a fear of falling?  No    Is the patient reluctant to leave their home because of a fear of falling?  No      Home Ecologist residence    Living Arrangements Spouse/significant other    Available Help at Discharge Family;Friend(s)    Type of [place] Access Level entry    Mobile Two level    Alternate Level Stairs-Number of Steps 19    Alternate Level Stairs-Rails Right    Home Equipment None      Prior Function   Level of [place] Retired    Leisure Pension scheme manager   Overall Cognitive Status Within Functional Limits for tasks assessed      Observation/Other Assessments-Edema    Edema --   B lower leg and pedal edema L>R without warmth or redness     Sensation   Light Touch Impaired by gross assessment   c/o decreased sensation over L lower leg     Coordination   Gross Motor Movements are Fluid and Coordinated Yes       Posture/Postural Control   Posture/Postural Control Postural limitations    Postural Limitations Rounded Shoulders;Forward head;Increased thoracic kyphosis      ROM / Strength   AROM / PROM / Strength AROM;Strength      AROM   AROM Assessment Site Lumbar    Lumbar Flexion toes   L knee and LBP   Lumbar Extension mildly limited    Lumbar - Right Side Bend distal thigh   L LBP   Lumbar - Left Side Bend distal thigh    Lumbar - Right Rotation WNL   neck pain   Lumbar - Left Rotation WNL      Strength   Overall Strength Comments in sitting    Strength Assessment Site Hip;Knee;Ankle    Right/Left Hip Right;Left    Right Hip Flexion 4/5    Right Hip ABduction 4+/5    Right Hip ADduction 4+/5    Left Hip Flexion 4+/5    Left Hip ABduction 4/5    Left Hip ADduction 4+/5    Right/Left Knee Right;Left    Right Knee Flexion 4/5    Right Knee Extension 4+/5    Left Knee Flexion 4/5    Left Knee Extension 4+/5    Right/Left Ankle Right;Left    Right Ankle Dorsiflexion 4+/5    Right Ankle Plantar Flexion 4+/5    Left Ankle Dorsiflexion 4/5    Left Ankle Plantar Flexion 4/5      Flexibility   Soft Tissue Assessment /Muscle Length yes    Hamstrings B mildly tight, pain with R stretch    Quadriceps Unable to tolerate on R mod thomas, WNL L    Piriformis WNL B KTOS and fig 4, mild pain in LB      Palpation   Palpation comment TTP over L pes anserine, TTP along central lumbar spine and B PSIS,  B lumbar paraspinals, B piriformis- L>R      Ambulation/Gait   Assistive device None    Gait Pattern Step-through pattern;Decreased step length - right;Decreased step length - left;Trunk flexed    Ambulation Surface Level;Indoor    Gait velocity decreased  Objective measurements completed on examination: See above findings.               PT Education - 01/26/20 0945    Education Details prognosis, POC, HEP; advised patient to discuss possible  treatment/causes of pedal edema with PCP    Person(s) Educated Patient    Methods Explanation;Demonstration;Tactile cues;Handout;Verbal cues    Comprehension Verbalized understanding;Returned demonstration            PT Short Term Goals - 01/26/20 0950      PT SHORT TERM GOAL #1   Title Patient to be independent with initial HEP.    Time 3    Period Weeks    Status New    Target Date 02/16/20             PT Long Term Goals - 01/26/20 0950      PT LONG TERM GOAL #1   Title Patient to be independent with advanced HEP.    Time 6    Period Weeks    Status New    Target Date 03/08/20      PT LONG TERM GOAL #2   Title Patient to demonstrate cervical and lumbar AROM without pain limiting.    Time 6    Period Weeks    Status New    Target Date 03/08/20      PT LONG TERM GOAL #3   Title Patient to demonstrate B LE strength >/=4+/5.    Time 6    Period Weeks    Status New    Target Date 03/08/20      PT LONG TERM GOAL #4   Title Patient to report tolerance for weeding/yardwork for 30 minutes without pain limiting.    Time 6    Period Weeks    Status New    Target Date 03/08/20      PT LONG TERM GOAL #5   Title Patient to report ability to navigate stairs to get to his bedroom without pain.    Time 6    Period Weeks    Status New    Target Date 03/08/20                  Plan - 01/26/20 0947    Clinical Impression Statement Patient is a 74y/o M presenting to OPPT with c/o chronic LBP and L knee pain and acute neck pain of insidious onset. LBP occurs across B sides with intermittent N/T down the L LE with prolonged standing and walking. Denies B&B changes. L knee pain occurs medially and worse with cold weather and stairs. Neck pain occurs over R side and is worse when rotating to the R. Denies N/T in UEs. Patient also with concern over B pedal edema which he notes has worsened over the past 3 months. Patient today presenting with B lower leg and pedal edema-  L>R, rounded and forward head posture, limited and painful lumbar AROM, decreased LE strength, flexibility limited by pain, TTP over L pes anserine, B lumbar paraspinals, B piriformis, along midline of lumbar spine and B PSIS, and gait deviations. Patient was educated on gentle stretching and strengthening HEP- patient reported understanding. Plan for further assessment of cervical spine and L knee next session. Would benefit from skilled PT services 2x/week for 6 weeks to address aforementioned impairments.    Personal Factors and Comorbidities Age;Comorbidity 3+;Fitness;Past/Current Experience;Time since onset of injury/illness/exacerbation    Comorbidities HTN, HLD, GERD, CAD    Examination-Activity Limitations Sleep;Bed Mobility;Bend;Squat;Stairs;Carry;Stand;Transfers;Dressing;Hygiene/Grooming;Lift;Locomotion  Level;Reach Overhead    Examination-Participation Restrictions Church;Cleaning;Community Activity;Shop;Driving;Yard Work;Laundry;Meal Prep    Stability/Clinical Decision Making Stable/Uncomplicated    Clinical Decision Making Low    Rehab Potential Good    PT Frequency 2x / week    PT Duration 6 weeks    PT Treatment/Interventions ADLs/Self Care Home Management;Cryotherapy;Electrical Stimulation;Moist Heat;Therapeutic exercise;Therapeutic activities;Traction;Functional mobility training;Stair training;Gait training;Ultrasound;Neuromuscular re-education;Patient/family education;Manual techniques;Vasopneumatic Device;Taping;Energy conservation;Dry needling;Passive range of motion    PT Next Visit Plan assess cervical and L knee ROM, address UE strength, reassess HEP    Consulted and Agree with Plan of Care Patient           Patient will benefit from skilled therapeutic intervention in order to improve the following deficits and impairments:  Hypomobility, Decreased activity tolerance, Decreased strength, Pain, Increased fascial restricitons, Difficulty walking, Increased muscle spasms,  Improper body mechanics, Decreased range of motion, Postural dysfunction, Impaired flexibility  Visit Diagnosis: Chronic bilateral low back pain with left-sided sciatica  Cervicalgia  Chronic pain of left knee  Difficulty in walking, not elsewhere classified  Abnormal posture     Problem List Patient Active Problem List   Diagnosis Date Noted   Seasonal allergies 10/06/2019   Chronic bilateral low back pain without sciatica 10/06/2019   Hyperkalemia 02/12/2015   Urinary retention 02/12/2015   CKD (chronic kidney disease), stage III 02/12/2015   AKI (acute kidney injury) ([place]) 02/12/2015   Vertigo 02/12/2015   Essential hypertension 10/21/2013   Coronary atherosclerosis of native coronary artery 10/21/2013   CKD (chronic kidney disease) stage 4, GFR 15-29 ml/min (HCC) 10/21/2013     Janene Harvey, PT, DPT 01/26/20 9:54 AM   [facility] [address]  [place], [place], [address] Phone: [phone]   Fax:  [phone]  Name: Micai Apolinar MRN: [id number] Date of Birth: August 23, 1945

## 2020-01-27 ENCOUNTER — Other Ambulatory Visit: Payer: Self-pay | Admitting: Urology

## 2020-01-28 ENCOUNTER — Ambulatory Visit (INDEPENDENT_AMBULATORY_CARE_PROVIDER_SITE_OTHER): Payer: 59 | Admitting: Interventional Cardiology

## 2020-01-28 ENCOUNTER — Ambulatory Visit
Admission: RE | Admit: 2020-01-28 | Discharge: 2020-01-28 | Disposition: A | Discharge status: Home / Self Care | Payer: 59 | Source: Ambulatory Visit | Attending: Urology | Admitting: Urology

## 2020-01-28 ENCOUNTER — Other Ambulatory Visit: Payer: Self-pay

## 2020-01-28 ENCOUNTER — Encounter: Payer: Self-pay | Admitting: Interventional Cardiology

## 2020-01-28 VITALS — BP 136/60 | HR 73 | Ht 67.0 in | Wt 156.8 lb

## 2020-01-28 DIAGNOSIS — E782 Mixed hyperlipidemia: Secondary | ICD-10-CM | POA: Diagnosis not present

## 2020-01-28 DIAGNOSIS — I10 Essential (primary) hypertension: Secondary | ICD-10-CM

## 2020-01-28 DIAGNOSIS — I701 Atherosclerosis of renal artery: Secondary | ICD-10-CM

## 2020-01-28 DIAGNOSIS — I25119 Atherosclerotic heart disease of native coronary artery with unspecified angina pectoris: Secondary | ICD-10-CM | POA: Diagnosis not present

## 2020-01-28 DIAGNOSIS — N184 Chronic kidney disease, stage 4 (severe): Secondary | ICD-10-CM | POA: Diagnosis not present

## 2020-01-28 MED ORDER — AMLODIPINE BESYLATE 5 MG PO TABS: 5.0000 mg | ORAL_TABLET | Freq: Two times a day (BID) | ORAL | 3 refills | Status: DC

## 2020-01-28 NOTE — Patient Instructions (Signed)
Medication Instructions:  Your physician has recommended you make the following change in your medication:   CHANGE: amlodipine to 5 mg by mouth twice a day  *If you need a refill on your cardiac medications before your next appointment, please call your pharmacy*   Lab Work: None  If you have labs (blood work) drawn today and your tests are completely normal, you will receive your results only by: Marland Kitchen MyChart Message (if you have MyChart) OR . A paper copy in the mail If you have any lab test that is abnormal or we need to change your treatment, we will call you to review the results.   Testing/Procedures: None   Follow-Up: Keep plan for follow up in February 2022   Other Instructions Your physician has requested that you regularly monitor and record your blood pressure readings at home. Please use the same machine at the same time of day to check your readings and record them and send a MyChart message to report readings.    Low-Sodium Eating Plan Sodium, which is an element that makes up salt, helps you maintain a healthy balance of fluids in your body. Too much sodium can increase your blood pressure and cause fluid and waste to be held in your body. Your health care provider or dietitian may recommend following this plan if you have high blood pressure (hypertension), kidney disease, liver disease, or heart failure. Eating less sodium can help lower your blood pressure, reduce swelling, and protect your heart, liver, and kidneys. What are tips for following this plan? General guidelines  Most people on this plan should limit their sodium intake to 1,500-2,000 mg (milligrams) of sodium each day. Reading food labels   The Nutrition Facts label lists the amount of sodium in one serving of the food. If you eat more than one serving, you must multiply the listed amount of sodium by the number of servings.  Choose foods with less than 140 mg of sodium per serving.  Avoid foods  with 300 mg of sodium or more per serving. Shopping  Look for lower-sodium products, often labeled as "low-sodium" or "no salt added."  Always check the sodium content even if foods are labeled as "unsalted" or "no salt added".  Buy fresh foods. ? Avoid canned foods and premade or frozen meals. ? Avoid canned, cured, or processed meats  Buy breads that have less than 80 mg of sodium per slice. Cooking  Eat more home-cooked food and less restaurant, buffet, and fast food.  Avoid adding salt when cooking. Use salt-free seasonings or herbs instead of table salt or sea salt. Check with your health care provider or pharmacist before using salt substitutes.  Cook with plant-based oils, such as canola, sunflower, or olive oil. Meal planning  When eating at a restaurant, ask that your food be prepared with less salt or no salt, if possible.  Avoid foods that contain MSG (monosodium glutamate). MSG is sometimes added to [place] food, bouillon, and some canned foods. What foods are recommended? The items listed may not be a complete list. Talk with your dietitian about what dietary choices are best for you. Grains Low-sodium cereals, including oats, puffed wheat and rice, and shredded wheat. Low-sodium crackers. Unsalted rice. Unsalted pasta. Low-sodium bread. Whole-grain breads and whole-grain pasta. Vegetables Fresh or frozen vegetables. "No salt added" canned vegetables. "No salt added" tomato sauce and paste. Low-sodium or reduced-sodium tomato and vegetable juice. Fruits Fresh, frozen, or canned fruit. Fruit juice. Meats and other protein  foods Fresh or frozen (no salt added) meat, poultry, seafood, and fish. Low-sodium canned tuna and salmon. Unsalted nuts. Dried peas, beans, and lentils without added salt. Unsalted canned beans. Eggs. Unsalted nut butters. Dairy Milk. Soy milk. Cheese that is naturally low in sodium, such as ricotta cheese, fresh mozzarella, or Swiss cheese Low-sodium  or reduced-sodium cheese. Cream cheese. Yogurt. Fats and oils Unsalted butter. Unsalted margarine with no trans fat. Vegetable oils such as canola or olive oils. Seasonings and other foods Fresh and dried herbs and spices. Salt-free seasonings. Low-sodium mustard and ketchup. Sodium-free salad dressing. Sodium-free light mayonnaise. Fresh or refrigerated horseradish. Lemon juice. Vinegar. Homemade, reduced-sodium, or low-sodium soups. Unsalted popcorn and pretzels. Low-salt or salt-free chips. What foods are not recommended? The items listed may not be a complete list. Talk with your dietitian about what dietary choices are best for you. Grains Instant hot cereals. Bread stuffing, pancake, and biscuit mixes. Croutons. Seasoned rice or pasta mixes. Noodle soup cups. Boxed or frozen macaroni and cheese. Regular salted crackers. Self-rising flour. Vegetables Sauerkraut, pickled vegetables, and relishes. Olives. [place] fries. Onion rings. Regular canned vegetables (not low-sodium or reduced-sodium). Regular canned tomato sauce and paste (not low-sodium or reduced-sodium). Regular tomato and vegetable juice (not low-sodium or reduced-sodium). Frozen vegetables in sauces. Meats and other protein foods Meat or fish that is salted, canned, smoked, spiced, or pickled. Bacon, ham, sausage, hotdogs, corned beef, chipped beef, packaged lunch meats, salt pork, jerky, pickled herring, anchovies, regular canned tuna, sardines, salted nuts. Dairy Processed cheese and cheese spreads. Cheese curds. Blue cheese. Feta cheese. String cheese. Regular cottage cheese. Buttermilk. Canned milk. Fats and oils Salted butter. Regular margarine. Ghee. Bacon fat. Seasonings and other foods Onion salt, garlic salt, seasoned salt, table salt, and sea salt. Canned and packaged gravies. Worcestershire sauce. Tartar sauce. Barbecue sauce. Teriyaki sauce. Soy sauce, including reduced-sodium. Steak sauce. Fish sauce. Oyster sauce.  Cocktail sauce. Horseradish that you find on the shelf. Regular ketchup and mustard. Meat flavorings and tenderizers. Bouillon cubes. Hot sauce and Tabasco sauce. Premade or packaged marinades. Premade or packaged taco seasonings. Relishes. Regular salad dressings. Salsa. Potato and tortilla chips. Corn chips and puffs. Salted popcorn and pretzels. Canned or dried soups. Pizza. Frozen entrees and pot pies. Summary  Eating less sodium can help lower your blood pressure, reduce swelling, and protect your heart, liver, and kidneys.  Most people on this plan should limit their sodium intake to 1,500-2,000 mg (milligrams) of sodium each day.  Canned, boxed, and frozen foods are high in sodium. Restaurant foods, fast foods, and pizza are also very high in sodium. You also get sodium by adding salt to food.  Try to cook at home, eat more fresh fruits and vegetables, and eat less fast food, canned, processed, or prepared foods. This information is not intended to replace advice given to you by your health care provider. Make sure you discuss any questions you have with your health care provider. Document Revised: 05/16/2017 Document Reviewed: 05/27/2016 Elsevier Patient Education  2020 [employer].

## 2020-01-28 NOTE — Progress Notes (Signed)
Cardiology Office Note   Date:  01/28/2020   ID:  Davontay, [place] [birth year], MRN [id number]  PCP:  Shelda Pal, DO    No chief complaint on file.  CAD  Wt Readings from Last 3 Encounters:  01/28/20 156 lb 12.8 oz (71.1 kg)  01/12/20 154 lb (69.9 kg)  12/13/19 158 lb 6 oz (71.8 kg)       History of Present Illness: Joshua Thompson is a 74 y.o. male   who has had HTN for about 15 years. In 2004, he had chest pain and then a stress test which was abnormal and then a DES (Taxus) was placed, while in [place]. He did well after that time.   In 2010, he had leg pain and kidney pain. He was gaining weight and feeling ill and he was retaining fluid. His BP was high. His creatinine had reached 1.5+. He also had prostatic obstruction leading to the urinary issues.   In 09/2012, he had HTN, urine retention and then he had a catheter placed for his bladder. TURP was recommended, but it was also noted that his BP was up. Another coronary lesion was found. He was in renal failure, Cr 4.5. He then had two bare stents placed. Two weeks later, he had a TURP which was unsuccessful. He had a bladder obstruction. He had a repeat TURP.   In August 2014, he and his wife Moved to [place]. He followed up with urology at [facility]. It was noted that there was an irregularity of the surface of the inside of the bladder. His Cr. Is 3.0 at baseline.  Nebivolol has worked better than Coreg in the past but was more expensive.He now takes carvedilol BID and metoprolol for prn increased BP.Hydralazine was recommended to be taken TID, but he did not take this. Renal dysfunction has limited meds.  He had some sensation in his chest that resolved with an herbal medicinein 2018.  He had some high BP readings. He continues to be compliant with his medications.  Since the last visit, he has followed with urology and was referred to nephrology for CRF.   GFR was 22 in 2019 per his report.  Increase of Carvedilol has helped BP.   Since the last visit, he had some high BP readings. Typically in the evening.  He has had more leg swelling.  He has some leg pain with walking.  Leg swelling is a bigger issue at this time.  He has some left leg pain related to a chronic back problem as well.  No nonhealing sores on his legs.     Past Medical History:  Diagnosis Date  . Coronary artery disease   . GERD (gastroesophageal reflux disease)   . Hyperlipidemia   . Hypertension     Past Surgical History:  Procedure Laterality Date  . CORONARY ANGIOPLASTY     3 STENTS  . TRANSURETHRAL RESECTION OF PROSTATE     HAD DONE THREE TIMES     Current Outpatient Medications  Medication Sig Dispense Refill  . allopurinol (ZYLOPRIM) 100 MG tablet Take 1 tablet (100 mg total) by mouth daily. 90 tablet 2  . atorvastatin (LIPITOR) 20 MG tablet Take 1 tablet (20 mg total) by mouth daily. 30 tablet 5  . carvedilol (COREG) 25 MG tablet Take 1 tablet (25 mg total) by mouth 2 (two) times daily with a meal. 60 tablet 3  . fluticasone (FLONASE) 50 MCG/ACT nasal spray Place 2 sprays into both  nostrils daily. 16 g 6  . hydrALAZINE (APRESOLINE) 50 MG tablet Take 2 tablets (100 mg total) by mouth 3 (three) times daily. 180 tablet 2  . levocetirizine (XYZAL) 5 MG tablet TAKE 1 TABLET(5 MG) BY MOUTH EVERY EVENING 30 tablet 2  . lisinopril (ZESTRIL) 10 MG tablet Take 1 tablet (10 mg total) by mouth daily. 30 tablet 3  . metoprolol tartrate (LOPRESSOR) 25 MG tablet Take 1 tablet (25 mg total) by mouth 2 (two) times daily as needed (high blood pressure). 30 tablet 3  . nitroGLYCERIN (NITROSTAT) 0.4 MG SL tablet Place 1 tablet (0.4 mg total) under the tongue every 5 (five) minutes as needed for chest pain. 25 tablet 5  . RABEprazole (ACIPHEX) 20 MG tablet Take 1 tablet (20 mg total) by mouth daily. 90 tablet 1  . tamsulosin (FLOMAX) 0.4 MG CAPS capsule Take 0.4 mg by mouth  daily.    Marland Kitchen triamcinolone cream (KENALOG) 0.1 % Apply 1 application topically 2 (two) times daily. 30 g 0  . amLODipine (NORVASC) 10 MG tablet Take 10 mg by mouth daily.     No current facility-administered medications for this visit.    Allergies:   Amoxicillin-pot clavulanate and Ampicillin    Social History:  The patient  reports that he has never smoked. He has never used smokeless tobacco. He reports that he does not drink alcohol and does not use drugs.   Family History:  The patient's family history includes Hypertension in his mother; Stroke in his maternal grandfather.    ROS:  Please see the history of present illness.   Otherwise, review of systems are positive for leg swelling.   All other systems are reviewed and negative.    PHYSICAL EXAM: VS:  BP 136/60   Pulse 73   Ht 5\' 7"  (1.702 m)   Wt 156 lb 12.8 oz (71.1 kg)   SpO2 98%   BMI 24.56 kg/m  , BMI Body mass index is 24.56 kg/m. GEN: Well nourished, well developed, in no acute distress  HEENT: normal  Neck: no JVD, carotid bruits, or masses Cardiac: RRR; no murmurs, rubs, or gallops,no edema  Respiratory:  clear to auscultation bilaterally, normal work of breathing GI: soft, nontender, nondistended, + BS MS: no deformity or atrophy  Skin: warm and dry, no rash Neuro:  Strength and sensation are intact Psych: euthymic mood, full affect   EKG:   The ekg ordered today demonstrates NSR, no ST changes   Recent Labs: 08/24/2019: ALT 9; Hemoglobin 11.5; Platelets 207.0 12/24/2019: BUN 41; Creatinine, Ser 3.14; Potassium 4.9; Sodium 141   Lipid Panel    Component Value Date/Time   CHOL 132 08/24/2019 0922   CHOL 138 07/26/2019 0933   TRIG 71.0 08/24/2019 0922   HDL 48.10 08/24/2019 0922   HDL 55 07/26/2019 0933   CHOLHDL 3 08/24/2019 0922   VLDL 14.2 08/24/2019 0922   LDLCALC 70 08/24/2019 0922   LDLCALC 70 07/26/2019 0933     Other studies Reviewed: Additional studies/ records that were reviewed today  with results demonstrating: labs reviewed .   ASSESSMENT AND PLAN:  1. CAD: No angina.  Continue aggressive secondary prevention.  Even when BP is high, no angina.  2. HTN: Spikes in BP.  Went back to amlodipine daily.  He has used metoprolol as needed when BP elevated.  Worsening edema as well.  Not eating more salt.  Diuretic would be helpful, but limited by kidney function.  Would go back to  Amlodipine 5 mg BID. Will check with renal if we can start some diuretic to help with leg swelling.  3. CKD: Follows with Dr. Posey Pronto. Avoid nephrotoxins.   4. Hyperlipidemia: LDL 70 in 3/21.  Continue statin.  5. PAD: Moderate right subclavian disease by Doppler.  Minimal carotid disease.    Current medicines are reviewed at length with the patient today.  The patient concerns regarding his medicines were addressed.  The following changes have been made:  No change  Labs/ tests ordered today include:  No orders of the defined types were placed in this encounter.   Recommend 150 minutes/week of aerobic exercise Low fat, low carb, high fiber diet recommended  Disposition:   FU as scheduled in 6 months   Signed, Larae Grooms, MD  01/28/2020 4:02 PM    [facility] [place], [place], [place]  [address] Phone: [phone]; Fax: [phone]

## 2020-02-01 ENCOUNTER — Ambulatory Visit: Payer: 59 | Admitting: Physical Therapy

## 2020-02-01 ENCOUNTER — Other Ambulatory Visit: Payer: Self-pay

## 2020-02-01 ENCOUNTER — Telehealth: Payer: Self-pay

## 2020-02-01 ENCOUNTER — Encounter: Payer: Self-pay | Admitting: Physical Therapy

## 2020-02-01 VITALS — BP 161/60

## 2020-02-01 DIAGNOSIS — R609 Edema, unspecified: Secondary | ICD-10-CM

## 2020-02-01 DIAGNOSIS — R262 Difficulty in walking, not elsewhere classified: Secondary | ICD-10-CM

## 2020-02-01 DIAGNOSIS — M542 Cervicalgia: Secondary | ICD-10-CM

## 2020-02-01 DIAGNOSIS — G8929 Other chronic pain: Secondary | ICD-10-CM

## 2020-02-01 DIAGNOSIS — M25562 Pain in left knee: Secondary | ICD-10-CM

## 2020-02-01 DIAGNOSIS — R293 Abnormal posture: Secondary | ICD-10-CM

## 2020-02-01 DIAGNOSIS — M5442 Lumbago with sciatica, left side: Secondary | ICD-10-CM | POA: Diagnosis not present

## 2020-02-01 MED ORDER — FUROSEMIDE 20 MG PO TABS
ORAL_TABLET | ORAL | 0 refills | Status: DC
Start: 2020-02-01 — End: 2020-02-14

## 2020-02-01 NOTE — Telephone Encounter (Signed)
-----   Message from Jettie Booze, MD sent at 01/31/2020 11:15 PM EDT ----- Please follow recs below regarding Lasix from Dr. Posey Pronto. BMet in one week.   JV ----- Message ----- From: Elmarie Shiley, MD Sent: 01/31/2020   3:30 PM EDT To: Jettie Booze, MD  Yes, start furosemide 40mg  daily for 5 days and then drop to 20mg  daily.   ----- Message ----- From: Jettie Booze, MD Sent: 01/28/2020   5:18 PM EDT To: Elmarie Shiley, MD  Hannah Beat, He had significant edema on exam today.  Stage 4 CKD.  Can we try diuretic? Running out of options since stopping amlodipine may raise his BP even more.  I think you may be seeing him soon.  THanks.  JV

## 2020-02-01 NOTE — Telephone Encounter (Signed)
Patient made aware of recommendations to start lasix 40 mg QD x 5 days then decrease to 20 mg QD. BMET scheduled for 8/24. Rx sent to preferred pharmacy. Patient verbalized understanding and thanked me for the call.

## 2020-02-01 NOTE — Therapy (Signed)
[facility] [address]  [place], [place], [address] Phone: [phone]   Fax:  [phone]  Physical Therapy Treatment  Patient Details  Name: Joshua Thompson MRN: [id number] Date of Birth: October 03, 1945 Referring Provider (PT): Crosby Oyster [place], [place]   Encounter Date: 02/01/2020   PT End of Session - 02/01/20 2353    Visit Number 2    Number of Visits 13    Date for PT Re-Evaluation 03/08/20    Authorization Type [employer]    PT Start Time 0850    PT Stop Time 0929    PT Time Calculation (min) 39 min    Activity Tolerance Patient tolerated treatment well;Patient limited by pain    Behavior During Therapy [facility] for tasks assessed/performed           Past Medical History:  Diagnosis Date  . Coronary artery disease   . GERD (gastroesophageal reflux disease)   . Hyperlipidemia   . Hypertension     Past Surgical History:  Procedure Laterality Date  . CORONARY ANGIOPLASTY     3 STENTS  . TRANSURETHRAL RESECTION OF PROSTATE     HAD DONE THREE TIMES    Vitals:   02/01/20 0909  BP: (!) 161/60     Subjective Assessment - 02/01/20 0852    Subjective Back feels the same. Neck is bothering him more today, especially with rotation. Has performed his HEP but lost his rubber band. Reports no change in LE edema since last session, MD has not yet changed his meds for this.    Pertinent History HTN, HLD, GERD, CAD    Diagnostic tests none recent    Patient Stated Goals decrease pain    Pain Score 4     Pain Location Back    Pain Orientation Right;Left;Lower    Pain Descriptors / Indicators Aching    Pain Type Chronic pain    Pain Score 6    Pain Location Neck    Pain Orientation Right    Pain Descriptors / Indicators Sharp    Pain Type Acute pain              [facility] PT Assessment - 02/01/20 0001      ROM / Strength   AROM / PROM / Strength PROM      AROM   AROM Assessment Site  Cervical;Knee    Right/Left Knee Right;Left    Right Knee Extension 0    Right Knee Flexion 130    Left Knee Extension 1    Left Knee Flexion 134    Cervical Flexion 31   pain   Cervical Extension 37    Cervical - Right Side Bend 25   pain   Cervical - Left Side Bend 32    Cervical - Right Rotation 41   pain   Cervical - Left Rotation 56      PROM   PROM Assessment Site Knee    Right/Left Knee Right;Left    Right Knee Extension 0    Right Knee Flexion 140    Left Knee Extension 1    Left Knee Flexion 140   slight pain                        [facility] Adult PT Treatment/Exercise - 02/01/20 0001      Exercises   Exercises Lumbar;Neck      Lumbar Exercises: Stretches   Passive Hamstring Stretch  Right;Left;30 seconds;2 reps    Passive Hamstring Stretch Limitations supine with strap   cueing for alignment   Lower Trunk Rotation Limitations x20   limited ROM; heavy cueing for form correction     Lumbar Exercises: Aerobic   Nustep L3 x 6 min (UEs/LEs)      Lumbar Exercises: Seated   Other Seated Lumbar Exercises L HS curl with green TB x10   cues to slow speed     Lumbar Exercises: Supine   Bridge 10 reps    Bridge Limitations slow and slightly decreased ROM                  PT Education - 02/01/20 0936    Education Details review of HEP and re-administered TB's    Person(s) Educated Patient    Methods Explanation;Demonstration;Tactile cues;Verbal cues    Comprehension Verbalized understanding;Returned demonstration;Need further instruction            PT Short Term Goals - 02/01/20 1021      PT SHORT TERM GOAL #1   Title Patient to be independent with initial HEP.    Time 3    Period Weeks    Status On-going    Target Date 02/16/20             PT Long Term Goals - 02/01/20 1022      PT LONG TERM GOAL #1   Title Patient to be independent with advanced HEP.    Time 6    Period Weeks    Status On-going      PT LONG TERM GOAL #2    Title Patient to demonstrate cervical and lumbar AROM without pain limiting.    Time 6    Period Weeks    Status On-going      PT LONG TERM GOAL #3   Title Patient to demonstrate B LE strength >/=4+/5.    Time 6    Period Weeks    Status On-going      PT LONG TERM GOAL #4   Title Patient to report tolerance for weeding/yardwork for 30 minutes without pain limiting.    Time 6    Period Weeks    Status On-going      PT LONG TERM GOAL #5   Title Patient to report ability to navigate stairs to get to his bedroom without pain.    Time 6    Period Weeks    Status On-going                 Plan - 02/01/20 6222    Clinical Impression Statement Patient reporting compliance with his HEP and denies questions. Reports no change in LE edema since last session, and reports that his MD has not yet changed his meds for this. Cervical AROM today demonstrated pain and limited ROM with flexion, R sidebending, and R rotation. L knee AROM nearly symmetrical to opposite LE but with slight pain at end-range flexion. Reviewed HEP for max carryover. Patient required heavy cueing for form correction and demonstrated good carryover after review. Patient tolerated bridges well but performed at slow pace and with slightly limited hip extension. Re-administered TB's as patient lost his, and instructed him on proper use. Patient reported understanding of all edu provided today and without complaints at end of session. Plan to instruct patient on HEP to address pain and limitation with cervical flexion, R sidebending, and R rotation next session.    Comorbidities HTN, HLD, GERD, CAD  PT Treatment/Interventions ADLs/Self Care Home Management;Cryotherapy;Electrical Stimulation;Moist Heat;Therapeutic exercise;Therapeutic activities;Traction;Functional mobility training;Stair training;Gait training;Ultrasound;Neuromuscular re-education;Patient/family education;Manual techniques;Vasopneumatic Device;Taping;Energy  conservation;Dry needling;Passive range of motion    PT Next Visit Plan instruct patient on cervical HEP to improve flexion, R SBing, R rotation, address UE strength, progress core stability and lumbopelvic ROM    Consulted and Agree with Plan of Care Patient           Patient will benefit from skilled therapeutic intervention in order to improve the following deficits and impairments:  Hypomobility, Decreased activity tolerance, Decreased strength, Pain, Increased fascial restricitons, Difficulty walking, Increased muscle spasms, Improper body mechanics, Decreased range of motion, Postural dysfunction, Impaired flexibility  Visit Diagnosis: Chronic bilateral low back pain with left-sided sciatica  Cervicalgia  Chronic pain of left knee  Difficulty in walking, not elsewhere classified  Abnormal posture     Problem List Patient Active Problem List   Diagnosis Date Noted  . Seasonal allergies 10/06/2019  . Chronic bilateral low back pain without sciatica 10/06/2019  . Hyperkalemia 02/12/2015  . Urinary retention 02/12/2015  . CKD (chronic kidney disease), stage III 02/12/2015  . AKI (acute kidney injury) ([place]) 02/12/2015  . Vertigo 02/12/2015  . Essential hypertension 10/21/2013  . Coronary atherosclerosis of native coronary artery 10/21/2013  . CKD (chronic kidney disease) stage 4, GFR 15-29 ml/min (HCC) 10/21/2013     Janene Harvey, PT, DPT 02/01/20 10:23 AM   [facility] [address]  [place], [place], [address] Phone: [phone]   Fax:  [phone]  Name: Joshua Thompson MRN: [id number] Date of Birth: 22-Jun-1945

## 2020-02-03 ENCOUNTER — Other Ambulatory Visit: Payer: Self-pay

## 2020-02-03 ENCOUNTER — Ambulatory Visit: Payer: 59

## 2020-02-03 DIAGNOSIS — R293 Abnormal posture: Secondary | ICD-10-CM

## 2020-02-03 DIAGNOSIS — M25562 Pain in left knee: Secondary | ICD-10-CM

## 2020-02-03 DIAGNOSIS — M5442 Lumbago with sciatica, left side: Secondary | ICD-10-CM | POA: Diagnosis not present

## 2020-02-03 DIAGNOSIS — M542 Cervicalgia: Secondary | ICD-10-CM

## 2020-02-03 DIAGNOSIS — R262 Difficulty in walking, not elsewhere classified: Secondary | ICD-10-CM

## 2020-02-03 DIAGNOSIS — G8929 Other chronic pain: Secondary | ICD-10-CM

## 2020-02-03 NOTE — Therapy (Addendum)
[facility] [address]  [place], [place], [address] Phone: [phone]   Fax:  [phone]  Physical Therapy Treatment  Patient Details  Name: Joshua Thompson MRN: [id number] Date of Birth: [birth year] Referring Provider (PT): Crosby Oyster [place], [place]   Encounter Date: 02/03/2020   PT End of Session - 02/03/20 0939    Visit Number 3    Number of Visits 13    Date for PT Re-Evaluation 03/08/20    Authorization Type [employer]    PT Start Time [phone]    PT Stop Time 1012    PT Time Calculation (min) 41 min    Activity Tolerance Patient tolerated treatment well;Patient limited by pain    Behavior During Therapy [facility] for tasks assessed/performed           Past Medical History:  Diagnosis Date  . Coronary artery disease   . GERD (gastroesophageal reflux disease)   . Hyperlipidemia   . Hypertension     Past Surgical History:  Procedure Laterality Date  . CORONARY ANGIOPLASTY     3 STENTS  . TRANSURETHRAL RESECTION OF PROSTATE     HAD DONE THREE TIMES    There were no vitals filed for this visit.   Subjective Assessment - 02/03/20 0937    Subjective Pt. noting "raising leg up" HEP exercise is very painful.    Pertinent History HTN, HLD, GERD, CAD    Diagnostic tests none recent    Patient Stated Goals decrease pain    Currently in Pain? Yes    Pain Score 4     Pain Location Back    Pain Orientation Right;Left;Lower    Pain Descriptors / Indicators Dull    Pain Type Chronic pain                             [facility] Adult PT Treatment/Exercise - 02/03/20 0001      Neck Exercises: Seated   Shoulder Shrugs 15 reps    Shoulder Shrugs Limitations retraction     Shoulder Rolls 15 reps;Backwards    Shoulder Rolls Limitations reverse    Other Seated Exercise Scapular retraction 5" x 10 reps      Lumbar Exercises: Stretches   Passive Hamstring Stretch Right;Left;1 rep;30 seconds     Passive Hamstring Stretch Limitations B supine with strap       Lumbar Exercises: Aerobic   Nustep L4 x 6 min (UEs/LEs)      Lumbar Exercises: Supine   Bridge --   x 12 reps   Bridge Limitations cues for proper hold times      Neck Exercises: Stretches   Upper Trapezius Stretch Right;2 reps;30 seconds    Upper Trapezius Stretch Limitations R hand anchored on R table     Levator Stretch Right;2 reps;30 seconds    Levator Stretch Limitations R hand anchored on chair                   PT Education - 02/03/20 1125    Education Details HEP update;  LS, UT, scalenes stretches, scapular retraction, shoulder rolls    Person(s) Educated Patient    Methods Explanation;Demonstration;Verbal cues;Handout    Comprehension Verbalized understanding;Returned demonstration;Verbal cues required            PT Short Term Goals - 02/01/20 1021      PT SHORT TERM GOAL #1   Title Patient to  be independent with initial HEP.    Time 3    Period Weeks    Status On-going    Target Date 02/16/20             PT Long Term Goals - 02/01/20 1022      PT LONG TERM GOAL #1   Title Patient to be independent with advanced HEP.    Time 6    Period Weeks    Status On-going      PT LONG TERM GOAL #2   Title Patient to demonstrate cervical and lumbar AROM without pain limiting.    Time 6    Period Weeks    Status On-going      PT LONG TERM GOAL #3   Title Patient to demonstrate B LE strength >/=4+/5.    Time 6    Period Weeks    Status On-going      PT LONG TERM GOAL #4   Title Patient to report tolerance for weeding/yardwork for 30 minutes without pain limiting.    Time 6    Period Weeks    Status On-going      PT LONG TERM GOAL #5   Title Patient to report ability to navigate stairs to get to his bedroom without pain.    Time 6    Period Weeks    Status On-going                 Plan - 02/03/20 0946    Clinical Impression Statement Pt. reporting no issues with  HEP.  Does note R-sided neck/shoulder pain today.  MT addressed increased tension/taut bands in R scalenes/UT which responded well.  Introduced cervical stretching and gentle ROM activities for reduction in tension which were tolerated well without increased pain thus HEP updated accordingly.  Ended visit with pt. verbalizing understanding of HEP handout.    Comorbidities HTN, HLD, GERD, CAD    Rehab Potential Good    PT Frequency 2x / week    PT Duration 6 weeks    PT Treatment/Interventions ADLs/Self Care Home Management;Cryotherapy;Electrical Stimulation;Moist Heat;Therapeutic exercise;Therapeutic activities;Traction;Functional mobility training;Stair training;Gait training;Ultrasound;Neuromuscular re-education;Patient/family education;Manual techniques;Vasopneumatic Device;Taping;Energy conservation;Dry needling;Passive range of motion    Consulted and Agree with Plan of Care Patient           Patient will benefit from skilled therapeutic intervention in order to improve the following deficits and impairments:  Hypomobility, Decreased activity tolerance, Decreased strength, Pain, Increased fascial restricitons, Difficulty walking, Increased muscle spasms, Improper body mechanics, Decreased range of motion, Postural dysfunction, Impaired flexibility  Visit Diagnosis: Chronic bilateral low back pain with left-sided sciatica  Cervicalgia  Chronic pain of left knee  Difficulty in walking, not elsewhere classified  Abnormal posture     Problem List Patient Active Problem List   Diagnosis Date Noted  . Seasonal allergies 10/06/2019  . Chronic bilateral low back pain without sciatica 10/06/2019  . Hyperkalemia 02/12/2015  . Urinary retention 02/12/2015  . CKD (chronic kidney disease), stage III 02/12/2015  . AKI (acute kidney injury) ([place]) 02/12/2015  . Vertigo 02/12/2015  . Essential hypertension 10/21/2013  . Coronary atherosclerosis of native coronary artery 10/21/2013  . CKD  (chronic kidney disease) stage 4, GFR 15-29 ml/min (HCC) 10/21/2013    Bess Harvest, PTA 02/03/20 11:31 AM   [facility] [address]  [place], [place], [address] Phone: [phone]   Fax:  [phone]  Name: Joshua Thompson MRN: [id number] Date of  Birth: June 11, 1946   PHYSICAL THERAPY DISCHARGE SUMMARY  Visits from Start of Care: 3  Current functional level related to goals / functional outcomes: Unable to assess; patient did not return d/t going to see a chiropractor    Remaining deficits: Unable to assess   Education / Equipment: HEP  Plan: Patient agrees to discharge.  Patient goals were not met. Patient is being discharged due to the patient's request.  ?????     Janene Harvey, PT, DPT 03/02/20 1:40 PM

## 2020-02-08 ENCOUNTER — Other Ambulatory Visit: Payer: Self-pay

## 2020-02-08 ENCOUNTER — Other Ambulatory Visit: Payer: 59 | Admitting: *Deleted

## 2020-02-08 DIAGNOSIS — R609 Edema, unspecified: Secondary | ICD-10-CM

## 2020-02-08 LAB — BASIC METABOLIC PANEL
BUN/Creatinine Ratio: 13 (ref 10–24)
BUN: 52 mg/dL — ABNORMAL HIGH (ref 8–27)
CO2: 19 mmol/L — ABNORMAL LOW (ref 20–29)
Calcium: 9.8 mg/dL (ref 8.6–10.2)
Chloride: 100 mmol/L (ref 96–106)
Creatinine, Ser: 3.89 mg/dL — ABNORMAL HIGH (ref 0.76–1.27)
GFR calc Af Amer: 17 mL/min/{1.73_m2} — ABNORMAL LOW (ref >59)
GFR calc non Af Amer: 14 mL/min/{1.73_m2} — ABNORMAL LOW (ref >59)
Glucose: 115 mg/dL — ABNORMAL HIGH (ref 65–99)
Potassium: 4.3 mmol/L (ref 3.5–5.2)
Sodium: 141 mmol/L (ref 134–144)

## 2020-02-10 ENCOUNTER — Ambulatory Visit: Payer: 59

## 2020-02-14 ENCOUNTER — Encounter: Payer: Self-pay | Admitting: Family Medicine

## 2020-02-14 ENCOUNTER — Ambulatory Visit (INDEPENDENT_AMBULATORY_CARE_PROVIDER_SITE_OTHER): Payer: 59 | Admitting: Family Medicine

## 2020-02-14 ENCOUNTER — Other Ambulatory Visit: Payer: Self-pay

## 2020-02-14 VITALS — BP 138/60 | HR 73 | Temp 98.2°F | Ht 67.0 in | Wt 154.1 lb

## 2020-02-14 DIAGNOSIS — I10 Essential (primary) hypertension: Secondary | ICD-10-CM

## 2020-02-14 DIAGNOSIS — K219 Gastro-esophageal reflux disease without esophagitis: Secondary | ICD-10-CM

## 2020-02-14 DIAGNOSIS — J302 Other seasonal allergic rhinitis: Secondary | ICD-10-CM

## 2020-02-14 MED ORDER — FLUTICASONE PROPIONATE 50 MCG/ACT NA SUSP: 2.0000 | Freq: Every day | NASAL | 6 refills | Status: DC

## 2020-02-14 MED ORDER — FUROSEMIDE 40 MG PO TABS
ORAL_TABLET | ORAL | 3 refills | Status: DC
Start: 2020-02-14 — End: 2022-07-11

## 2020-02-14 MED ORDER — RABEPRAZOLE SODIUM 20 MG PO TBEC: 20.0000 mg | DELAYED_RELEASE_TABLET | Freq: Every day | ORAL | 2 refills | Status: DC

## 2020-02-14 NOTE — Progress Notes (Signed)
Chief Complaint  Patient presents with  . Follow-up    BLOOD PRESSURE    Subjective Joshua Thompson is a 74 y.o. male who presents for hypertension follow up. He does monitor home blood pressures. Blood pressures ranging from 130-140's/70's on average. He is compliant with medications- Coreg 25 mg/d, Norvasc 5 mg/d, Hydralazine 100 mg TID, lisinopril 10 mg/d. Patient has these side effects of medication: none He is adhering to a healthy diet overall. Current exercise: some walking   Past Medical History:  Diagnosis Date  . Coronary artery disease   . GERD (gastroesophageal reflux disease)   . Hyperlipidemia   . Hypertension     Exam BP (!) 152/62 (BP Location: Left Arm, Patient Position: Sitting, Cuff Size: Normal)   Pulse 73   Temp 98.2 F (36.8 C) (Oral)   Ht 5\' 7"  (1.702 m)   Wt 154 lb 2 oz (69.9 kg)   SpO2 98%   BMI 24.14 kg/m  General:  well developed, well nourished, in no apparent distress Heart: RRR, no bruits, 2+ pitting b/l LE edema Lungs: clear to ausculation, no accessory muscle use Psych: well oriented with normal range of affect and appropriate judgment/insight  Essential hypertension  Gastroesophageal reflux disease - Plan: RABEprazole (ACIPHEX) 20 MG tablet  Seasonal allergies - Plan: fluticasone (FLONASE) 50 MCG/ACT nasal spray  Cont care. Counseled on diet and exercise. F/u in 6 mo for CPE. The patient voiced understanding and agreement to the plan.  Stark, DO 02/14/20  7:58 AM

## 2020-02-14 NOTE — Patient Instructions (Addendum)
Keep the diet clean and stay active.  Let me know if you need refills.  Note to physical therapy team: Please consider spinal stenosis as he got worse with HEP.  Make sure your "wellness visit" isn't a physical as we did that on 08/24/19.   Let us know if you need anything.

## 2020-02-24 ENCOUNTER — Encounter: Payer: 59 | Admitting: Physical Therapy

## 2020-03-07 ENCOUNTER — Ambulatory Visit: Payer: 59 | Admitting: Family Medicine

## 2020-03-09 ENCOUNTER — Other Ambulatory Visit: Payer: Self-pay | Admitting: Family Medicine

## 2020-03-09 ENCOUNTER — Encounter: Payer: 59 | Admitting: Physical Therapy

## 2020-03-09 DIAGNOSIS — J302 Other seasonal allergic rhinitis: Secondary | ICD-10-CM

## 2020-04-23 ENCOUNTER — Other Ambulatory Visit: Payer: Self-pay | Admitting: Interventional Cardiology

## 2020-05-02 ENCOUNTER — Other Ambulatory Visit: Payer: Self-pay | Admitting: Family Medicine

## 2020-05-02 DIAGNOSIS — I2510 Atherosclerotic heart disease of native coronary artery without angina pectoris: Secondary | ICD-10-CM

## 2020-08-09 ENCOUNTER — Encounter: Payer: Self-pay | Admitting: Family Medicine

## 2020-08-09 ENCOUNTER — Other Ambulatory Visit: Payer: Self-pay | Admitting: Family Medicine

## 2020-08-09 ENCOUNTER — Ambulatory Visit (INDEPENDENT_AMBULATORY_CARE_PROVIDER_SITE_OTHER): Payer: 59 | Admitting: Family Medicine

## 2020-08-09 ENCOUNTER — Other Ambulatory Visit: Payer: Self-pay

## 2020-08-09 VITALS — BP 152/82 | HR 70 | Temp 98.1°F | Ht 67.0 in | Wt 158.5 lb

## 2020-08-09 DIAGNOSIS — I25119 Atherosclerotic heart disease of native coronary artery with unspecified angina pectoris: Secondary | ICD-10-CM

## 2020-08-09 DIAGNOSIS — I10 Essential (primary) hypertension: Secondary | ICD-10-CM | POA: Diagnosis not present

## 2020-08-09 DIAGNOSIS — Z9849 Cataract extraction status, unspecified eye: Secondary | ICD-10-CM

## 2020-08-09 DIAGNOSIS — M1A9XX0 Chronic gout, unspecified, without tophus (tophi): Secondary | ICD-10-CM | POA: Diagnosis not present

## 2020-08-09 LAB — COMPREHENSIVE METABOLIC PANEL
ALT: 10 U/L (ref 0–53)
AST: 15 U/L (ref 0–37)
Albumin: 4.3 g/dL (ref 3.5–5.2)
Alkaline Phosphatase: 71 U/L (ref 39–117)
BUN: 46 mg/dL — ABNORMAL HIGH (ref 6–23)
CO2: 26 mEq/L (ref 19–32)
Calcium: 9.5 mg/dL (ref 8.4–10.5)
Chloride: 105 mEq/L (ref 96–112)
Creatinine, Ser: 3.46 mg/dL — ABNORMAL HIGH (ref 0.40–1.50)
GFR: 16.89 mL/min — ABNORMAL LOW (ref >60.00)
Glucose, Bld: 99 mg/dL (ref 70–99)
Potassium: 4.2 mEq/L (ref 3.5–5.1)
Sodium: 139 mEq/L (ref 135–145)
Total Bilirubin: 0.6 mg/dL (ref 0.2–1.2)
Total Protein: 7.2 g/dL (ref 6.0–8.3)

## 2020-08-09 LAB — LIPID PANEL
Cholesterol: 150 mg/dL (ref 0–200)
HDL: 51.4 mg/dL (ref >39.00)
LDL Cholesterol: 78 mg/dL (ref 0–99)
NonHDL: 98.4
Total CHOL/HDL Ratio: 3
Triglycerides: 100 mg/dL (ref 0.0–149.0)
VLDL: 20 mg/dL (ref 0.0–40.0)

## 2020-08-09 LAB — CBC
HCT: 37 % — ABNORMAL LOW (ref 39.0–52.0)
Hemoglobin: 12.1 g/dL — ABNORMAL LOW (ref 13.0–17.0)
MCHC: 32.7 g/dL (ref 30.0–36.0)
MCV: 95.9 fl (ref 78.0–100.0)
Platelets: 234 10*3/uL (ref 150.0–400.0)
RBC: 3.86 Mil/uL — ABNORMAL LOW (ref 4.22–5.81)
RDW: 14.3 % (ref 11.5–15.5)
WBC: 4.7 10*3/uL (ref 4.0–10.5)

## 2020-08-09 LAB — URIC ACID: Uric Acid, Serum: 6.1 mg/dL (ref 4.0–7.8)

## 2020-08-09 MED ORDER — CHLORTHALIDONE 25 MG PO TABS
25.0000 mg | ORAL_TABLET | ORAL | 2 refills | Status: DC
Start: 2020-08-09 — End: 2020-08-17

## 2020-08-09 NOTE — Patient Instructions (Signed)
If you do not hear anything about your referral in the next 1-2 weeks, call our office and ask for an update.  We will be in touch regarding your labs.  Check your blood pressures 2-3 times per week, alternating the time of day you check it. If it is high, considering waiting 1-2 minutes and rechecking. If it gets higher, your anxiety is likely creeping up and we should avoid rechecking.   Keep the diet clean and stay active.  Let us know if you need anything.

## 2020-08-09 NOTE — Progress Notes (Signed)
CC: f/u  Subjective Joshua Thompson is a 75 y.o. male who presents for hypertension follow up. He does monitor home blood pressures. Blood pressures ranging from 150-170's/80-90's on average. He is compliant with medications lisinopril 10 mg daily, hydralazine 100 mg 3 times daily, Coreg 25 mg twice daily, Norvasc 5 mg twice daily. Patient has these side effects of medication: none He is sometimes adhering to a healthy diet overall. Current exercise: walking No Cp or SOB.    Past Medical History:  Diagnosis Date  . Coronary artery disease   . GERD (gastroesophageal reflux disease)   . Hyperlipidemia   . Hypertension     Exam BP (!) 152/82 (BP Location: Left Arm, Patient Position: Sitting, Cuff Size: Normal)   Pulse 70   Temp 98.1 F (36.7 C) (Oral)   Ht '5\' 7"'$  (1.702 m)   Wt 158 lb 8 oz (71.9 kg)   SpO2 98%   BMI 24.82 kg/m  General:  well developed, well nourished, in no apparent distress Heart: RRR, trace left LE pitting edema Lungs: clear to auscultation, no accessory muscle use Psych: well oriented with normal range of affect and appropriate judgment/insight  Essential hypertension - Plan: CBC  Atherosclerosis of native coronary artery of native heart with angina pectoris (HCC) - Plan: Lipid panel, Comprehensive metabolic panel  Chronic gout without tophus, unspecified cause, unspecified site - Plan: Uric acid  History of cataract extraction, unspecified laterality - Plan: Ambulatory referral to Ophthalmology  1. Counseled on diet and exercise.  Start chlorthalidone 25 mg every other day.  We will check his electrolytes and renal function next week.  Continue above medications. 2.  Check labs, continue statin. 3.  Check uric acid, continue allopurinol 4.  We will get him set up with an ophthalmologist. F/u in 1 month to recheck #1. The patient voiced understanding and agreement to the plan.  [place], DO 08/09/20  10:49 AM

## 2020-08-10 ENCOUNTER — Other Ambulatory Visit: Payer: Self-pay | Admitting: Family Medicine

## 2020-08-10 DIAGNOSIS — J302 Other seasonal allergic rhinitis: Secondary | ICD-10-CM

## 2020-08-10 DIAGNOSIS — K219 Gastro-esophageal reflux disease without esophagitis: Secondary | ICD-10-CM

## 2020-08-17 ENCOUNTER — Other Ambulatory Visit: Payer: Self-pay | Admitting: Family Medicine

## 2020-08-17 ENCOUNTER — Other Ambulatory Visit: Payer: 59

## 2020-08-17 MED ORDER — INDAPAMIDE 1.25 MG PO TABS: 1.2500 mg | ORAL_TABLET | Freq: Every day | ORAL | 3 refills | Status: DC

## 2020-08-25 ENCOUNTER — Other Ambulatory Visit: Payer: 59

## 2020-09-06 ENCOUNTER — Ambulatory Visit: Payer: 59 | Admitting: Family Medicine

## 2020-09-07 ENCOUNTER — Other Ambulatory Visit: Payer: Self-pay | Admitting: Family Medicine

## 2020-09-07 DIAGNOSIS — I10 Essential (primary) hypertension: Secondary | ICD-10-CM

## 2020-09-09 ENCOUNTER — Other Ambulatory Visit: Payer: Self-pay | Admitting: Family Medicine

## 2020-10-04 ENCOUNTER — Encounter: Payer: Self-pay | Admitting: Family Medicine

## 2020-10-04 ENCOUNTER — Other Ambulatory Visit: Payer: Self-pay

## 2020-10-04 ENCOUNTER — Ambulatory Visit (INDEPENDENT_AMBULATORY_CARE_PROVIDER_SITE_OTHER): Payer: 59 | Admitting: Family Medicine

## 2020-10-04 VITALS — BP 182/78 | HR 63 | Temp 98.0°F

## 2020-10-04 DIAGNOSIS — N184 Chronic kidney disease, stage 4 (severe): Secondary | ICD-10-CM | POA: Diagnosis not present

## 2020-10-04 DIAGNOSIS — I701 Atherosclerosis of renal artery: Secondary | ICD-10-CM | POA: Diagnosis not present

## 2020-10-04 DIAGNOSIS — I10 Essential (primary) hypertension: Secondary | ICD-10-CM

## 2020-10-04 LAB — BASIC METABOLIC PANEL
BUN: 48 mg/dL — ABNORMAL HIGH (ref 6–23)
CO2: 26 mEq/L (ref 19–32)
Calcium: 9.3 mg/dL (ref 8.4–10.5)
Chloride: 105 mEq/L (ref 96–112)
Creatinine, Ser: 3.68 mg/dL — ABNORMAL HIGH (ref 0.40–1.50)
GFR: 15.67 mL/min — ABNORMAL LOW (ref >60.00)
Glucose, Bld: 93 mg/dL (ref 70–99)
Potassium: 4.7 mEq/L (ref 3.5–5.1)
Sodium: 139 mEq/L (ref 135–145)

## 2020-10-04 MED ORDER — SPIRONOLACTONE 25 MG PO TABS: 12.5000 mg | ORAL_TABLET | Freq: Every day | ORAL | 3 refills | Status: DC

## 2020-10-04 NOTE — Progress Notes (Signed)
Chief Complaint  Patient presents with  . Hypertension    Subjective Joshua Thompson is a 75 y.o. male who presents for hypertension follow up. He does monitor home blood pressures. Blood pressures ranging from 180-200's/80-90's on average. He is compliant with medications-Norvasc 5 mg twice daily, Coreg 25 mg twice daily, lisinopril 10 mg daily, indapamide 1.25 mg daily, hydralazine 100 mg 3 times daily. Patient has these side effects of medication: none He is adhering to a healthy diet overall. Current exercise: walking No CP or SOB.    Past Medical History:  Diagnosis Date  . Coronary artery disease   . GERD (gastroesophageal reflux disease)   . Hyperlipidemia   . Hypertension     Exam BP (!) 182/78 (BP Location: Left Arm, Patient Position: Sitting, Cuff Size: Normal)   Pulse 63   Temp 98 F (36.7 C) (Oral)   SpO2 96%  General:  well developed, well nourished, in no apparent distress Heart: RRR, no bruits, 1+ pitting bilateral LE edema Lungs: clear to auscultation, no accessory muscle use Psych: well oriented with normal range of affect and appropriate judgment/insight  Resistant hypertension - Plan: spironolactone (ALDACTONE) 25 MG tablet, Basic metabolic panel, Basic metabolic panel  Stage 4 chronic kidney disease (HCC), Chronic  RAS (renal artery stenosis) (HCC), Chronic  Status: Chronic, uncontrolled.  Aspirin lactone.  Continue Norvasc 5 mg twice daily, Coreg 25 mg twice daily, lisinopril 10 mg daily, indapamide 1.25 mg daily, hydralazine 100 mg 3 times daily. Counseled on diet and exercise.  Check BMP today and in 1 week. F/u in 1 week for labs, 3 weeks to recheck blood pressure. The patient voiced understanding and agreement to the plan.  [place], DO 10/04/20  11:00 AM

## 2020-10-04 NOTE — Patient Instructions (Signed)
Give [place] 2-3 business days to get the results of your labs back.   Keep the diet clean and stay active.  Continue to monitor blood pressures at home.  Let us know if you need anything.

## 2020-10-09 ENCOUNTER — Other Ambulatory Visit: Payer: Self-pay | Admitting: Family Medicine

## 2020-10-12 ENCOUNTER — Other Ambulatory Visit: Payer: Self-pay

## 2020-10-12 ENCOUNTER — Other Ambulatory Visit (INDEPENDENT_AMBULATORY_CARE_PROVIDER_SITE_OTHER): Payer: 59

## 2020-10-12 ENCOUNTER — Telehealth: Payer: Self-pay | Admitting: *Deleted

## 2020-10-12 DIAGNOSIS — I10 Essential (primary) hypertension: Secondary | ICD-10-CM | POA: Diagnosis not present

## 2020-10-12 LAB — BASIC METABOLIC PANEL
BUN: 55 mg/dL — ABNORMAL HIGH (ref 6–23)
CO2: 25 mEq/L (ref 19–32)
Calcium: 9.4 mg/dL (ref 8.4–10.5)
Chloride: 106 mEq/L (ref 96–112)
Creatinine, Ser: 4.21 mg/dL — ABNORMAL HIGH (ref 0.40–1.50)
GFR: 13.33 mL/min — CL (ref >60.00)
Glucose, Bld: 105 mg/dL — ABNORMAL HIGH (ref 70–99)
Potassium: 5.3 mEq/L — ABNORMAL HIGH (ref 3.5–5.1)
Sodium: 140 mEq/L (ref 135–145)

## 2020-10-12 NOTE — Telephone Encounter (Signed)
Noted. Pt w CKD following with nephrology.

## 2020-10-12 NOTE — Telephone Encounter (Signed)
CRITICAL VALUE STICKER  CRITICAL VALUE:  GFR  13.3  RECEIVER (on-site recipient of call): Kelle Darting, [place] NOTIFIED: 10/12/20 @ 11:15am   MESSENGER (representative from lab): [place]  MD NOTIFIED: Nani Ravens / Charlett Blake  TIME OF NOTIFICATION:  11:15am  RESPONSE:

## 2020-10-12 NOTE — Telephone Encounter (Signed)
See below

## 2020-10-13 ENCOUNTER — Other Ambulatory Visit: Payer: Self-pay | Admitting: Family Medicine

## 2020-10-13 DIAGNOSIS — E875 Hyperkalemia: Secondary | ICD-10-CM

## 2020-10-17 ENCOUNTER — Other Ambulatory Visit: Payer: Self-pay | Admitting: Nephrology

## 2020-10-17 DIAGNOSIS — N184 Chronic kidney disease, stage 4 (severe): Secondary | ICD-10-CM

## 2020-10-18 ENCOUNTER — Other Ambulatory Visit: Payer: 59

## 2020-10-25 ENCOUNTER — Telehealth: Payer: 59 | Admitting: Family Medicine

## 2020-10-25 NOTE — Progress Notes (Signed)
Cardiology Office Note   Date:  10/26/2020   ID:  Ajax, Stash [birth year], MRN [id number]  PCP:  Shelda Pal, DO    No chief complaint on file.  CAD/HTN  Wt Readings from Last 3 Encounters:  10/26/20 157 lb (71.2 kg)  08/09/20 158 lb 8 oz (71.9 kg)  02/14/20 154 lb 2 oz (69.9 kg)       History of Present Illness: Joshua Thompson is a 75 y.o. male  who has had HTN for about 15 years. In 2004, he had chest pain and then a stress test which was abnormal and then a DES (Taxus) was placed, while in [place]. He did well after that time.  Had heaviness in the chest prior while doing yoga prior to stent.    In 2010, he had leg pain and kidney pain. He was gaining weight and feeling ill and he was retaining fluid. His BP was high. His creatinine had reached 1.5+. He also had prostatic obstruction leading to the urinary issues.   In 09/2012, he had HTN, urine retention and then he had a catheter placed for his bladder. TURP was recommended, but it was also noted that his BP was up. Another coronary lesion was found. He was in renal failure, Cr 4.5. He then had two bare stents placed. Two weeks later, he had a TURP which was unsuccessful. He had a bladder obstruction. He had a repeat TURP.   In August 2014, he [place]. He followed up with urology at [facility]. It was noted that there was an irregularity of the surface of the inside of the bladder. His Cr. Is 3.0 at baseline.  Nebivolol has worked better than Coreg in the past but was more expensive.He now takes carvedilol BID and metoprolol for prn increased BP.Hydralazine was recommended to be taken TID, but he did not take this. Renal dysfunction has limited meds.  He had some sensation in his chest that resolved with an herbal medicinein 2018.  He had some high BP readings. He continues to be compliant with his medications.  Since the last visit, he  has followed with urology and was referred to nephrology for CRF. GFR was 22 in 2019 per his report.  Increase of Carvedilol has helped BP.   He reported high BP readings recently.  He has had some shortness of breath as well. Systolics in the [id number] range, typically at night.  He feels cold at night and starts shivering. Highest readings in AM are in the 150s.  Spironolactone was stopped due to hyperkalemia.    He has had some chest discomfort.    Past Medical History:  Diagnosis Date  . Coronary artery disease   . GERD (gastroesophageal reflux disease)   . Hyperlipidemia   . Hypertension     Past Surgical History:  Procedure Laterality Date  . CORONARY ANGIOPLASTY     3 STENTS  . TRANSURETHRAL RESECTION OF PROSTATE     HAD DONE THREE TIMES     Current Outpatient Medications  Medication Sig Dispense Refill  . allopurinol (ZYLOPRIM) 100 MG tablet TAKE 1 TABLET(100 MG) BY MOUTH DAILY 90 tablet 2  . amLODipine (NORVASC) 5 MG tablet Take 1 tablet (5 mg total) by mouth in the morning and at bedtime. 180 tablet 3  . atorvastatin (LIPITOR) 20 MG tablet TAKE 1 TABLET(20 MG) BY MOUTH DAILY 30 tablet 5  . carvedilol (COREG) 25 MG tablet TAKE 1 TABLET(25 MG)  BY MOUTH TWICE DAILY WITH A MEAL 60 tablet 3  . doxazosin (CARDURA) 2 MG tablet Take 2 mg by mouth at bedtime.    . fluticasone (FLONASE) 50 MCG/ACT nasal spray Place 2 sprays into both nostrils daily. 16 g 6  . furosemide (LASIX) 40 MG tablet Take 1 tab twice daily. 30 tablet 3  . hydrALAZINE (APRESOLINE) 50 MG tablet TAKE 2 TABLETS BY MOUTH 3 TIMES DAILY 180 tablet 2  . indapamide (LOZOL) 1.25 MG tablet Take 1 tablet (1.25 mg total) by mouth daily. 30 tablet 3  . levocetirizine (XYZAL) 5 MG tablet Take 1 tablet (5 mg total) by mouth every evening. 90 tablet 3  . lisinopril (ZESTRIL) 10 MG tablet TAKE 1 TABLET(10 MG) BY MOUTH DAILY 30 tablet 3  . nitroGLYCERIN (NITROSTAT) 0.4 MG SL tablet Place 1 tablet (0.4 mg total) under  the tongue every 5 (five) minutes as needed for chest pain. 25 tablet 5  . RABEprazole (ACIPHEX) 20 MG tablet Take 1 tablet (20 mg total) by mouth daily. 90 tablet 3  . triamcinolone cream (KENALOG) 0.1 % Apply 1 application topically 2 (two) times daily. 30 g 0   No current facility-administered medications for this visit.    Allergies:   Amoxicillin-pot clavulanate and Ampicillin    Social History:  The patient  reports that he has never smoked. He has never used smokeless tobacco. He reports that he does not drink alcohol and does not use drugs.   Family History:  The patient's family history includes Hypertension in his mother; Stroke in his maternal grandfather.    ROS:  Please see the history of present illness.   Otherwise, review of systems are positive for DOE.   All other systems are reviewed and negative.    PHYSICAL EXAM: VS:  BP (!) 142/64   Pulse 68   Ht '5\' 7"'$  (1.702 m)   Wt 157 lb (71.2 kg)   SpO2 99%   BMI 24.59 kg/m  , BMI Body mass index is 24.59 kg/m. GEN: Well nourished, well developed, in no acute distress  HEENT: normal  Neck: no JVD, carotid bruits, or masses Cardiac: RRR; no murmurs, rubs, or gallops,no edema  Respiratory:  clear to auscultation bilaterally, normal work of breathing GI: soft, nontender, nondistended, + BS MS: no deformity or atrophy  Skin: warm and dry, no rash Neuro:  Strength and sensation are intact Psych: euthymic mood, full affect   EKG:   The ekg ordered today demonstrates normal ECG, no ST segment changes   Recent Labs: 08/09/2020: ALT 10; Hemoglobin 12.1; Platelets 234.0 10/12/2020: BUN 55; Creatinine, Ser 4.21; Potassium 5.3 No hemolysis seen; Sodium 140   Lipid Panel    Component Value Date/Time   CHOL 150 08/09/2020 0817   CHOL 138 07/26/2019 0933   TRIG 100.0 08/09/2020 0817   HDL 51.40 08/09/2020 0817   HDL 55 07/26/2019 0933   CHOLHDL 3 08/09/2020 0817   VLDL 20.0 08/09/2020 0817   LDLCALC 78 08/09/2020 0817    LDLCALC 70 07/26/2019 0933     Other studies Reviewed: Additional studies/ records that were reviewed today with results demonstrating: labs reviewed.   ASSESSMENT AND PLAN:  1. CAD: Some shortness of breath with exertion.  With walking, it is worse.  Add Imdur 30 mg daily.  Plan for lexiscan myoview. He has had severe CAD in the past.  I reminded him about using SL NTG when needed.  He does not feel it has reached that  level.   2. HTN: Increased readings at night.  He will move the doxazosin to 6 PM.   Will reach out to Dr. Posey Pronto and Dr. Nani Ravens regarding whether clonidine would be an option as well if the change in timing of doxazosin did not help evening blood pressure readings. 3. CKD stage IV: Cr 4.2 in 4/22.  Cr. Typically it runs in the 3.5 to 3.7 range. 4. PAD: Subclavian disease on the right from prior carotid DOppler.  3+ radial pulses bilaterally.    Current medicines are reviewed at length with the patient today.  The patient concerns regarding his medicines were addressed.  The following changes have been made: Add isosorbide  Labs/ tests ordered today include:  No orders of the defined types were placed in this encounter.   Recommend 150 minutes/week of aerobic exercise Low fat, low carb, high fiber diet recommended  Disposition:   FU for stress test   Signed, Larae Grooms, MD  10/26/2020 9:27 AM    [facility] [address], [place], [place]  [address] Phone: [phone]; Fax: [phone]

## 2020-10-26 ENCOUNTER — Other Ambulatory Visit: Payer: Self-pay

## 2020-10-26 ENCOUNTER — Encounter: Payer: Self-pay | Admitting: Interventional Cardiology

## 2020-10-26 ENCOUNTER — Ambulatory Visit: Payer: 59 | Admitting: Interventional Cardiology

## 2020-10-26 ENCOUNTER — Encounter: Payer: Self-pay | Admitting: *Deleted

## 2020-10-26 VITALS — BP 142/64 | HR 68 | Ht 67.0 in | Wt 157.0 lb

## 2020-10-26 DIAGNOSIS — I25119 Atherosclerotic heart disease of native coronary artery with unspecified angina pectoris: Secondary | ICD-10-CM

## 2020-10-26 DIAGNOSIS — I10 Essential (primary) hypertension: Secondary | ICD-10-CM | POA: Diagnosis not present

## 2020-10-26 DIAGNOSIS — E782 Mixed hyperlipidemia: Secondary | ICD-10-CM

## 2020-10-26 DIAGNOSIS — N184 Chronic kidney disease, stage 4 (severe): Secondary | ICD-10-CM | POA: Diagnosis not present

## 2020-10-26 MED ORDER — ISOSORBIDE MONONITRATE ER 30 MG PO TB24
30.0000 mg | ORAL_TABLET | Freq: Every day | ORAL | 3 refills | Status: DC
Start: 2020-10-26 — End: 2021-09-05

## 2020-10-26 NOTE — Patient Instructions (Addendum)
Medication Instructions:  Your physician has recommended you make the following change in your medication: Start Isosorbide mononitrate 30 mg by mouth daily. Change time of Doxazosin to 6 PM  *If you need a refill on your cardiac medications before your next appointment, please call your pharmacy*   Lab Work: none If you have labs (blood work) drawn today and your tests are completely normal, you will receive your results only by: Marland Kitchen MyChart Message (if you have MyChart) OR . A paper copy in the mail If you have any lab test that is abnormal or we need to change your treatment, we will call you to review the results.   Testing/Procedures: Your physician has requested that you have a lexiscan myoview. For further information please visit [email or web address]. Please follow instruction sheet, as given.     Follow-Up: At [facility], you and your health needs are our priority.  As part of our continuing mission to provide you with exceptional heart care, we have created designated Provider Care Teams.  These Care Teams include your primary Cardiologist (physician) and Advanced Practice Providers (APPs -  Physician Assistants and Nurse Practitioners) who all work together to provide you with the care you need, when you need it.  We recommend signing up for the patient portal called "MyChart".  Sign up information is provided on this After Visit Summary.  MyChart is used to connect with patients for Virtual Visits (Telemedicine).  Patients are able to view lab/test results, encounter notes, upcoming appointments, etc.  Non-urgent messages can be sent to your provider as well.   To learn more about what you can do with MyChart, go to [email or web address].    Your next appointment:   6 month(s)  The format for your next appointment:   In Person  Provider:   You may see Larae Grooms, MD or one of the following Advanced Practice Providers on your designated Care Team:    Melina Copa, PA-C  Ermalinda Barrios, PA-C    Other Instructions

## 2020-11-03 ENCOUNTER — Encounter (HOSPITAL_COMMUNITY): Payer: 59

## 2020-11-06 ENCOUNTER — Ambulatory Visit: Payer: 59 | Admitting: Interventional Cardiology

## 2020-11-08 ENCOUNTER — Other Ambulatory Visit: Payer: Self-pay | Admitting: Family Medicine

## 2020-11-08 DIAGNOSIS — I2510 Atherosclerotic heart disease of native coronary artery without angina pectoris: Secondary | ICD-10-CM

## 2020-11-15 ENCOUNTER — Encounter (HOSPITAL_COMMUNITY): Payer: Self-pay | Admitting: *Deleted

## 2020-11-15 ENCOUNTER — Telehealth (HOSPITAL_COMMUNITY): Payer: Self-pay | Admitting: *Deleted

## 2020-11-15 NOTE — Telephone Encounter (Signed)
Attempted to call patient regarding upcoming appointment- no answer, unable to leave a message- letter sent via my chart with instructions.  Joshua Thompson

## 2020-11-20 ENCOUNTER — Telehealth (HOSPITAL_COMMUNITY): Payer: Self-pay | Admitting: *Deleted

## 2020-11-20 ENCOUNTER — Ambulatory Visit
Admission: RE | Admit: 2020-11-20 | Discharge: 2020-11-20 | Disposition: A | Discharge status: Home / Self Care | Payer: 59 | Source: Ambulatory Visit | Attending: Nephrology | Admitting: Nephrology

## 2020-11-20 DIAGNOSIS — N184 Chronic kidney disease, stage 4 (severe): Secondary | ICD-10-CM

## 2020-11-20 NOTE — Telephone Encounter (Signed)
Attempted to call patient regarding upcoming appointment- no answer.  Joshua Thompson  

## 2020-11-21 ENCOUNTER — Other Ambulatory Visit: Payer: Self-pay

## 2020-11-21 ENCOUNTER — Ambulatory Visit (HOSPITAL_COMMUNITY): Payer: 59 | Attending: Cardiology

## 2020-11-21 DIAGNOSIS — I25119 Atherosclerotic heart disease of native coronary artery with unspecified angina pectoris: Secondary | ICD-10-CM | POA: Diagnosis not present

## 2020-11-21 MED ORDER — REGADENOSON 0.4 MG/5ML IV SOLN
0.4000 mg | Freq: Once | INTRAVENOUS | Status: AC
  Administered 2020-11-21: 0.4 mg via INTRAVENOUS

## 2020-11-21 MED ORDER — TECHNETIUM TC 99M TETROFOSMIN IV KIT
11.0000 | PACK | Freq: Once | INTRAVENOUS | Status: AC | PRN
  Administered 2020-11-21: 11 via INTRAVENOUS
  Filled 2020-11-21: qty 11

## 2020-11-21 MED ORDER — TECHNETIUM TC 99M TETROFOSMIN IV KIT
30.0000 | PACK | Freq: Once | INTRAVENOUS | Status: AC | PRN
Start: 2020-11-21 — End: 2020-11-21
  Administered 2020-11-21: 30 via INTRAVENOUS
  Filled 2020-11-21: qty 30

## 2020-11-22 LAB — MYOCARDIAL PERFUSION IMAGING
LV dias vol: 83 mL (ref 62–150)
LV sys vol: 29 mL
Peak HR: 79 {beats}/min
Rest HR: 66 {beats}/min
SDS: 0
SRS: 0
SSS: 0
TID: 1.06

## 2021-01-07 ENCOUNTER — Other Ambulatory Visit: Payer: Self-pay | Admitting: Family Medicine

## 2021-01-18 ENCOUNTER — Other Ambulatory Visit: Payer: Self-pay | Admitting: Family Medicine

## 2021-01-18 DIAGNOSIS — I10 Essential (primary) hypertension: Secondary | ICD-10-CM

## 2021-01-23 ENCOUNTER — Other Ambulatory Visit: Payer: Self-pay | Admitting: Family Medicine

## 2021-01-23 DIAGNOSIS — J302 Other seasonal allergic rhinitis: Secondary | ICD-10-CM

## 2021-01-31 ENCOUNTER — Encounter (HOSPITAL_BASED_OUTPATIENT_CLINIC_OR_DEPARTMENT_OTHER): Payer: Self-pay

## 2021-01-31 ENCOUNTER — Other Ambulatory Visit: Payer: Self-pay

## 2021-01-31 ENCOUNTER — Emergency Department (HOSPITAL_BASED_OUTPATIENT_CLINIC_OR_DEPARTMENT_OTHER)
Admission: EM | Admit: 2021-01-31 | Discharge: 2021-01-31 | Disposition: A | Discharge status: Home / Self Care | Payer: 59 | Attending: Emergency Medicine | Admitting: Emergency Medicine

## 2021-01-31 DIAGNOSIS — S61215A Laceration without foreign body of left ring finger without damage to nail, initial encounter: Secondary | ICD-10-CM | POA: Diagnosis not present

## 2021-01-31 DIAGNOSIS — N184 Chronic kidney disease, stage 4 (severe): Secondary | ICD-10-CM | POA: Diagnosis not present

## 2021-01-31 DIAGNOSIS — Z79899 Other long term (current) drug therapy: Secondary | ICD-10-CM | POA: Diagnosis not present

## 2021-01-31 DIAGNOSIS — S60945A Unspecified superficial injury of left ring finger, initial encounter: Secondary | ICD-10-CM | POA: Diagnosis present

## 2021-01-31 DIAGNOSIS — W298XXA Contact with other powered powered hand tools and household machinery, initial encounter: Secondary | ICD-10-CM | POA: Insufficient documentation

## 2021-01-31 DIAGNOSIS — Z23 Encounter for immunization: Secondary | ICD-10-CM | POA: Insufficient documentation

## 2021-01-31 DIAGNOSIS — I2510 Atherosclerotic heart disease of native coronary artery without angina pectoris: Secondary | ICD-10-CM | POA: Insufficient documentation

## 2021-01-31 DIAGNOSIS — I129 Hypertensive chronic kidney disease with stage 1 through stage 4 chronic kidney disease, or unspecified chronic kidney disease: Secondary | ICD-10-CM | POA: Insufficient documentation

## 2021-01-31 MED ORDER — TETANUS-DIPHTH-ACELL PERTUSSIS 5-2.5-18.5 LF-MCG/0.5 IM SUSY
0.5000 mL | PREFILLED_SYRINGE | Freq: Once | INTRAMUSCULAR | Status: AC
  Administered 2021-01-31: 0.5 mL via INTRAMUSCULAR
  Filled 2021-01-31: qty 0.5

## 2021-01-31 MED ORDER — LIDOCAINE HCL (PF) 1 % IJ SOLN
5.0000 mL | Freq: Once | INTRAMUSCULAR | Status: AC
  Administered 2021-01-31: 5 mL
  Filled 2021-01-31: qty 5

## 2021-01-31 NOTE — ED Notes (Signed)
Lac to left 4th digit, approx 1.5 cm in length crescent shaped bleeding controlled by pressure, tdap unknown.

## 2021-01-31 NOTE — ED Provider Notes (Signed)
[facility] Provider Note   CSN: [id number] Arrival date & time: 01/31/21  1527     History Chief Complaint  Patient presents with   Finger Injury    Joshua Thompson is a 75 y.o. male.  Patient was trimming an outside plant at home using a power tool. Patient struck left ring finger with the tool, resulting in a laceration to the tip of the finger. Bleeding currently controlled. Tetanus out of date  The history is provided by the patient. No language interpreter was used.  Laceration Location:  Hand Hand laceration location:  L fingers Depth:  Cutaneous Quality: straight   Bleeding: controlled   Laceration mechanism:  Metal edge Pain details:    Quality:  Aching   Severity:  Mild   Timing:  Intermittent Foreign body present:  No foreign bodies Tetanus status:  Out of date     Past Medical History:  Diagnosis Date   Coronary artery disease    GERD (gastroesophageal reflux disease)    Hyperlipidemia    Hypertension     Patient Active Problem List   Diagnosis Date Noted   Seasonal allergies 10/06/2019   Chronic bilateral low back pain without sciatica 10/06/2019   Hyperkalemia 02/12/2015   Urinary retention 02/12/2015   CKD (chronic kidney disease), stage III ([place]) 02/12/2015   AKI (acute kidney injury) ([place]) 02/12/2015   Vertigo 02/12/2015   Essential hypertension 10/21/2013   Coronary atherosclerosis of native coronary artery 10/21/2013   CKD (chronic kidney disease) stage 4, GFR 15-29 ml/min (HCC) 10/21/2013    Past Surgical History:  Procedure Laterality Date   CORONARY ANGIOPLASTY     3 STENTS   TRANSURETHRAL RESECTION OF PROSTATE     HAD DONE THREE TIMES       Family History  Problem Relation Age of Onset   Hypertension Mother    Stroke Maternal Grandfather    Arrhythmia Neg Hx    Heart attack Neg Hx     Social History   Tobacco Use   Smoking status: Never   Smokeless tobacco: Never  Vaping Use    Vaping Use: Never used  Substance Use Topics   Alcohol use: No   Drug use: No    Home Medications Prior to Admission medications   Medication Sig Start Date End Date Taking? Authorizing Provider  allopurinol (ZYLOPRIM) 100 MG tablet TAKE 1 TABLET(100 MG) BY MOUTH DAILY 10/09/20   Nani Ravens, Crosby Oyster, DO  amLODipine (NORVASC) 5 MG tablet Take 1 tablet (5 mg total) by mouth in the morning and at bedtime. 01/28/20   Jettie Booze, MD  atorvastatin (LIPITOR) 20 MG tablet TAKE 1 TABLET(20 MG) BY MOUTH DAILY 11/08/20   Shelda Pal, DO  carvedilol (COREG) 25 MG tablet TAKE 1 TABLET(25 MG) BY MOUTH TWICE DAILY WITH A MEAL 01/19/21   Wendling, Crosby Oyster, DO  doxazosin (CARDURA) 2 MG tablet Take 2 mg by mouth at bedtime. 10/17/20   [provider]  fluticasone (FLONASE) 50 MCG/ACT nasal spray SHAKE LIQUID AND USE 2 SPRAYS IN [facility] NOSTRIL DAILY 01/23/21   Wendling, Crosby Oyster, DO  furosemide (LASIX) 40 MG tablet Take 1 tab twice daily. 02/14/20   Shelda Pal, DO  hydrALAZINE (APRESOLINE) 50 MG tablet TAKE 2 TABLETS BY MOUTH 3 TIMES DAILY 08/09/20   Shelda Pal, DO  isosorbide mononitrate (IMDUR) 30 MG 24 hr tablet Take 1 tablet (30 mg total) by mouth daily. 10/26/20   Jettie Booze,  MD  levocetirizine (XYZAL) 5 MG tablet Take 1 tablet (5 mg total) by mouth every evening. 08/10/20   Nani Ravens, Crosby Oyster, DO  lisinopril (ZESTRIL) 10 MG tablet TAKE 1 TABLET(10 MG) BY MOUTH DAILY 01/08/21   Wendling, Crosby Oyster, DO  nitroGLYCERIN (NITROSTAT) 0.4 MG SL tablet Place 1 tablet (0.4 mg total) under the tongue every 5 (five) minutes as needed for chest pain. 10/21/13   Jettie Booze, MD  RABEprazole (ACIPHEX) 20 MG tablet Take 1 tablet (20 mg total) by mouth daily. 08/10/20   Shelda Pal, DO  triamcinolone cream (KENALOG) 0.1 % Apply 1 application topically 2 (two) times daily. 06/22/19   Shelda Pal, DO    Allergies     Amoxicillin-pot clavulanate and Ampicillin  Review of Systems   Review of Systems  Skin:  Positive for wound.  All other systems reviewed and are negative.  Physical Exam Updated Vital Signs BP (!) 151/70 (BP Location: Right Arm)   Pulse 71   Temp 98.3 F (36.8 C) (Oral)   Resp 20   Ht '5\' 7"'$  (1.702 m)   Wt 74.8 kg   SpO2 100%   BMI 25.84 kg/m   Physical Exam Constitutional:      Appearance: Normal appearance.  HENT:     Head: Normocephalic.     Nose: Nose normal.  Eyes:     Conjunctiva/sclera: Conjunctivae normal.  Cardiovascular:     Rate and Rhythm: Normal rate.  Pulmonary:     Effort: Pulmonary effort is normal.  Musculoskeletal:        General: Normal range of motion.     Left hand: Laceration present.     Comments: 1.5 cm laceration to distal aspect of left ring finger.  Skin:    General: Skin is warm and dry.  Neurological:     Mental Status: He is alert and oriented to person, place, and time.  Psychiatric:        Mood and Affect: Mood normal.    ED Results / Procedures / Treatments   Labs (all labs ordered are listed, but only abnormal results are displayed) Labs Reviewed - No data to display  EKG None  Radiology No results found.  Procedures .Marland KitchenLaceration Repair  Date/Time: 01/31/2021 6:22 PM Performed by: Etta Quill, NP Authorized by: Etta Quill, NP   Consent:    Consent obtained:  Verbal   Consent given by:  Patient   Risks discussed:  Infection and poor cosmetic result Universal protocol:    Procedure explained and questions answered to patient or proxy's satisfaction: yes   Anesthesia:    Anesthesia method:  Local infiltration and nerve block   Local anesthetic:  Lidocaine 1% w/o epi   Block needle gauge:  27 G   Block anesthetic:  Lidocaine 1% w/o epi   Block injection procedure:  Anatomic landmarks identified, introduced needle, incremental injection, negative aspiration for blood and anatomic landmarks palpated   Block  outcome:  Incomplete block Laceration details:    Location:  Finger   Finger location:  L ring finger   Length (cm):  1.5 Pre-procedure details:    Preparation:  Patient was prepped and draped in usual sterile fashion Exploration:    Wound exploration: entire depth of wound visualized     Contaminated: no   Treatment:    Area cleansed with:  Povidone-iodine and saline   Amount of cleaning:  Standard   Irrigation solution:  Sterile saline   Irrigation method:  Syringe  Skin repair:    Repair method:  Sutures   Suture size:  4-0   Suture material:  Prolene   Number of sutures:  4 Repair type:    Repair type:  Simple Post-procedure details:    Dressing:  Antibiotic ointment and sterile dressing   Procedure completion:  Tolerated   Medications Ordered in ED Medications - No data to display  ED Course  I have reviewed the triage vital signs and the nursing notes.  Pertinent labs & imaging results that were available during my care of the patient were reviewed by me and considered in my medical decision making (see chart for details).    MDM Rules/Calculators/A&P                           Tetanus updated in ED. Laceration occurred < 12 hours prior to repair. Discussed laceration care with pt and answered questions. Pt to f-u for suture removal in 7-10 days and wound check sooner should there be signs of dehiscence or infection. Pt is hemodynamically stable with no complaints prior to dc.      Final Clinical Impression(s) / ED Diagnoses Final diagnoses:  Laceration of left ring finger without foreign body without damage to nail, initial encounter    Rx / DC Orders ED Discharge Orders     None        Etta Quill, NP 01/31/21 1831    Tegeler, Gwenyth Allegra, MD 02/01/21 [phone]

## 2021-01-31 NOTE — ED Triage Notes (Signed)
Pt reports he cut left ring finger on a tree trimmer ~1030am-lac noted to tip-no bleeding-gauze/kling dsg applied-NAD-steady gait

## 2021-01-31 NOTE — ED Notes (Signed)
Dressing applied with telfa and coban.

## 2021-01-31 NOTE — Discharge Instructions (Signed)
Your tetanus was updated in the ED today. You may follow-up with your primary care provider for suture removal in 7-10 days.

## 2021-02-06 ENCOUNTER — Other Ambulatory Visit: Payer: Self-pay | Admitting: Family Medicine

## 2021-02-06 DIAGNOSIS — I10 Essential (primary) hypertension: Secondary | ICD-10-CM

## 2021-02-13 ENCOUNTER — Ambulatory Visit (INDEPENDENT_AMBULATORY_CARE_PROVIDER_SITE_OTHER): Payer: 59 | Admitting: Family Medicine

## 2021-02-13 ENCOUNTER — Other Ambulatory Visit: Payer: Self-pay

## 2021-02-13 ENCOUNTER — Encounter: Payer: Self-pay | Admitting: Family Medicine

## 2021-02-13 VITALS — BP 132/71 | HR 69 | Temp 98.1°F | Ht 67.0 in | Wt 161.2 lb

## 2021-02-13 DIAGNOSIS — J302 Other seasonal allergic rhinitis: Secondary | ICD-10-CM | POA: Diagnosis not present

## 2021-02-13 DIAGNOSIS — M545 Low back pain, unspecified: Secondary | ICD-10-CM | POA: Diagnosis not present

## 2021-02-13 DIAGNOSIS — G8929 Other chronic pain: Secondary | ICD-10-CM | POA: Diagnosis not present

## 2021-02-13 DIAGNOSIS — Z4802 Encounter for removal of sutures: Secondary | ICD-10-CM

## 2021-02-13 MED ORDER — METHYLPREDNISOLONE ACETATE 80 MG/ML IJ SUSP
80.0000 mg | Freq: Once | INTRAMUSCULAR | Status: AC
  Administered 2021-02-13: 80 mg via INTRAMUSCULAR

## 2021-02-13 MED ORDER — LEVOCETIRIZINE DIHYDROCHLORIDE 5 MG PO TABS
5.0000 mg | ORAL_TABLET | Freq: Every evening | ORAL | 3 refills | Status: DC
Start: 2021-02-13 — End: 2022-04-22

## 2021-02-13 MED ORDER — AMLODIPINE BESYLATE 10 MG PO TABS
10.0000 mg | ORAL_TABLET | Freq: Every day | ORAL | 2 refills | Status: DC
Start: 2021-02-13 — End: 2021-09-04

## 2021-02-13 NOTE — Progress Notes (Signed)
Chief Complaint  Patient presents with   Suture / Staple Removal    Back pain allergies    Subjective: Patient is a 75 y.o. male here for back pain.  Chronic low back pain, did well with depo injection in the past. Getting worse over the past mo. He saw PT in the past and did not help. No recent inj or change in activity. No neuro s/s's. No bowel/bladder incontinence.   Lac on finger from hedging. 4 simple sutures placed. 1 fell out. Here to get the rest removed.   Runny/stuffy nose, coughing w itchy throat. Xyzal works well for him. Requesting a refill. No AE's. Takes when he has it.   Past Medical History:  Diagnosis Date   Coronary artery disease    GERD (gastroesophageal reflux disease)    Hyperlipidemia    Hypertension     Objective: BP 132/71   Pulse 69   Temp 98.1 F (36.7 C) (Oral)   Ht '5\' 7"'$  (1.702 m)   Wt 161 lb 4 oz (73.1 kg)   SpO2 98%   BMI 25.26 kg/m  General: Awake, appears stated age HEENT: MMM, EOMi MSK: +TTP over the lumbar parasp msc b/l Neuro: DTR's equal and symmetric throughout, no clonus, no cerebellar signs, gait is normal Lungs: No accessory muscle use Skin: Over the left fourth digit distal phalanx, there is a linear laceration with 3 loose simple sutures in place.  No erythema, fluctuance, excessive warmth, or drainage. Psych: Age appropriate judgment and insight, normal affect and mood  Assessment and Plan: Chronic bilateral low back pain without sciatica - Plan: methylPREDNISolone acetate (DEPO-MEDROL) injection 80 mg  Visit for suture removal  Seasonal allergies - Plan: levocetirizine (XYZAL) 5 MG tablet  Exacerbation of chronic issue.  Depo injection today.  We will refer him to physical medicine rehabilitation for possible injections if he has poor response to this.  Stretches and exercises recommended as well.  Heat, ice, Tylenol.  Physical therapy undergone in the past, declines today. 3 simple sutures successfully removed today.  No  issues. Refill levocetirizine.  This appears to be working well, okay to take daily as needed. The patient voiced understanding and agreement to the plan.  [place], DO 02/13/21  11:59 AM

## 2021-02-13 NOTE — Patient Instructions (Signed)

## 2021-02-14 ENCOUNTER — Other Ambulatory Visit: Payer: Self-pay | Admitting: Family Medicine

## 2021-02-14 DIAGNOSIS — L989 Disorder of the skin and subcutaneous tissue, unspecified: Secondary | ICD-10-CM

## 2021-02-14 MED ORDER — TRIAMCINOLONE ACETONIDE 0.1 % EX CREA: 1.0000 "application " | TOPICAL_CREAM | Freq: Two times a day (BID) | CUTANEOUS | 0 refills | Status: DC

## 2021-05-04 ENCOUNTER — Ambulatory Visit: Payer: 59 | Admitting: Family Medicine

## 2021-05-07 ENCOUNTER — Other Ambulatory Visit: Payer: Self-pay | Admitting: Family Medicine

## 2021-05-07 ENCOUNTER — Other Ambulatory Visit: Payer: Self-pay

## 2021-05-07 ENCOUNTER — Encounter: Payer: Self-pay | Admitting: Family Medicine

## 2021-05-07 ENCOUNTER — Ambulatory Visit (INDEPENDENT_AMBULATORY_CARE_PROVIDER_SITE_OTHER): Payer: 59 | Admitting: Family Medicine

## 2021-05-07 VITALS — BP 118/68 | HR 67 | Temp 98.1°F | Ht 67.0 in | Wt 159.0 lb

## 2021-05-07 DIAGNOSIS — M545 Low back pain, unspecified: Secondary | ICD-10-CM | POA: Diagnosis not present

## 2021-05-07 DIAGNOSIS — G8929 Other chronic pain: Secondary | ICD-10-CM

## 2021-05-07 DIAGNOSIS — I10 Essential (primary) hypertension: Secondary | ICD-10-CM

## 2021-05-07 DIAGNOSIS — I2510 Atherosclerotic heart disease of native coronary artery without angina pectoris: Secondary | ICD-10-CM

## 2021-05-07 MED ORDER — ATORVASTATIN CALCIUM 20 MG PO TABS: ORAL_TABLET | ORAL | 5 refills | Status: DC

## 2021-05-07 MED ORDER — METHYLPREDNISOLONE ACETATE 80 MG/ML IJ SUSP
80.0000 mg | Freq: Once | INTRAMUSCULAR | Status: AC
  Administered 2021-05-07: 80 mg via INTRAMUSCULAR

## 2021-05-07 NOTE — Patient Instructions (Signed)
Keep the diet clean and stay active.  Let us know if you need anything. 

## 2021-05-07 NOTE — Progress Notes (Signed)
Chief Complaint  Patient presents with   Follow-up    Subjective Joshua Thompson is a 75 y.o. male who presents for hypertension follow up. He does not monitor home blood pressures. He is compliant with medications - Norvasc 10 mg/d, Cardura 2 mg qhs, lisinopril 10 mg/d, Coreg 25 mg bid. Patient has these side effects of medication: none He is adhering to a healthy diet overall. Current exercise: walking No Cp or SOB.   Low back pain 4-5 years hx of chronic low back pain. Failed physical therapy, MRI showed spinal stenosis. IM steroid injection helps but wears. Some tingling in LE's, no weakness, bruising, swelling.    Past Medical History:  Diagnosis Date   Coronary artery disease    GERD (gastroesophageal reflux disease)    Hyperlipidemia    Hypertension     Exam BP 118/68   Pulse 67   Temp 98.1 F (36.7 C) (Oral)   Ht 5\' 7"  (1.702 m)   Wt 159 lb (72.1 kg)   SpO2 98%   BMI 24.90 kg/m  General:  well developed, well nourished, in no apparent distress Heart: RRR, no bruits, trace pitting b/l LE edema Lungs: clear to auscultation, no accessory muscle use MSK: No ttp Neuro: neg straight leg b/l, gait nml Psych: well oriented with normal range of affect and appropriate judgment/insight  Chronic bilateral low back pain, unspecified whether sciatica present - Plan: Ambulatory referral to Physical Medicine Rehab, methylPREDNISolone acetate (DEPO-MEDROL) injection 80 mg  Essential hypertension  Chronic, uncontrolled. Cont HEP. Refer PM&R as he may benefit from injections.  Chronic, stable. Cont meds as listed above. Counseled on diet and exercise. F/u in 6 mo for CPE or prn. The patient voiced understanding and agreement to the plan.  [place], DO 05/07/21  11:18 AM

## 2021-06-21 ENCOUNTER — Encounter: Payer: Self-pay | Admitting: Physical Medicine & Rehabilitation

## 2021-08-05 ENCOUNTER — Other Ambulatory Visit: Payer: Self-pay | Admitting: Family Medicine

## 2021-08-05 DIAGNOSIS — I10 Essential (primary) hypertension: Secondary | ICD-10-CM

## 2021-08-21 ENCOUNTER — Encounter: Payer: 59 | Admitting: Physical Medicine & Rehabilitation

## 2021-09-03 ENCOUNTER — Other Ambulatory Visit: Payer: Self-pay | Admitting: Family Medicine

## 2021-09-04 ENCOUNTER — Other Ambulatory Visit: Payer: Self-pay | Admitting: Interventional Cardiology

## 2021-09-10 ENCOUNTER — Encounter: Payer: Self-pay | Admitting: Family Medicine

## 2021-09-10 ENCOUNTER — Other Ambulatory Visit: Payer: Self-pay | Admitting: Family Medicine

## 2021-09-24 ENCOUNTER — Telehealth: Payer: Self-pay | Admitting: Family Medicine

## 2021-09-24 DIAGNOSIS — I10 Essential (primary) hypertension: Secondary | ICD-10-CM

## 2021-09-24 MED ORDER — CARVEDILOL 25 MG PO TABS: ORAL_TABLET | ORAL | 0 refills | Status: DC

## 2021-09-24 NOTE — Telephone Encounter (Signed)
Medication: carvedilol (COREG) 25 MG tablet  ? ?Has the patient contacted their pharmacy? Yes.   ? ? ?Preferred Pharmacy: [facility] [place], [address]  ?[place], [place] [address]  ?Phone:  [phone]  Fax:  [phone]  ? ? ?

## 2021-09-24 NOTE — Telephone Encounter (Signed)
sent 

## 2021-11-05 ENCOUNTER — Other Ambulatory Visit: Payer: Self-pay | Admitting: Family Medicine

## 2021-11-05 DIAGNOSIS — K219 Gastro-esophageal reflux disease without esophagitis: Secondary | ICD-10-CM

## 2021-11-06 ENCOUNTER — Other Ambulatory Visit: Payer: Self-pay | Admitting: Family Medicine

## 2021-11-06 DIAGNOSIS — I10 Essential (primary) hypertension: Secondary | ICD-10-CM

## 2021-11-06 DIAGNOSIS — I2510 Atherosclerotic heart disease of native coronary artery without angina pectoris: Secondary | ICD-10-CM

## 2021-11-06 MED ORDER — ATORVASTATIN CALCIUM 20 MG PO TABS: ORAL_TABLET | ORAL | 5 refills | Status: DC

## 2021-11-13 ENCOUNTER — Other Ambulatory Visit: Payer: Self-pay | Admitting: Family Medicine

## 2021-11-13 DIAGNOSIS — J302 Other seasonal allergic rhinitis: Secondary | ICD-10-CM

## 2021-11-13 MED ORDER — FLUTICASONE PROPIONATE 50 MCG/ACT NA SUSP: NASAL | 6 refills | Status: DC

## 2021-11-29 ENCOUNTER — Other Ambulatory Visit: Payer: Self-pay

## 2021-11-29 MED ORDER — ISOSORBIDE MONONITRATE ER 30 MG PO TB24: ORAL_TABLET | ORAL | 0 refills | Status: DC

## 2021-11-30 ENCOUNTER — Other Ambulatory Visit: Payer: Self-pay | Admitting: Family Medicine

## 2021-12-06 ENCOUNTER — Other Ambulatory Visit: Payer: Self-pay | Admitting: Family Medicine

## 2021-12-06 DIAGNOSIS — I10 Essential (primary) hypertension: Secondary | ICD-10-CM

## 2022-01-21 ENCOUNTER — Telehealth: Payer: Self-pay | Admitting: *Deleted

## 2022-01-21 ENCOUNTER — Ambulatory Visit (INDEPENDENT_AMBULATORY_CARE_PROVIDER_SITE_OTHER): Payer: Commercial Managed Care - HMO | Admitting: Family Medicine

## 2022-01-21 ENCOUNTER — Encounter: Payer: Self-pay | Admitting: Family Medicine

## 2022-01-21 VITALS — BP 138/62 | HR 69 | Temp 98.0°F | Ht 67.0 in | Wt 154.5 lb

## 2022-01-21 DIAGNOSIS — I25119 Atherosclerotic heart disease of native coronary artery with unspecified angina pectoris: Secondary | ICD-10-CM | POA: Diagnosis not present

## 2022-01-21 DIAGNOSIS — I10 Essential (primary) hypertension: Secondary | ICD-10-CM | POA: Diagnosis not present

## 2022-01-21 DIAGNOSIS — M1A9XX0 Chronic gout, unspecified, without tophus (tophi): Secondary | ICD-10-CM | POA: Diagnosis not present

## 2022-01-21 DIAGNOSIS — M545 Low back pain, unspecified: Secondary | ICD-10-CM | POA: Diagnosis not present

## 2022-01-21 LAB — COMPREHENSIVE METABOLIC PANEL
ALT: 10 U/L (ref 0–53)
AST: 14 U/L (ref 0–37)
Albumin: 4.5 g/dL (ref 3.5–5.2)
Alkaline Phosphatase: 70 U/L (ref 39–117)
BUN: 58 mg/dL — ABNORMAL HIGH (ref 6–23)
CO2: 23 mEq/L (ref 19–32)
Calcium: 9.1 mg/dL (ref 8.4–10.5)
Chloride: 107 mEq/L (ref 96–112)
Creatinine, Ser: 4.26 mg/dL — ABNORMAL HIGH (ref 0.40–1.50)
GFR: 13.03 mL/min — CL (ref >60.00)
Glucose, Bld: 94 mg/dL (ref 70–99)
Potassium: 4.8 mEq/L (ref 3.5–5.1)
Sodium: 139 mEq/L (ref 135–145)
Total Bilirubin: 0.5 mg/dL (ref 0.2–1.2)
Total Protein: 7.2 g/dL (ref 6.0–8.3)

## 2022-01-21 LAB — LIPID PANEL
Cholesterol: 137 mg/dL (ref 0–200)
HDL: 53.2 mg/dL (ref >39.00)
LDL Cholesterol: 72 mg/dL (ref 0–99)
NonHDL: 83.71
Total CHOL/HDL Ratio: 3
Triglycerides: 57 mg/dL (ref 0.0–149.0)
VLDL: 11.4 mg/dL (ref 0.0–40.0)

## 2022-01-21 LAB — URIC ACID: Uric Acid, Serum: 5.5 mg/dL (ref 4.0–7.8)

## 2022-01-21 MED ORDER — TIZANIDINE HCL 4 MG PO TABS: 4.0000 mg | ORAL_TABLET | Freq: Four times a day (QID) | ORAL | 0 refills | Status: DC | PRN

## 2022-01-21 MED ORDER — METHYLPREDNISOLONE ACETATE 80 MG/ML IJ SUSP
80.0000 mg | Freq: Once | INTRAMUSCULAR | Status: AC
  Administered 2022-01-21: 80 mg via INTRAMUSCULAR

## 2022-01-21 NOTE — Patient Instructions (Signed)
Give [place] 2-3 business days to get the results of your labs back.   Keep the diet clean and stay active.  Keep doing the stretches for your back.  Let us know if you need anything.

## 2022-01-21 NOTE — Telephone Encounter (Signed)
CRITICAL VALUE STICKER  CRITICAL VALUE: GFR low at 13.02  MESSENGER (representative from lab): [place]

## 2022-01-21 NOTE — Addendum Note (Signed)
Addended by: Sharon Seller B on: 01/21/2022 07:45 AM   Modules accepted: Orders

## 2022-01-21 NOTE — Progress Notes (Signed)
Chief Complaint  Patient presents with   Back Pain   Follow-up    Lab work    Subjective Joshua Thompson is a 76 y.o. male who presents for hypertension follow up. He does monitor home blood pressures. Blood pressures ranging from 130s/80's on average. He is compliant with medications- Coreg 25 mg bid, Norvasc 10 mg/d, hydralazine 100 mg TID, Cardura 2 mg qhs, lisinopril 10 mg/d. Patient has these side effects of medication: none He is adhering to a healthy diet overall. Current exercise: stretches No CP or current SOB.  Gout Hx of gout on allopurinol 150 mg/d. Affects L MTP. Gout flared 2 weeks back and tart cherry extract helped. CK ck'd his urate and it was elevated.   Hyperlipidemia Patient presents for dyslipidemia follow up. Currently being treated with Lipitor 20 mg/d and compliance with treatment thus far has been good. He denies myalgias. Diet/exercise as above.  The patient is known to have coexisting coronary artery disease.  LBP 3 d ago started to worsen. No inj or change in activity. Has trouble getting out of bed. Tx: cold/heat, rest, massage. No neuro s/s's, bowel/bladder incontinence, bruising, redness, swelling.    Past Medical History:  Diagnosis Date   Coronary artery disease    GERD (gastroesophageal reflux disease)    Hyperlipidemia    Hypertension     Exam BP 138/62 (BP Location: Left Arm, Cuff Size: Normal)   Pulse 69   Temp 98 F (36.7 C) (Oral)   Ht 5\' 7"  (1.702 m)   Wt 154 lb 8 oz (70.1 kg)   SpO2 98%   BMI 24.20 kg/m  General:  well developed, well nourished, in no apparent distress Heart: RRR, no bruits, 2+ pitting b/l LE edema tapering at prox tibia Lungs: clear to auscultation, no accessory muscle use MSK: TTP over lumbar parasp msc b/l and midline, decent hamstring ROM Neuro: Antalgic gait, DTR's equal/symmetric in LE's b/l, neg straight leg b/l Psych: well oriented with normal range of affect and appropriate  judgment/insight  Essential hypertension  Atherosclerosis of native coronary artery of native heart with angina pectoris (HCC) - Plan: Comprehensive metabolic panel, Lipid panel  Chronic gout involving toe without tophus, unspecified cause, unspecified laterality - Plan: Uric acid  Acute bilateral low back pain without sciatica - Plan: tiZANidine (ZANAFLEX) 4 MG tablet  Chronic, currently stable. Counseled on diet and exercise. Cont Cardura 2 mg qhs, hydralazine 100 mg TID, Coreg 25 mg bid, Norvasc 10 mg/d, lisinopril 10 mg/d.  Chronic, stable. Cont Lipitor 20 mg/d, baby aspirin. Chronic, unsure if stable. Ck labs. Cont allopurinol 150 mg/d for now.  Exacerbation of chronic issue. Depo injection today. Cont stretches, heat, ice, Tylenol. Zanaflex prn.   F/u in 6 mo. The patient voiced understanding and agreement to the plan.  [place], DO 01/21/22  7:37 AM

## 2022-01-21 NOTE — Telephone Encounter (Signed)
Noted, this is his baseline, he follows w nephro.

## 2022-02-04 ENCOUNTER — Other Ambulatory Visit: Payer: Self-pay | Admitting: Family Medicine

## 2022-02-04 DIAGNOSIS — I10 Essential (primary) hypertension: Secondary | ICD-10-CM

## 2022-02-11 NOTE — Progress Notes (Unsigned)
Cardiology Office Note   Date:  02/13/2022   ID:  Zaydin, Billey September 13, 1945, MRN [id number]  PCP:  Shelda Pal, DO    No chief complaint on file.  CAD  Wt Readings from Last 3 Encounters:  02/13/22 153 lb (69.4 kg)  01/21/22 154 lb 8 oz (70.1 kg)  05/07/21 159 lb (72.1 kg)       History of Present Illness: Joshua Thompson is a 76 y.o. male   who has had HTN for about 15 years.  In 2004, he had chest pain and then a stress test which was abnormal and then a DES (Taxus) was placed, while in [place].  He did well after that time.  Had heaviness in the chest prior while doing yoga prior to stent.      In 2010, he had leg pain and kidney pain.  He was gaining weight and feeling ill and he was retaining fluid.  His BP was high.  His creatinine had reached 1.5+.  He also had prostatic obstruction leading to the urinary issues.    In 09/2012, he had HTN, urine retention and then he had a catheter placed for his bladder.  TURP was recommended, but it was also noted that his BP was up.  Another coronary lesion was found.  He was in renal failure, Cr 4.5.  He then had two bare stents placed.  Two weeks later, he had a TURP which was unsuccessful.  He had a bladder obstruction.  He had a repeat TURP.    In August 2014, he and his wife  Moved to [place].  He followed up with urology at [facility].  It was noted that there was an irregularity of the surface of the inside of the bladder.  His Cr. Is 3.0 at baseline.    Nebivolol has worked better than Coreg in the past but was more expensive.  He now takes carvedilol BID and metoprolol for prn increased BP.   Hydralazine was recommended to be taken TID, but he did not take this.  Renal dysfunction has limited meds.    He had some sensation in his chest that resolved with an herbal medicine in 2018.   He had some high BP readings.  He continues to be compliant with his medications.  he has followed with urology and was  referred to nephrology for CRF.  GFR was 22 in 2019 per his report.   Increase of Carvedilol has helped BP.      He reported high BP readings recently.  He has had some shortness of breath as well. Systolics in the 481-856 range, typically at night.  He feels cold at night and starts shivering. Highest readings in AM are in the 150s.   Spironolactone was stopped due to hyperkalemia.     In 6/22: Normal stress test.  Normal LV function.   Has some chills in the evening.  BP spikes but then decreases.    Home BPs have been generally well controlled, 120-130 range.   Dr. Posey Pronto has been following renal function.  Cr was decreased to 3.5 in mid August, down from 4.3.  Dialysis intro is being considered.   Has some ankle edema.     Denies : Chest pain. Dizziness. Leg edema. Nitroglycerin use. Orthopnea- sleeps on two pillows. Palpitations. Paroxysmal nocturnal dyspnea. Shortness of breath. Syncope.     Past Medical History:  Diagnosis Date   Coronary artery disease    GERD (gastroesophageal reflux  disease)    Hyperlipidemia    Hypertension     Past Surgical History:  Procedure Laterality Date   CORONARY ANGIOPLASTY     3 STENTS   TRANSURETHRAL RESECTION OF PROSTATE     HAD DONE THREE TIMES     Current Outpatient Medications  Medication Sig Dispense Refill   allopurinol (ZYLOPRIM) 100 MG tablet TAKE 1 TABLET(100 MG) BY MOUTH DAILY 90 tablet 2   amLODipine (NORVASC) 10 MG tablet TAKE 1 TABLET(10 MG) BY MOUTH DAILY 90 tablet 2   atorvastatin (LIPITOR) 20 MG tablet TAKE 1 TABLET(20 MG) BY MOUTH DAILY 30 tablet 5   carvedilol (COREG) 25 MG tablet TAKE 1 TABLET(25 MG) BY MOUTH TWICE DAILY WITH A MEAL 180 tablet 0   doxazosin (CARDURA) 2 MG tablet TAKE 1 TABLET BY MOUTH AT BEDTIME 90 tablet 2   fluticasone (FLONASE) 50 MCG/ACT nasal spray SHAKE LIQUID AND USE 2 SPRAYS IN EACH NOSTRIL DAILY 16 g 6   furosemide (LASIX) 40 MG tablet Take 1 tab twice daily. 30 tablet 3   hydrALAZINE  (APRESOLINE) 50 MG tablet TAKE 2 TABLETS BY MOUTH THREE TIMES DAILY 180 tablet 2   isosorbide mononitrate (IMDUR) 30 MG 24 hr tablet TAKE 1 TABLET(30 MG) BY MOUTH DAILY 90 tablet 0   levocetirizine (XYZAL) 5 MG tablet Take 1 tablet (5 mg total) by mouth every evening. 90 tablet 3   nitroGLYCERIN (NITROSTAT) 0.4 MG SL tablet Place 1 tablet (0.4 mg total) under the tongue every 5 (five) minutes as needed for chest pain. 25 tablet 5   RABEprazole (ACIPHEX) 20 MG tablet TAKE 1 TABLET(20 MG) BY MOUTH DAILY 90 tablet 3   tiZANidine (ZANAFLEX) 4 MG tablet Take 1 tablet (4 mg total) by mouth every 6 (six) hours as needed for muscle spasms. 30 tablet 0   triamcinolone cream (KENALOG) 0.1 % Apply 1 application topically 2 (two) times daily. 30 g 0   No current facility-administered medications for this visit.    Allergies:   Amoxicillin-pot clavulanate and Ampicillin    Social History:  The patient  reports that he has never smoked. He has never used smokeless tobacco. He reports that he does not drink alcohol and does not use drugs.   Family History:  The patient's family history includes Hypertension in his mother; Stroke in his maternal grandfather.    ROS:  Please see the history of present illness.   Otherwise, review of systems are positive for DOE.   All other systems are reviewed and negative.    PHYSICAL EXAM: VS:  BP 130/60 (BP Location: Left Arm, Patient Position: Sitting, Cuff Size: Normal)   Pulse 66   Ht 5\' 7"  (1.702 m)   Wt 153 lb (69.4 kg)   BMI 23.96 kg/m  , BMI Body mass index is 23.96 kg/m. GEN: Well nourished, well developed, in no acute distress HEENT: normal Neck: no JVD, carotid bruits, or masses Cardiac: RRR; no murmurs, rubs, or gallops,; tr ankle edema  Respiratory:  clear to auscultation bilaterally, normal work of breathing GI: soft, nontender, nondistended, + BS MS: no deformity or atrophy Skin: warm and dry, no rash Neuro:  Strength and sensation are  intact Psych: euthymic mood, full affect   EKG:   The ekg ordered today demonstrates NSR, lead III Q wave, no ST segment changes   Recent Labs: 01/21/2022: ALT 10; BUN 58; Creatinine, Ser 4.26; Potassium 4.8; Sodium 139   Lipid Panel    Component Value Date/Time  CHOL 137 01/21/2022 0738   CHOL 138 07/26/2019 0933   TRIG 57.0 01/21/2022 0738   HDL 53.20 01/21/2022 0738   HDL 55 07/26/2019 0933   CHOLHDL 3 01/21/2022 0738   VLDL 11.4 01/21/2022 0738   LDLCALC 72 01/21/2022 0738   LDLCALC 70 07/26/2019 0933     Other studies Reviewed: Additional studies/ records that were reviewed today with results demonstrating: labs reviewed as noted above   ASSESSMENT AND PLAN:  CAD: Continue aggressive secondary prevention.  No angina on medical therapy.  Stress test negative in 2022.  Check echo for DOE.  No obvious heart failure on exam.  Echo to be done after he returns from his [place] trip in October.  He feels fine at rest.  It is only after walking a distance that symptoms begin. HTN: Well-controlled today.  Low-salt diet.  Avoid processed foods.  Occasional spikes in the evening.  Self-limiting. CKD stage IV: Avoid nephrotoxins.  Stay well-hydrated. PAD: RIght subclavian disease. No sx.  Hyperlipidemia: LDL 53 in August 2023.  Continue atorvastatin.  Whole food, plant-based diet recommended.  High-fiber diet.   Current medicines are reviewed at length with the patient today.  The patient concerns regarding his medicines were addressed.  The following changes have been made:  No change  Labs/ tests ordered today include: echo No orders of the defined types were placed in this encounter.   Recommend 150 minutes/week of aerobic exercise Low fat, low carb, high fiber diet recommended  Disposition:   FU in 6 months   Signed, Larae Grooms, MD  02/13/2022 8:42 AM    [facility] [place], [place], [place]  [address] Phone: [phone]; Fax:  [phone]

## 2022-02-11 NOTE — Progress Notes (Incomplete)
Cardiology Office Note   Date:  02/11/2022   ID:  Chipper, [name] [birth year], MRN [id number]  PCP:  Shelda Pal, DO    No chief complaint on file.    Wt Readings from Last 3 Encounters:  01/21/22 154 lb 8 oz (70.1 kg)  05/07/21 159 lb (72.1 kg)  02/13/21 161 lb 4 oz (73.1 kg)       History of Present Illness: Joshua Thompson is a 76 y.o. male  ***    Past Medical History:  Diagnosis Date  . Coronary artery disease   . GERD (gastroesophageal reflux disease)   . Hyperlipidemia   . Hypertension     Past Surgical History:  Procedure Laterality Date  . CORONARY ANGIOPLASTY     3 STENTS  . TRANSURETHRAL RESECTION OF PROSTATE     HAD DONE THREE TIMES     Current Outpatient Medications  Medication Sig Dispense Refill  . allopurinol (ZYLOPRIM) 100 MG tablet TAKE 1 TABLET(100 MG) BY MOUTH DAILY 90 tablet 2  . amLODipine (NORVASC) 10 MG tablet TAKE 1 TABLET(10 MG) BY MOUTH DAILY 90 tablet 2  . atorvastatin (LIPITOR) 20 MG tablet TAKE 1 TABLET(20 MG) BY MOUTH DAILY 30 tablet 5  . carvedilol (COREG) 25 MG tablet TAKE 1 TABLET(25 MG) BY MOUTH TWICE DAILY WITH A MEAL 180 tablet 0  . doxazosin (CARDURA) 2 MG tablet TAKE 1 TABLET BY MOUTH AT BEDTIME 90 tablet 2  . fluticasone (FLONASE) 50 MCG/ACT nasal spray SHAKE LIQUID AND USE 2 SPRAYS IN EACH NOSTRIL DAILY 16 g 6  . furosemide (LASIX) 40 MG tablet Take 1 tab twice daily. 30 tablet 3  . hydrALAZINE (APRESOLINE) 50 MG tablet TAKE 2 TABLETS BY MOUTH THREE TIMES DAILY 180 tablet 2  . isosorbide mononitrate (IMDUR) 30 MG 24 hr tablet TAKE 1 TABLET(30 MG) BY MOUTH DAILY 90 tablet 0  . levocetirizine (XYZAL) 5 MG tablet Take 1 tablet (5 mg total) by mouth every evening. 90 tablet 3  . lisinopril (ZESTRIL) 10 MG tablet TAKE 1 TABLET(10 MG) BY MOUTH DAILY 30 tablet 3  . nitroGLYCERIN (NITROSTAT) 0.4 MG SL tablet Place 1 tablet (0.4 mg total) under the tongue every 5 (five) minutes as needed for chest pain.  25 tablet 5  . RABEprazole (ACIPHEX) 20 MG tablet TAKE 1 TABLET(20 MG) BY MOUTH DAILY 90 tablet 3  . tiZANidine (ZANAFLEX) 4 MG tablet Take 1 tablet (4 mg total) by mouth every 6 (six) hours as needed for muscle spasms. 30 tablet 0  . triamcinolone cream (KENALOG) 0.1 % Apply 1 application topically 2 (two) times daily. 30 g 0   No current facility-administered medications for this visit.    Allergies:   Amoxicillin-pot clavulanate and Ampicillin    Social History:  The patient  reports that he has never smoked. He has never used smokeless tobacco. He reports that he does not drink alcohol and does not use drugs.   Family History:  The patient's ***family history includes Hypertension in his mother; Stroke in his maternal grandfather.    ROS:  Please see the history of present illness.   Otherwise, review of systems are positive for ***.   All other systems are reviewed and negative.    PHYSICAL EXAM: VS:  There were no vitals taken for this visit. , BMI There is no height or weight on file to calculate BMI. GEN: Well nourished, well developed, in no acute distress HEENT: normal Neck: no JVD, carotid bruits,  or masses Cardiac: ***RRR; no murmurs, rubs, or gallops,no edema  Respiratory:  clear to auscultation bilaterally, normal work of breathing GI: soft, nontender, nondistended, + BS MS: no deformity or atrophy Skin: warm and dry, no rash Neuro:  Strength and sensation are intact Psych: euthymic mood, full affect   EKG:   The ekg ordered today demonstrates ***   Recent Labs: 01/21/2022: ALT 10; BUN 58; Creatinine, Ser 4.26; Potassium 4.8; Sodium 139   Lipid Panel    Component Value Date/Time   CHOL 137 01/21/2022 0738   CHOL 138 07/26/2019 0933   TRIG 57.0 01/21/2022 0738   HDL 53.20 01/21/2022 0738   HDL 55 07/26/2019 0933   CHOLHDL 3 01/21/2022 0738   VLDL 11.4 01/21/2022 0738   LDLCALC 72 01/21/2022 0738   LDLCALC 70 07/26/2019 0933     Other studies  Reviewed: Additional studies/ records that were reviewed today with results demonstrating: ***.   ASSESSMENT AND PLAN:  ***  *** ***   Current medicines are reviewed at length with the patient today.  The patient concerns regarding his medicines were addressed.  The following changes have been made:  No change***  Labs/ tests ordered today include: *** No orders of the defined types were placed in this encounter.   Recommend 150 minutes/week of aerobic exercise Low fat, low carb, high fiber diet recommended  Disposition:   FU in ***   Signed, Larae Grooms, MD  02/11/2022 11:51 PM    [facility] [place], [place], [place]  [address] Phone: [phone]; Fax: [phone]

## 2022-02-13 ENCOUNTER — Encounter: Payer: Self-pay | Admitting: Interventional Cardiology

## 2022-02-13 ENCOUNTER — Ambulatory Visit: Payer: Commercial Managed Care - HMO | Attending: Interventional Cardiology | Admitting: Interventional Cardiology

## 2022-02-13 VITALS — BP 130/60 | HR 66 | Ht 67.0 in | Wt 153.0 lb

## 2022-02-13 DIAGNOSIS — I25119 Atherosclerotic heart disease of native coronary artery with unspecified angina pectoris: Secondary | ICD-10-CM | POA: Diagnosis not present

## 2022-02-13 DIAGNOSIS — I10 Essential (primary) hypertension: Secondary | ICD-10-CM

## 2022-02-13 DIAGNOSIS — N184 Chronic kidney disease, stage 4 (severe): Secondary | ICD-10-CM

## 2022-02-13 DIAGNOSIS — E782 Mixed hyperlipidemia: Secondary | ICD-10-CM

## 2022-02-13 NOTE — Patient Instructions (Signed)
Medication Instructions:  Your physician recommends that you continue on your current medications as directed. Please refer to the Current Medication list given to you today.  *If you need a refill on your cardiac medications before your next appointment, please call your pharmacy*   Lab Work: none If you have labs (blood work) drawn today and your tests are completely normal, you will receive your results only by: Rachel (if you have MyChart) OR A paper copy in the mail If you have any lab test that is abnormal or we need to change your treatment, we will call you to review the results.   Testing/Procedures: Your physician has requested that you have an echocardiogram. Echocardiography is a painless test that uses sound waves to create images of your heart. It provides your doctor with information about the size and shape of your heart and how well your heart's chambers and valves are working. This procedure takes approximately one hour. There are no restrictions for this procedure. OK to do in October when patient returns from trip   Follow-Up: At [facility], you and your health needs are our priority.  As part of our continuing mission to provide you with exceptional heart care, we have created designated Provider Care Teams.  These Care Teams include your primary Cardiologist (physician) and Advanced Practice Providers (APPs -  Physician Assistants and Nurse Practitioners) who all work together to provide you with the care you need, when you need it.  We recommend signing up for the patient portal called "MyChart".  Sign up information is provided on this After Visit Summary.  MyChart is used to connect with patients for Virtual Visits (Telemedicine).  Patients are able to view lab/test results, encounter notes, upcoming appointments, etc.  Non-urgent messages can be sent to your provider as well.   To learn more about what you can do with MyChart, go to  [email or web address].    Your next appointment:   6 month(s)  The format for your next appointment:   In Person  Provider:   Larae Grooms, MD     Other Instructions    Important Information About Sugar

## 2022-03-06 ENCOUNTER — Other Ambulatory Visit: Payer: Self-pay | Admitting: Family Medicine

## 2022-03-06 DIAGNOSIS — I10 Essential (primary) hypertension: Secondary | ICD-10-CM

## 2022-03-21 ENCOUNTER — Other Ambulatory Visit: Payer: Self-pay | Admitting: Interventional Cardiology

## 2022-03-26 ENCOUNTER — Ambulatory Visit (HOSPITAL_COMMUNITY): Payer: Commercial Managed Care - HMO | Attending: Cardiology

## 2022-03-26 DIAGNOSIS — I340 Nonrheumatic mitral (valve) insufficiency: Secondary | ICD-10-CM | POA: Diagnosis not present

## 2022-03-26 DIAGNOSIS — I25119 Atherosclerotic heart disease of native coronary artery with unspecified angina pectoris: Secondary | ICD-10-CM | POA: Diagnosis present

## 2022-03-26 DIAGNOSIS — I517 Cardiomegaly: Secondary | ICD-10-CM

## 2022-03-26 LAB — ECHOCARDIOGRAM COMPLETE
AR max vel: 2.36 cm2
AV Area VTI: 2.5 cm2
AV Area mean vel: 2.38 cm2
AV Mean grad: 11 mmHg
AV Peak grad: 19.4 mmHg
Ao pk vel: 2.2 m/s
Area-P 1/2: 3.42 cm2
P 1/2 time: 292 msec
S' Lateral: 2.9 cm

## 2022-04-22 ENCOUNTER — Other Ambulatory Visit: Payer: Self-pay | Admitting: Family Medicine

## 2022-04-22 DIAGNOSIS — J302 Other seasonal allergic rhinitis: Secondary | ICD-10-CM

## 2022-04-22 MED ORDER — LEVOCETIRIZINE DIHYDROCHLORIDE 5 MG PO TABS
5.0000 mg | ORAL_TABLET | Freq: Every evening | ORAL | 3 refills | Status: DC
  Filled 2022-07-18: qty 30, 30d supply, fill #0
  Filled 2022-08-21: qty 30, 30d supply, fill #1
  Filled 2022-09-23 (×2): qty 30, 30d supply, fill #2
  Filled 2022-10-02 – 2022-10-21 (×2): qty 30, 30d supply, fill #3
  Filled 2022-10-31 – 2022-11-22 (×2): qty 30, 30d supply, fill #4
  Filled 2022-12-23: qty 30, 30d supply, fill #5
  Filled 2023-01-21: qty 30, 30d supply, fill #6
  Filled 2023-02-17: qty 30, 30d supply, fill #7
  Filled 2023-03-19: qty 30, 30d supply, fill #8

## 2022-05-06 ENCOUNTER — Other Ambulatory Visit: Payer: Self-pay

## 2022-05-06 MED ORDER — NITROGLYCERIN 0.4 MG SL SUBL: 0.4000 mg | SUBLINGUAL_TABLET | SUBLINGUAL | 9 refills | Status: DC | PRN

## 2022-05-06 NOTE — Telephone Encounter (Signed)
Pt's medication was sent to pt's pharmacy as requested. Confirmation received.  °

## 2022-05-14 ENCOUNTER — Other Ambulatory Visit: Payer: Self-pay | Admitting: Family Medicine

## 2022-05-14 DIAGNOSIS — I10 Essential (primary) hypertension: Secondary | ICD-10-CM

## 2022-05-14 DIAGNOSIS — I2510 Atherosclerotic heart disease of native coronary artery without angina pectoris: Secondary | ICD-10-CM

## 2022-06-14 ENCOUNTER — Other Ambulatory Visit: Payer: Self-pay | Admitting: Family Medicine

## 2022-06-18 ENCOUNTER — Other Ambulatory Visit: Payer: Self-pay | Admitting: Family Medicine

## 2022-06-18 DIAGNOSIS — J302 Other seasonal allergic rhinitis: Secondary | ICD-10-CM

## 2022-06-18 MED ORDER — FLUTICASONE PROPIONATE 50 MCG/ACT NA SUSP
2.0000 | Freq: Every day | NASAL | 6 refills | Status: AC
  Filled 2022-07-18: qty 16, 30d supply, fill #0
  Filled 2022-08-21: qty 16, 30d supply, fill #1
  Filled 2022-09-23 (×2): qty 16, 30d supply, fill #2
  Filled 2022-10-02 – 2023-01-24 (×4): qty 16, 30d supply, fill #3
  Filled 2023-03-19: qty 16, 30d supply, fill #4

## 2022-07-10 ENCOUNTER — Other Ambulatory Visit (HOSPITAL_BASED_OUTPATIENT_CLINIC_OR_DEPARTMENT_OTHER): Payer: Self-pay

## 2022-07-11 ENCOUNTER — Other Ambulatory Visit: Payer: Self-pay | Admitting: Family Medicine

## 2022-07-11 ENCOUNTER — Other Ambulatory Visit (HOSPITAL_BASED_OUTPATIENT_CLINIC_OR_DEPARTMENT_OTHER): Payer: Self-pay

## 2022-07-11 ENCOUNTER — Other Ambulatory Visit: Payer: Self-pay | Admitting: Interventional Cardiology

## 2022-07-11 DIAGNOSIS — I2510 Atherosclerotic heart disease of native coronary artery without angina pectoris: Secondary | ICD-10-CM

## 2022-07-11 DIAGNOSIS — I10 Essential (primary) hypertension: Secondary | ICD-10-CM

## 2022-07-11 MED ORDER — HYDRALAZINE HCL 50 MG PO TABS
100.0000 mg | ORAL_TABLET | Freq: Three times a day (TID) | ORAL | 2 refills | Status: DC
  Filled 2022-07-11 – 2022-07-18 (×2): qty 180, 30d supply, fill #0
  Filled 2022-08-21: qty 180, 30d supply, fill #1
  Filled 2022-09-23 (×2): qty 180, 30d supply, fill #2

## 2022-07-11 MED ORDER — ATORVASTATIN CALCIUM 20 MG PO TABS
20.0000 mg | ORAL_TABLET | Freq: Every day | ORAL | 5 refills | Status: DC
  Filled 2022-07-11: qty 30, fill #0
  Filled 2022-07-18: qty 30, 30d supply, fill #0
  Filled 2022-08-21: qty 30, 30d supply, fill #1
  Filled 2022-09-23 (×2): qty 30, 30d supply, fill #2
  Filled 2022-10-02 – 2022-10-21 (×2): qty 30, 30d supply, fill #3
  Filled 2022-10-31 – 2022-11-22 (×2): qty 30, 30d supply, fill #4
  Filled 2022-12-23: qty 30, 30d supply, fill #5

## 2022-07-11 MED ORDER — ALLOPURINOL 100 MG PO TABS
200.0000 mg | ORAL_TABLET | Freq: Every day | ORAL | 3 refills | Status: DC
  Filled 2022-07-18: qty 180, 90d supply, fill #0
  Filled 2022-08-21 – 2022-10-02 (×5): qty 180, 90d supply, fill #1

## 2022-07-11 MED ORDER — CARVEDILOL 25 MG PO TABS
25.0000 mg | ORAL_TABLET | Freq: Two times a day (BID) | ORAL | 1 refills | Status: DC
  Filled 2022-07-11: qty 180, fill #0
  Filled 2022-07-18: qty 180, 90d supply, fill #0
  Filled 2022-08-21 (×2): qty 180, 90d supply, fill #1

## 2022-07-11 MED ORDER — FUROSEMIDE 40 MG PO TABS
40.0000 mg | ORAL_TABLET | Freq: Every day | ORAL | 3 refills | Status: DC
  Filled 2022-07-11: qty 30, fill #0
  Filled 2022-07-18: qty 30, 30d supply, fill #0
  Filled 2022-09-23 – 2022-10-31 (×4): qty 30, 30d supply, fill #1

## 2022-07-11 MED ORDER — NITROGLYCERIN 0.4 MG SL SUBL
0.4000 mg | SUBLINGUAL_TABLET | SUBLINGUAL | 3 refills | Status: DC | PRN
  Filled 2022-07-11: qty 25, 1d supply, fill #0
  Filled 2022-07-18: qty 25, 5d supply, fill #0
  Filled 2022-08-21: qty 25, 5d supply, fill #1
  Filled 2022-10-31: qty 25, 5d supply, fill #2

## 2022-07-11 MED ORDER — DOXAZOSIN MESYLATE 2 MG PO TABS
2.0000 mg | ORAL_TABLET | Freq: Every day | ORAL | 1 refills | Status: DC
  Filled 2022-07-11 – 2022-07-18 (×2): qty 90, 90d supply, fill #0
  Filled 2022-09-23 – 2022-10-02 (×3): qty 90, 90d supply, fill #1

## 2022-07-18 MED FILL — Amlodipine Besylate Tab 10 MG (Base Equivalent): ORAL | 90 days supply | Qty: 90 | Fill #0 | Status: AC

## 2022-07-18 MED FILL — Isosorbide Mononitrate Tab ER 24HR 30 MG: ORAL | 90 days supply | Qty: 90 | Fill #0 | Status: AC

## 2022-07-18 MED FILL — Rabeprazole Sodium EC Tab 20 MG: ORAL | 76 days supply | Qty: 76 | Fill #0 | Status: AC

## 2022-07-19 ENCOUNTER — Telehealth: Payer: Self-pay

## 2022-07-19 ENCOUNTER — Other Ambulatory Visit (HOSPITAL_BASED_OUTPATIENT_CLINIC_OR_DEPARTMENT_OTHER): Payer: Self-pay

## 2022-07-19 NOTE — Telephone Encounter (Signed)
PA initiated via Covermymeds; KEY: [id number]. Awaiting determination.

## 2022-07-19 NOTE — Telephone Encounter (Signed)
Called the patient informed of approval 

## 2022-07-19 NOTE — Telephone Encounter (Signed)
PA approved. Effective 07/19/2022 - 07/20/2023

## 2022-07-24 ENCOUNTER — Encounter: Payer: Self-pay | Admitting: Family Medicine

## 2022-07-24 ENCOUNTER — Ambulatory Visit: Payer: 59 | Admitting: Family Medicine

## 2022-07-24 ENCOUNTER — Other Ambulatory Visit (HOSPITAL_BASED_OUTPATIENT_CLINIC_OR_DEPARTMENT_OTHER): Payer: Self-pay

## 2022-07-24 VITALS — BP 134/66 | HR 74 | Temp 97.6°F | Ht 67.0 in | Wt 159.4 lb

## 2022-07-24 DIAGNOSIS — N184 Chronic kidney disease, stage 4 (severe): Secondary | ICD-10-CM

## 2022-07-24 DIAGNOSIS — B360 Pityriasis versicolor: Secondary | ICD-10-CM

## 2022-07-24 DIAGNOSIS — Z0000 Encounter for general adult medical examination without abnormal findings: Secondary | ICD-10-CM

## 2022-07-24 DIAGNOSIS — M545 Low back pain, unspecified: Secondary | ICD-10-CM | POA: Diagnosis not present

## 2022-07-24 DIAGNOSIS — G8929 Other chronic pain: Secondary | ICD-10-CM

## 2022-07-24 DIAGNOSIS — M1A9XX0 Chronic gout, unspecified, without tophus (tophi): Secondary | ICD-10-CM

## 2022-07-24 DIAGNOSIS — Z9849 Cataract extraction status, unspecified eye: Secondary | ICD-10-CM

## 2022-07-24 LAB — COMPREHENSIVE METABOLIC PANEL
ALT: 10 U/L (ref 0–53)
AST: 14 U/L (ref 0–37)
Albumin: 4.5 g/dL (ref 3.5–5.2)
Alkaline Phosphatase: 74 U/L (ref 39–117)
BUN: 55 mg/dL — ABNORMAL HIGH (ref 6–23)
CO2: 23 mEq/L (ref 19–32)
Calcium: 9 mg/dL (ref 8.4–10.5)
Chloride: 108 mEq/L (ref 96–112)
Creatinine, Ser: 3.58 mg/dL — ABNORMAL HIGH (ref 0.40–1.50)
GFR: 15.99 mL/min — ABNORMAL LOW (ref >60.00)
Glucose, Bld: 104 mg/dL — ABNORMAL HIGH (ref 70–99)
Potassium: 4.7 mEq/L (ref 3.5–5.1)
Sodium: 139 mEq/L (ref 135–145)
Total Bilirubin: 0.6 mg/dL (ref 0.2–1.2)
Total Protein: 7.2 g/dL (ref 6.0–8.3)

## 2022-07-24 LAB — LIPID PANEL
Cholesterol: 134 mg/dL (ref 0–200)
HDL: 49.2 mg/dL (ref >39.00)
LDL Cholesterol: 68 mg/dL (ref 0–99)
NonHDL: 84.45
Total CHOL/HDL Ratio: 3
Triglycerides: 84 mg/dL (ref 0.0–149.0)
VLDL: 16.8 mg/dL (ref 0.0–40.0)

## 2022-07-24 LAB — CBC
HCT: 34.5 % — ABNORMAL LOW (ref 39.0–52.0)
Hemoglobin: 11.2 g/dL — ABNORMAL LOW (ref 13.0–17.0)
MCHC: 32.5 g/dL (ref 30.0–36.0)
MCV: 99.1 fl (ref 78.0–100.0)
Platelets: 219 10*3/uL (ref 150.0–400.0)
RBC: 3.48 Mil/uL — ABNORMAL LOW (ref 4.22–5.81)
RDW: 14.6 % (ref 11.5–15.5)
WBC: 5.6 10*3/uL (ref 4.0–10.5)

## 2022-07-24 LAB — URIC ACID: Uric Acid, Serum: 5.3 mg/dL (ref 4.0–7.8)

## 2022-07-24 MED ORDER — KETOCONAZOLE 2 % EX CREA
1.0000 | TOPICAL_CREAM | Freq: Every day | CUTANEOUS | 0 refills | Status: DC
  Filled 2022-07-24: qty 30, 30d supply, fill #0

## 2022-07-24 MED ORDER — METHYLPREDNISOLONE ACETATE 80 MG/ML IJ SUSP
80.0000 mg | Freq: Once | INTRAMUSCULAR | Status: AC
  Administered 2022-07-24: 80 mg via INTRAMUSCULAR

## 2022-07-24 NOTE — Progress Notes (Signed)
Chief Complaint  Patient presents with   Annual Exam    Back pain and left knee pain     Well Male Joshua Thompson is here for a complete physical.   His last physical was >1 year ago.  Current diet: in general, a "healthy" diet.   Current exercise: walking Weight trend: stable Fatigue out of ordinary? No. Seat belt? Yes.   Advanced directive? No  Health maintenance Shingrix- Yes Colonoscopy- Yes Tetanus- Yes Hep C- Yes Pneumonia vaccine- Yes  Chronic low back pain- continues, worse in the AM. He was referred to chiropractic and PT. Never went to former, PT was too expensive. No recent inj or change in activity. It is getting worse over past 2-3 weeks. No loss of bowel/bladder function, neuro s/s's, bruising, redness, swelling. [place] was [place] PM&R but [place] and [place] went.   Patient reports scaly patches all over his body that are itchy.  No new lotions, soaps, topicals, or detergents.  There is no pain or drainage.  No close contacts with similar symptoms.  He denies any fevers.  Past Medical History:  Diagnosis Date   Coronary artery disease    GERD (gastroesophageal reflux disease)    Hyperlipidemia    Hypertension      Past Surgical History:  Procedure Laterality Date   CORONARY ANGIOPLASTY     3 STENTS   TRANSURETHRAL RESECTION OF PROSTATE     HAD DONE THREE TIMES    Medications  Current Outpatient Medications on File Prior to Visit  Medication Sig Dispense Refill   allopurinol (ZYLOPRIM) 100 MG tablet TAKE 1 TABLET(100 MG) BY MOUTH DAILY 90 tablet 2   allopurinol (ZYLOPRIM) 100 MG tablet Take 2 tablets (200 mg total) by mouth daily. 180 tablet 3   amLODipine (NORVASC) 10 MG tablet Take 1 tablet (10 mg total) by mouth daily. 90 tablet 2   atorvastatin (LIPITOR) 20 MG tablet Take 1 tablet (20 mg total) by mouth daily. 30 tablet 5   carvedilol (COREG) 25 MG tablet Take 1 tablet (25 mg total) by mouth 2 (two) times daily with a meal. 180  tablet 1   doxazosin (CARDURA) 2 MG tablet Take 1 tablet (2 mg total) by mouth at bedtime. 90 tablet 1   fluticasone (FLONASE) 50 MCG/ACT nasal spray Shake bottle and place 2 sprays into both nostrils daily. 16 g 6   furosemide (LASIX) 40 MG tablet Take 1 tablet (40 mg total) by mouth daily. 30 tablet 3   hydrALAZINE (APRESOLINE) 50 MG tablet Take 2 tablets (100 mg total) by mouth 3 (three) times daily. 180 tablet 2   isosorbide mononitrate (IMDUR) 30 MG 24 hr tablet Take 1 tablet (30 mg total) by mouth daily. 90 tablet 3   levocetirizine (XYZAL) 5 MG tablet Take 1 tablet (5 mg total) by mouth every evening. 90 tablet 3   nitroGLYCERIN (NITROSTAT) 0.4 MG SL tablet Place 1 tablet (0.4 mg total) under the tongue every 5 (five) minutes as needed for chest pain. 25 tablet 3   RABEprazole (ACIPHEX) 20 MG tablet Take 1 tablet (20 mg total) by mouth daily. 90 tablet 3   Allergies Allergies  Allergen Reactions   Amoxicillin-Pot Clavulanate Rash   Ampicillin Rash    Family History Family History  Problem Relation Age of Onset   Hypertension Mother    Stroke Maternal Grandfather    Arrhythmia Neg Hx    Heart attack Neg Hx     Review of  Systems: Constitutional:  no fevers Eye:  no recent significant change in vision Ears:  No changes in hearing Nose/Mouth/Throat:  no complaints of nasal congestion, no sore throat Cardiovascular: no chest pain Respiratory:  No shortness of breath Gastrointestinal:  No change in bowel habits GU:  No frequency Integumentary:  + Itchy patches of skin Neurologic:  no headaches Endocrine:  denies unexplained weight changes  Exam BP 134/66 (BP Location: Left Arm, Cuff Size: Normal)   Pulse 74   Temp 97.6 F (36.4 C) (Oral)   Ht 5\' 7"  (1.702 m)   Wt 159 lb 6 oz (72.3 kg)   SpO2 99%   BMI 24.96 kg/m  General:  well developed, well nourished, in no apparent distress Skin: Scaly macules and patches over the extremities, otherwise no significant moles,  warts, or growths Head:  no masses, lesions, or tenderness Eyes:  pupils equal and round, sclera anicteric without injection Ears:  canals without lesions, TMs shiny without retraction, no obvious effusion, no erythema Nose:  nares patent, mucosa normal Throat/Pharynx:  lips and gingiva without lesion; tongue and uvula midline; non-inflamed pharynx; no exudates or postnasal drainage Lungs:  clear to auscultation, breath sounds equal bilaterally, no respiratory distress Cardio:  regular rate and rhythm, no LE edema or bruits Rectal: Deferred GI: BS+, S, NT, ND, no masses or organomegaly Musculoskeletal:  symmetrical muscle groups noted without atrophy or deformity Neuro:  gait normal; deep tendon reflexes normal and symmetric Psych: well oriented with normal range of affect and appropriate judgment/insight  Assessment and Plan  Well adult exam - Plan: CBC, Comprehensive metabolic panel, Lipid panel  Chronic gout involving toe without tophus, unspecified cause, unspecified laterality - Plan: Uric acid  CKD (chronic kidney disease) stage 4, GFR 15-29 ml/min (HCC) - Plan: Ambulatory referral to Nephrology  Chronic bilateral low back pain without sciatica - Plan: Ambulatory referral to Physical Medicine Rehab, methylPREDNISolone acetate (DEPO-MEDROL) injection 80 mg  History of cataract extraction, unspecified laterality - Plan: Ambulatory referral to Ophthalmology  Tinea versicolor - Plan: ketoconazole (NIZORAL) 2 % cream   Well 77 y.o. male. Counseled on diet and exercise. Advanced directive form provided today.  Back pain: Chronic, exacerbation. Depo 80 mg IM today. Refer PM&R. Had bad experience w PT. Stretches/exercises provided today.  Heat, ice, Tylenol. Other orders as above. Tinea versicolor: Will send in ketoconazole cream to use once daily for the next couple weeks.  Consider biopsy if no improvement.  Nonscented emollients recommended as well.  Try not to scratch. Follow up  in 6 mo or prn.  The patient voiced understanding and agreement to the plan.  [place], DO 07/24/22 8:00 AM

## 2022-07-24 NOTE — Patient Instructions (Addendum)
Give [place] 2-3 business days to get the results of your labs back.   Keep the diet clean and stay active.  Please get me a copy of your advanced directive form at your convenience.   If you do not hear anything about your referrals in the next 1-2 weeks, call our office and ask for an update.  Let us know if you need anything.  Knee Exercises It is normal to feel mild stretching, pulling, tightness, or discomfort as you do these exercises, but you should stop right away if you feel sudden pain or your pain gets worse.  STRETCHING AND RANGE OF MOTION EXERCISES  These exercises warm up your muscles and joints and improve the movement and flexibility of your knee. These exercises also help to relieve pain, numbness, and tingling. Exercise A: Knee Extension, Prone  Lie on your abdomen on a bed. Place your left / right knee just beyond the edge of the surface so your knee is not on the bed. You can put a towel under your left / right thigh just above your knee for comfort. Relax your leg muscles and allow gravity to straighten your knee. You should feel a stretch behind your left / right knee. Hold this position for 30 seconds. Scoot up so your knee is supported between repetitions. Repeat 2 times. Complete this stretch 3 times per week. Exercise B: Knee Flexion, Active     Lie on your back with both knees straight. If this causes back discomfort, bend your left / right knee so your foot is flat on the floor. Slowly slide your left / right heel back toward your buttocks until you feel a gentle stretch in the front of your knee or thigh. Hold this position for 30 seconds. Slowly slide your left / right heel back to the starting position. Repeat 2 times. Complete this exercise 3 times per week. Exercise C: Quadriceps, Prone     Lie on your abdomen on a firm surface, such as a bed or padded floor. Bend your left / right knee and hold your ankle. If you cannot reach your ankle or pant leg, loop  a belt around your foot and grab the belt instead. Gently pull your heel toward your buttocks. Your knee should not slide out to the side. You should feel a stretch in the front of your thigh and knee. Hold this position for 30 seconds. Repeat 2 times. Complete this stretch 3 times per week. Exercise D: Hamstring, Supine  Lie on your back. Loop a belt or towel over the ball of your left / right foot. The ball of your foot is on the walking surface, right under your toes. Straighten your left / right knee and slowly pull on the belt to raise your leg until you feel a gentle stretch behind your knee. Do not let your left / right knee bend while you do this. Keep your other leg flat on the floor. Hold this position for 30 seconds. Repeat 2 times. Complete this stretch 3 times per week. STRENGTHENING EXERCISES  These exercises build strength and endurance in your knee. Endurance is the ability to use your muscles for a long time, even after they get tired. Exercise E: Quadriceps, Isometric     Lie on your back with your left / right leg extended and your other knee bent. Put a rolled towel or small pillow under your knee if told by your health care provider. Slowly tense the muscles in the front  of your left / right thigh. You should see your kneecap slide up toward your hip or see increased dimpling just above the knee. This motion will push the back of the knee toward the floor. For 3 seconds, keep the muscle as tight as you can without increasing your pain. Relax the muscles slowly and completely. Repeat for 10 total reps Repeat 2 ti mes. Complete this exercise 3 times per week. Exercise F: Straight Leg Raises - Quadriceps  Lie on your back with your left / right leg extended and your other knee bent. Tense the muscles in the front of your left / right thigh. You should see your kneecap slide up or see increased dimpling just above the knee. Your thigh may even shake a bit. Keep these muscles  tight as you raise your leg 4-6 inches (10-15 cm) off the floor. Do not let your knee bend. Hold this position for 3 seconds. Keep these muscles tense as you lower your leg. Relax your muscles slowly and completely after each repetition. 10 total reps. Repeat 2 times. Complete this exercise 3 times per week.  Exercise G: Hamstring Curls     If told by your health care provider, do this exercise while wearing ankle weights. Begin with 5 lb weights (optional). Then increase the weight by 1 lb (0.5 kg) increments. Do not wear ankle weights that are more than 20 lbs to start with. Lie on your abdomen with your legs straight. Bend your left / right knee as far as you can without feeling pain. Keep your hips flat against the floor. Hold this position for 3 seconds. Slowly lower your leg to the starting position. Repeat for 10 reps.  Repeat 2 times. Complete this exercise 3 times per week. Exercise H: Squats (Quadriceps)  Stand in front of a table, with your feet and knees pointing straight ahead. You may rest your hands on the table for balance but not for support. Slowly bend your knees and lower your hips like you are going to sit in a chair. Keep your weight over your heels, not over your toes. Keep your lower legs upright so they are parallel with the table legs. Do not let your hips go lower than your knees. Do not bend lower than told by your health care provider. If your knee pain increases, do not bend as low. Hold the squat position for 1 second. Slowly push with your legs to return to standing. Do not use your hands to pull yourself to standing. Repeat 2 times. Complete this exercise 3 times per week. Exercise I: Wall Slides (Quadriceps)     Lean your back against a smooth wall or door while you walk your feet out 18-24 inches (46-61 cm) from it. Place your feet hip-width apart. Slowly slide down the wall or door until your knees Repeat 2 times. Complete this exercise every other  day. Exercise K: Straight Leg Raises - Hip Abductors  Lie on your side with your left / right leg in the top position. Lie so your head, shoulder, knee, and hip line up. You may bend your bottom knee to help you keep your balance. Roll your hips slightly forward so your hips are stacked directly over each other and your left / right knee is facing forward. Leading with your heel, lift your top leg 4-6 inches (10-15 cm). You should feel the muscles in your outer hip lifting. Do not let your foot drift forward. Do not let your knee roll  toward the ceiling. Hold this position for 3 seconds. Slowly return your leg to the starting position. Let your muscles relax completely after each repetition. 10 total reps. Repeat 2 times. Complete this exercise 3 times per week. Exercise J: Straight Leg Raises - Hip Extensors  Lie on your abdomen on a firm surface. You can put a pillow under your hips if that is more comfortable. Tense the muscles in your buttocks and lift your left / right leg about 4-6 inches (10-15 cm). Keep your knee straight as you lift your leg. Hold this position for 3 seconds. Slowly lower your leg to the starting position. Let your leg relax completely after each repetition. Repeat 2 times. Complete this exercise 3 times per week. Document Released: 04/17/2005 Document Revised: 02/26/2016 Document Reviewed: 04/09/2015 [employer] Interactive Patient Education  2017 [place] (ROM) AND STRETCHING EXERCISES - Low Back Pain Most people with lower back pain will find that their symptoms get worse with excessive bending forward (flexion) or arching at the lower back (extension). The exercises that will help resolve your symptoms will focus on the opposite motion.  If you have pain, numbness or tingling which travels down into your buttocks, leg or foot, the goal of the therapy is for these symptoms to move closer to your back and eventually resolve.  Sometimes, these leg symptoms will get better, but your lower back pain may worsen. This is often an indication of progress in your rehabilitation. Be very alert to any changes in your symptoms and the activities in which you participated in the 24 hours prior to the change. Sharing this information with your caregiver will allow him or her to most efficiently treat your condition. These exercises may help you when beginning to rehabilitate your injury. Your symptoms may resolve with or without further involvement from your physician, physical therapist or athletic trainer. While completing these exercises, remember:  Restoring tissue flexibility helps normal motion to return to the joints. This allows healthier, less painful movement and activity. An effective stretch should be held for at least 30 seconds. A stretch should never be painful. You should only feel a gentle lengthening or release in the stretched tissue. FLEXION RANGE OF MOTION AND STRETCHING EXERCISES:  STRETCH - Flexion, Single Knee to Chest  Lie on a firm bed or floor with both legs extended in front of you. Keeping one leg in contact with the floor, bring your opposite knee to your chest. Hold your leg in place by either grabbing behind your thigh or at your knee. Pull until you feel a gentle stretch in your low back. Hold 30 seconds. Slowly release your grasp and repeat the exercise with the opposite side. Repeat 2 times. Complete this exercise 3 times per week.   STRETCH - Flexion, Double Knee to Chest Lie on a firm bed or floor with both legs extended in front of you. Keeping one leg in contact with the floor, bring your opposite knee to your chest. Tense your stomach muscles to support your back and then lift your other knee to your chest. Hold your legs in place by either grabbing behind your thighs or at your knees. Pull both knees toward your chest until you feel a gentle stretch in your low back. Hold 30 seconds. Tense  your stomach muscles and slowly return one leg at a time to the floor. Repeat 2 times. Complete this exercise 3 times per week.   STRETCH - Low Trunk  Rotation Lie on a firm bed or floor. Keeping your legs in front of you, bend your knees so they are both pointed toward the ceiling and your feet are flat on the floor. Extend your arms out to the side. This will stabilize your upper body by keeping your shoulders in contact with the floor. Gently and slowly drop both knees together to one side until you feel a gentle stretch in your low back. Hold for 30 seconds. Tense your stomach muscles to support your lower back as you bring your knees back to the starting position. Repeat the exercise to the other side. Repeat 2 times. Complete this exercise at least 3 times per week.   EXTENSION RANGE OF MOTION AND FLEXIBILITY EXERCISES:  STRETCH - Extension, Prone on Elbows  Lie on your stomach on the floor, a bed will be too soft. Place your palms about shoulder width apart and at the height of your head. Place your elbows under your shoulders. If this is too painful, stack pillows under your chest. Allow your body to relax so that your hips drop lower and make contact more completely with the floor. Hold this position for 30 seconds. Slowly return to lying flat on the floor. Repeat 2 times. Complete this exercise 3 times per week.   RANGE OF MOTION - Extension, Prone Press Ups Lie on your stomach on the floor, a bed will be too soft. Place your palms about shoulder width apart and at the height of your head. Keeping your back as relaxed as possible, slowly straighten your elbows while keeping your hips on the floor. You may adjust the placement of your hands to maximize your comfort. As you gain motion, your hands will come more underneath your shoulders. Hold this position 30 seconds. Slowly return to lying flat on the floor. Repeat 2 times. Complete this exercise 3 times per week.   RANGE OF  MOTION- Quadruped, Neutral Spine  Assume a hands and knees position on a firm surface. Keep your hands under your shoulders and your knees under your hips. You may place padding under your knees for comfort. Drop your head and point your tailbone toward the ground below you. This will round out your lower back like an angry cat. Hold this position for 30 seconds. Slowly lift your head and release your tail bone so that your back sags into a large arch, like an old horse. Hold this position for 30 seconds. Repeat this until you feel limber in your low back. Now, find your "sweet spot." This will be the most comfortable position somewhere between the two previous positions. This is your neutral spine. Once you have found this position, tense your stomach muscles to support your low back. Hold this position for 30 seconds. Repeat 2 times. Complete this exercise 3 times per week.   STRENGTHENING EXERCISES - Low Back Sprain These exercises may help you when beginning to rehabilitate your injury. These exercises should be done near your "sweet spot." This is the neutral, low-back arch, somewhere between fully rounded and fully arched, that is your least painful position. When performed in this safe range of motion, these exercises can be used for people who have either a flexion or extension based injury. These exercises may resolve your symptoms with or without further involvement from your physician, physical therapist or athletic trainer. While completing these exercises, remember:  Muscles can gain both the endurance and the strength needed for everyday activities through controlled exercises. Complete these exercises  as instructed by your physician, physical therapist or athletic trainer. Increase the resistance and repetitions only as guided. You may experience muscle soreness or fatigue, but the pain or discomfort you are trying to eliminate should never worsen during these exercises. If this pain  does worsen, stop and make certain you are following the directions exactly. If the pain is still present after adjustments, discontinue the exercise until you can discuss the trouble with your caregiver.  STRENGTHENING - Deep Abdominals, Pelvic Tilt  Lie on a firm bed or floor. Keeping your legs in front of you, bend your knees so they are both pointed toward the ceiling and your feet are flat on the floor. Tense your lower abdominal muscles to press your low back into the floor. This motion will rotate your pelvis so that your tail bone is scooping upwards rather than pointing at your feet or into the floor. With a gentle tension and even breathing, hold this position for 3 seconds. Repeat 2 times. Complete this exercise 3 times per week.   STRENGTHENING - Abdominals, Crunches  Lie on a firm bed or floor. Keeping your legs in front of you, bend your knees so they are both pointed toward the ceiling and your feet are flat on the floor. Cross your arms over your chest. Slightly tip your chin down without bending your neck. Tense your abdominals and slowly lift your trunk high enough to just clear your shoulder blades. Lifting higher can put excessive stress on the lower back and does not further strengthen your abdominal muscles. Control your return to the starting position. Repeat 2 times. Complete this exercise 3 times per week.   STRENGTHENING - Quadruped, Opposite UE/LE Lift  Assume a hands and knees position on a firm surface. Keep your hands under your shoulders and your knees under your hips. You may place padding under your knees for comfort. Find your neutral spine and gently tense your abdominal muscles so that you can maintain this position. Your shoulders and hips should form a rectangle that is parallel with the floor and is not twisted. Keeping your trunk steady, lift your right hand no higher than your shoulder and then your left leg no higher than your hip. Make sure you are not  holding your breath. Hold this position for 30 seconds. Continuing to keep your abdominal muscles tense and your back steady, slowly return to your starting position. Repeat with the opposite arm and leg. Repeat 2 times. Complete this exercise 3 times per week.   STRENGTHENING - Abdominals and Quadriceps, Straight Leg Raise  Lie on a firm bed or floor with both legs extended in front of you. Keeping one leg in contact with the floor, bend the other knee so that your foot can rest flat on the floor. Find your neutral spine, and tense your abdominal muscles to maintain your spinal position throughout the exercise. Slowly lift your straight leg off the floor about 6 inches for a count of 3, making sure to not hold your breath. Still keeping your neutral spine, slowly lower your leg all the way to the floor. Repeat this exercise with each leg 2 times. Complete this exercise 3 times per week.  POSTURE AND BODY MECHANICS CONSIDERATIONS - Low Back Sprain Keeping correct posture when sitting, standing or completing your activities will reduce the stress put on different body tissues, allowing injured tissues a chance to heal and limiting painful experiences. The following are general guidelines for improved posture.  While reading  these guidelines, remember: The exercises prescribed by your provider will help you have the flexibility and strength to maintain correct postures. The correct posture provides the best environment for your joints to work. All of your joints have less wear and tear when properly supported by a spine with good posture. This means you will experience a healthier, less painful body. Correct posture must be practiced with all of your activities, especially prolonged sitting and standing. Correct posture is as important when doing repetitive low-stress activities (typing) as it is when doing a single heavy-load activity (lifting).  RESTING POSITIONS Consider which positions are most  painful for you when choosing a resting position. If you have pain with flexion-based activities (sitting, bending, stooping, squatting), choose a position that allows you to rest in a less flexed posture. You would want to avoid curling into a fetal position on your side. If your pain worsens with extension-based activities (prolonged standing, working overhead), avoid resting in an extended position such as sleeping on your stomach. Most people will find more comfort when they rest with their spine in a more neutral position, neither too rounded nor too arched. Lying on a non-sagging bed on your side with a pillow between your knees, or on your back with a pillow under your knees will often provide some relief. Keep in mind, being in any one position for a prolonged period of time, no matter how correct your posture, can still lead to stiffness.  PROPER SITTING POSTURE In order to minimize stress and discomfort on your spine, you must sit with correct posture. Sitting with good posture should be effortless for a healthy body. Returning to good posture is a gradual process. Many people can work toward this most comfortably by using various supports until they have the flexibility and strength to maintain this posture on their own. When sitting with proper posture, your ears will fall over your shoulders and your shoulders will fall over your hips. You should use the back of the chair to support your upper back. Your lower back will be in a neutral position, just slightly arched. You may place a small pillow or folded towel at the base of your lower back for  support.  When working at a desk, create an environment that supports good, upright posture. Without extra support, muscles tire, which leads to excessive strain on joints and other tissues. Keep these recommendations in mind:  CHAIR: A chair should be able to slide under your desk when your back makes contact with the back of the chair. This allows  you to work closely. The chair's height should allow your eyes to be level with the upper part of your monitor and your hands to be slightly lower than your elbows.  BODY POSITION Your feet should make contact with the floor. If this is not possible, use a foot rest. Keep your ears over your shoulders. This will reduce stress on your neck and low back.  INCORRECT SITTING POSTURES  If you are feeling tired and unable to assume a healthy sitting posture, do not slouch or slump. This puts excessive strain on your back tissues, causing more damage and pain. Healthier options include: Using more support, like a lumbar pillow. Switching tasks to something that requires you to be upright or walking. Talking a brief walk. Lying down to rest in a neutral-spine position.  PROLONGED STANDING WHILE SLIGHTLY LEANING FORWARD  When completing a task that requires you to lean forward while standing in one place  for a long time, place either foot up on a stationary 2-4 inch high object to help maintain the best posture. When both feet are on the ground, the lower back tends to lose its slight inward curve. If this curve flattens (or becomes too large), then the back and your other joints will experience too much stress, tire more quickly, and can cause pain.  CORRECT STANDING POSTURES Proper standing posture should be assumed with all daily activities, even if they only take a few moments, like when brushing your teeth. As in sitting, your ears should fall over your shoulders and your shoulders should fall over your hips. You should keep a slight tension in your abdominal muscles to brace your spine. Your tailbone should point down to the ground, not behind your body, resulting in an over-extended swayback posture.   INCORRECT STANDING POSTURES  Common incorrect standing postures include a forward head, locked knees and/or an excessive swayback. WALKING Walk with an upright posture. Your ears, shoulders and  hips should all line-up.  PROLONGED ACTIVITY IN A FLEXED POSITION When completing a task that requires you to bend forward at your waist or lean over a low surface, try to find a way to stabilize 3 out of 4 of your limbs. You can place a hand or elbow on your thigh or rest a knee on the surface you are reaching across. This will provide you more stability, so that your muscles do not tire as quickly. By keeping your knees relaxed, or slightly bent, you will also reduce stress across your lower back. CORRECT LIFTING TECHNIQUES  DO : Assume a wide stance. This will provide you more stability and the opportunity to get as close as possible to the object which you are lifting. Tense your abdominals to brace your spine. Bend at the knees and hips. Keeping your back locked in a neutral-spine position, lift using your leg muscles. Lift with your legs, keeping your back straight. Test the weight of unknown objects before attempting to lift them. Try to keep your elbows locked down at your sides in order get the best strength from your shoulders when carrying an object.   Always ask for help when lifting heavy or awkward objects. INCORRECT LIFTING TECHNIQUES DO NOT:  Lock your knees when lifting, even if it is a small object. Bend and twist. Pivot at your feet or move your feet when needing to change directions. Assume that you can safely pick up even a paperclip without proper posture.

## 2022-07-31 ENCOUNTER — Encounter: Payer: Self-pay | Admitting: Physical Medicine and Rehabilitation

## 2022-08-19 ENCOUNTER — Ambulatory Visit: Payer: 59 | Admitting: Interventional Cardiology

## 2022-08-21 ENCOUNTER — Other Ambulatory Visit: Payer: Self-pay

## 2022-08-21 ENCOUNTER — Other Ambulatory Visit: Payer: Self-pay | Admitting: Family Medicine

## 2022-08-21 ENCOUNTER — Other Ambulatory Visit (HOSPITAL_BASED_OUTPATIENT_CLINIC_OR_DEPARTMENT_OTHER): Payer: Self-pay

## 2022-08-21 DIAGNOSIS — B360 Pityriasis versicolor: Secondary | ICD-10-CM

## 2022-08-21 MED ORDER — AMLODIPINE BESYLATE 10 MG PO TABS
10.0000 mg | ORAL_TABLET | Freq: Every day | ORAL | 2 refills | Status: DC
  Filled 2022-08-21 – 2022-10-02 (×5): qty 90, 90d supply, fill #0
  Filled 2022-10-30: qty 90, 90d supply, fill #1
  Filled 2022-10-31: qty 60, 60d supply, fill #1
  Filled 2022-11-19 – 2022-11-22 (×3): qty 90, 90d supply, fill #1

## 2022-08-22 ENCOUNTER — Other Ambulatory Visit (HOSPITAL_BASED_OUTPATIENT_CLINIC_OR_DEPARTMENT_OTHER): Payer: Self-pay

## 2022-08-22 MED ORDER — KETOCONAZOLE 2 % EX CREA
1.0000 | TOPICAL_CREAM | Freq: Every day | CUTANEOUS | 0 refills | Status: DC
  Filled 2022-08-22: qty 30, 30d supply, fill #0

## 2022-08-27 NOTE — Progress Notes (Signed)
Subjective:    Patient ID: Joshua Thompson, male    DOB: 1945/08/14, 77 y.o.   MRN: [id number]  HPI HPI  Joshua Thompson is a 77 y.o. year old male  who  has a past medical history of Coronary artery disease, GERD (gastroesophageal reflux disease), Hyperlipidemia, and Hypertension.   They are presenting to PM&R clinic as a new patient for pain management evaluation. They were referred by Shelda Pal, DO  for treatment of BILATERAL LOW BACK pain.   Per their last note: "Low back pain 4-5 years hx of chronic low back pain. Failed physical therapy, MRI showed spinal stenosis. IM steroid injection helps but wears. Some tingling in LE's, no weakness, bruising, swelling."  08/01/2016 MRI: IMPRESSION: 1. Diffuse lumbar spinal canal narrowing secondary to congenitally short pedicles. 2. Severe spinal canal stenosis at L4-L5, moderate spinal canal stenosis at L3-L4 and mild spinal canal stenosis at L1-L2 and L2-L3. 3. Multilevel mild-to-moderate foraminal narrowing, worst at right L4-L5.   Source: Low back L>R and left knee pain Inciting incident: Was trying to load a heavy box into a car several decades ago and felt a "crunch" in his back, injured L4-5 disc.  Description of pain: Constant, aching, positionally worsening Exacerbating factors: bending forward, standing, rest, and cold Remitting factors: massage, heating pad, injection, and sitting down , extending back, moving, laying flat. Red flag symptoms: +Urinary urgency/nocturia, + L leg numbness - chronic. No red flags for back pain endorsed in Hx or ROS  Medications tried: Topical medications (mild effect) : Took biofreeze in the past Nsaids (unsure of effect) : has CKD, avoids medications that worsen cr.  Tylenol  (unsure of effect) : Avoids it. No liver issues.  Opiates  ( terrible side effects ) : Was given Norco 5 mg - has side effect of vertigo/spinning. Tramadol prescribed once and also caused spinning.   Gabapentin / Lyrica  (never tried) :  TCAs  (never tried) :  SNRIs  (never tried) :  Other  (never tried) :   Other treatments: PT/OT  (no effect) : was in PT for his back 2 years ago, without benefit. He was put on a stretch table and found no benefit.  Accupuncture/chiropractor/massage  (no effect) : Saw a chiropractor for awhile; did not find it helpful.  TENs unit (never tried) :  Injections (moderate effect) : ?Get steroid injections every 4-6 month; most recently 2-3 weeks ago - was in his R buttocks. Has ESI for anesthesia, but not for pain control.  Surgery (never tried) : "I started getting some telephone calls from [place], saying they were doing some laser work. I never took it as a follow up. I odn't know how they got my number." He has never talked to a neurosurgeon.    Prior UDS results: No results found for: "LABOPIA", "COCAINSCRNUR", "LABBENZ", "AMPHETMU", "THCU", "LABBARB"     Pain Inventory Average Pain 7 Pain Right Now 3 My pain is constant, sharp, dull, and aching  In the last 24 hours, has pain interfered with the following? General activity 3 Relation with others 2 Enjoyment of life 3 What TIME of day is your pain at its worst? morning  and evening Sleep (in general) Fair  Pain is worse with: walking, sitting, some activites, and cold weather Pain improves with: heat/ice and injections Relief from Meds:  fair   Mobility;  Walks without assistance for 10 minutes Can climb steps Can drive  Function Works in [employer] 30 hours per  week Need help with household duties  Neuro/ Psych Numbness, Trouble walking  Family History  Problem Relation Age of Onset   Hypertension Mother    Stroke Maternal Grandfather    Arrhythmia Neg Hx    Heart attack Neg Hx    Social History   Socioeconomic History   Marital status: Married    Spouse name: Not on file   Number of children: Not on file   Years of education: Not on file   Highest education level:  Not on file  Occupational History   Not on file  Tobacco Use   Smoking status: Never   Smokeless tobacco: Never  Vaping Use   Vaping Use: Never used  Substance and Sexual Activity   Alcohol use: No   Drug use: No   Sexual activity: Not on file  Other Topics Concern   Not on file  Social History Narrative   Not on file   Social Determinants of Health   Financial Resource Strain: Not on file  Food Insecurity: Not on file  Transportation Needs: Not on file  Physical Activity: Not on file  Stress: Not on file  Social Connections: Not on file   Past Surgical History:  Procedure Laterality Date   CORONARY ANGIOPLASTY     3 STENTS   TRANSURETHRAL RESECTION OF PROSTATE     HAD DONE THREE TIMES   Past Surgical History:  Procedure Laterality Date   CORONARY ANGIOPLASTY     3 STENTS   TRANSURETHRAL RESECTION OF PROSTATE     HAD DONE THREE TIMES   Past Medical History:  Diagnosis Date   Coronary artery disease    GERD (gastroesophageal reflux disease)    Hyperlipidemia    Hypertension    BP (!) 157/72   Pulse 68   Ht 5\' 7"  (1.702 m)   Wt 158 lb (71.7 kg)   SpO2 98%   BMI 24.75 kg/m   Opioid Risk Score:   Fall Risk Score:  `1  Depression screen [facility] 2/9     08/28/2022    9:58 AM 07/24/2022    7:06 AM 02/13/2021    9:45 AM 01/12/2020    7:58 AM  Depression screen PHQ 2/9  Decreased Interest 1 0 0 0  Down, Depressed, Hopeless 0 0 0 0  PHQ - 2 Score 1 0 0 0  Altered sleeping 0 2    Tired, decreased energy 2 0    Change in appetite 0 0    Feeling bad or failure about yourself  0 0    Trouble concentrating 0 0    Moving slowly or fidgety/restless 0 0    Suicidal thoughts 0 0    PHQ-9 Score 3 2    Difficult doing work/chores  Not difficult at all      Review of Systems  Respiratory:  Positive for shortness of breath.   Cardiovascular:  Positive for leg swelling.       Left leg swelling  Genitourinary:  Positive for penile discharge.  Musculoskeletal:   Positive for back pain and gait problem.       Left leg pain  Neurological:  Positive for numbness.  All other systems reviewed and are negative.      Objective:   Physical Exam   PE: Constitution: Appropriate appearance for age. No apparent distress  Resp: No respiratory distress. No accessory muscle usage. on RA Cardio: Well perfused appearance. No peripheral edema. Abdomen: Nondistended. Nontender.   Psych: Appropriate mood  and affect. Neuro: AAOx4. No apparent cognitive deficits   Neurologic Exam:   DTRs: Reflexes were 2+ in bilateral achilles, patella, biceps, BR and triceps. Babinsky: flexor responses b/l.   Hoffmans: negative b/l Sensory exam: revealed normal sensation in all dermatomal regions in bilateral lower extremities Motor exam: strength 5/5 throughout bilateral lower extremities Coordination: Fine motor coordination was normal.   Gait: +trendelenberg  Back Exam:   Inspection: Pelvis was  even.  Lumbar lordotic curvature showed mild dextroscoliosis.    Special/provocative testing:    Slump test: -    Facet loading: -    TTP at paraspinals: +    Knee Exam: L knee Inspection: No gross deformities seen.  No effusion present.  No erythema present.  No evidence of Genu Valgum/Varum or Recurvatum.    No effusion present along joint line or along bursal sac with tightening of quad muscle (laying supine).  Palpation: No increased warmth around joint. No notable tenderness.  No crepitus present.    ROM:  Flexion  (135*), Extension  (0*).  No hyperextension noted .  Strength Hamstring (bring knee back to table) 5 /5 Quadriceps (kick out) 5 /5  Special/Provacative testing:  Anterior Drawer Sign - mild laxity compared to R   Lachmans (15* flexed)- - McMurrays (Meniscus)- -   Valgus Stress (Collateral Lig) joint should not open, if does open then MCL and ACL injury - - Varus Stress (Collateral Lig) - - Thessaly's Test (Meniscus) -  -     Assessment & Plan:    Darol Cush is a 77 y.o. year old male  who  has a past medical history of Coronary artery disease, GERD (gastroesophageal reflux disease), Hyperlipidemia, and Hypertension.   They are presenting to PM&R clinic as a new patient for treatment of chronic low back and L knee pain . They were referred by Dr. Nani Ravens .    Chronic bilateral low back pain with left-sided sciatica Assessment & Plan:  I recommend obtaining a TENS unit for use on the low back for pain control.  Please paste the pads over the painful muscles, and turn the machine onto a low setting, for 10 minutes at a time.  Please utilize Tylenol at least 500 mg 2-3 times daily for daily pain control, along with over-the-counter Voltaren gel to your low back and left knee twice daily for 1 to 2 weeks.   Orders: -     MR LUMBAR SPINE WO CONTRAST; Future  Acute pain of left knee Assessment & Plan: L knee xray ordered  Orders: -     DG Knee 1-2 Views Left; Future  Spinal stenosis of lumbar region, unspecified whether neurogenic claudication present Assessment & Plan: 08/01/2016 MRI: 1. Diffuse lumbar spinal canal narrowing secondary to congenitally short pedicles. 2. Severe spinal canal stenosis at L4-L5, moderate spinal canal stenosis at L3-L4 and mild spinal canal stenosis at L1-L2 and L2-L3.  Given worsening symptoms and >7 years since prior imaging, will obtain updated imaging of low back via MRI.  Please have this performed before your next follow-up.  Failed PT trial as OP  I will be referring you to Dr. Letta Pate for left-sided epidural steroid injection, you will need to get the MRI performed for this procedure.  I will follow-up with you in 2 months.  I will call or message you through MyChart to discuss results of the imaging as above. 3. Multilevel mild-to-moderate foraminal narrowing, worst at right L4-L5.

## 2022-08-28 ENCOUNTER — Other Ambulatory Visit (HOSPITAL_BASED_OUTPATIENT_CLINIC_OR_DEPARTMENT_OTHER): Payer: Self-pay

## 2022-08-28 ENCOUNTER — Encounter: Payer: Self-pay | Admitting: Physical Medicine and Rehabilitation

## 2022-08-28 ENCOUNTER — Encounter: Payer: 59 | Attending: Physical Medicine and Rehabilitation | Admitting: Physical Medicine and Rehabilitation

## 2022-08-28 VITALS — BP 157/72 | HR 68 | Ht 67.0 in | Wt 158.0 lb

## 2022-08-28 DIAGNOSIS — M25562 Pain in left knee: Secondary | ICD-10-CM | POA: Insufficient documentation

## 2022-08-28 DIAGNOSIS — M5442 Lumbago with sciatica, left side: Secondary | ICD-10-CM | POA: Insufficient documentation

## 2022-08-28 DIAGNOSIS — M48061 Spinal stenosis, lumbar region without neurogenic claudication: Secondary | ICD-10-CM | POA: Insufficient documentation

## 2022-08-28 DIAGNOSIS — G8929 Other chronic pain: Secondary | ICD-10-CM | POA: Diagnosis not present

## 2022-08-28 NOTE — Patient Instructions (Addendum)
I have ordered imaging of your low back via MRI and of your left knee via x-ray.  Please have this performed before your next follow-up.  I will be referring you to Dr. Letta Pate for left-sided epidural steroid injection, you will need to get the MRI performed for this procedure.  I recommend obtaining a TENS unit for use on the low back for pain control.  Please paste the pads over the painful muscles, and turn the machine onto a low setting, for 10 minutes at a time.   Please utilize Tylenol at least 500 mg 2-3 times daily for daily pain control, along with over-the-counter Voltaren gel to your low back and left knee twice daily for 1 to 2 weeks.  I will follow-up with you in 2 months.  I will call or message you through MyChart to discuss results of the imaging as above.

## 2022-09-02 ENCOUNTER — Telehealth: Payer: Self-pay

## 2022-09-02 DIAGNOSIS — M48061 Spinal stenosis, lumbar region without neurogenic claudication: Secondary | ICD-10-CM | POA: Insufficient documentation

## 2022-09-02 DIAGNOSIS — M25562 Pain in left knee: Secondary | ICD-10-CM | POA: Insufficient documentation

## 2022-09-02 NOTE — Assessment & Plan Note (Signed)
L knee x ray ordered

## 2022-09-02 NOTE — Telephone Encounter (Signed)
PA was denied for this patient. I have sat this on your desk for you to review and if you decide to do a peer to peer  Per Insurance the notes do not show there is pain, tingling, burning and/or changes in the sense of touch that travels to the arm(s) and/ or leg(s) in a pattern related to the level of the spine to be treated.  A four week trial of doctor ordered treatment that has not helped.  You will take part in a program to manage pain. This must include: therapy; medicine; information to help you understand your condition; and mental and social support.

## 2022-09-02 NOTE — Assessment & Plan Note (Addendum)
  I recommend obtaining a TENS unit for use on the low back for pain control.  Please paste the pads over the painful muscles, and turn the machine onto a low setting, for 10 minutes at a time.   Please utilize Tylenol at least 500 mg 2-3 times daily for daily pain control, along with over-the-counter Voltaren gel to your low back and left knee twice daily for 1 to 2 weeks.

## 2022-09-02 NOTE — Assessment & Plan Note (Addendum)
08/01/2016 MRI: 1. Diffuse lumbar spinal canal narrowing secondary to congenitally short pedicles. 2. Severe spinal canal stenosis at L4-L5, moderate spinal canal stenosis at L3-L4 and mild spinal canal stenosis at L1-L2 and L2-L3.  Given worsening symptoms and >7 years since prior imaging, will obtain updated imaging of low back via MRI.  Please have this performed before your next follow-up.  Failed PT trial as OP  I will be referring you to Dr. Letta Pate for left-sided epidural steroid injection, you will need to get the MRI performed for this procedure.  I will follow-up with you in 2 months.  I will call or message you through MyChart to discuss results of the imaging as above. 3. Multilevel mild-to-moderate foraminal narrowing, worst at right L4-L5.

## 2022-09-04 ENCOUNTER — Encounter: Payer: Self-pay | Admitting: Physical Medicine and Rehabilitation

## 2022-09-04 DIAGNOSIS — M48061 Spinal stenosis, lumbar region without neurogenic claudication: Secondary | ICD-10-CM

## 2022-09-04 DIAGNOSIS — G8929 Other chronic pain: Secondary | ICD-10-CM

## 2022-09-07 ENCOUNTER — Ambulatory Visit (INDEPENDENT_AMBULATORY_CARE_PROVIDER_SITE_OTHER): Payer: 59

## 2022-09-07 DIAGNOSIS — M5442 Lumbago with sciatica, left side: Secondary | ICD-10-CM

## 2022-09-07 DIAGNOSIS — G8929 Other chronic pain: Secondary | ICD-10-CM

## 2022-09-07 DIAGNOSIS — M25562 Pain in left knee: Secondary | ICD-10-CM

## 2022-09-11 ENCOUNTER — Other Ambulatory Visit: Payer: Self-pay | Admitting: Family Medicine

## 2022-09-11 DIAGNOSIS — I10 Essential (primary) hypertension: Secondary | ICD-10-CM

## 2022-09-11 MED ORDER — CARVEDILOL 25 MG PO TABS: 25.0000 mg | ORAL_TABLET | Freq: Two times a day (BID) | ORAL | 1 refills | Status: DC

## 2022-09-13 ENCOUNTER — Encounter: Payer: Self-pay | Admitting: Physical Medicine and Rehabilitation

## 2022-09-14 ENCOUNTER — Encounter: Payer: Self-pay | Admitting: Family Medicine

## 2022-09-16 ENCOUNTER — Other Ambulatory Visit: Payer: Self-pay | Admitting: Family Medicine

## 2022-09-16 ENCOUNTER — Other Ambulatory Visit (HOSPITAL_BASED_OUTPATIENT_CLINIC_OR_DEPARTMENT_OTHER): Payer: Self-pay

## 2022-09-16 MED ORDER — OMEPRAZOLE 40 MG PO CPDR
40.0000 mg | DELAYED_RELEASE_CAPSULE | Freq: Every day | ORAL | 3 refills | Status: DC
  Filled 2022-09-16 – 2022-09-23 (×2): qty 30, 30d supply, fill #0
  Filled 2022-10-02 – 2022-10-30 (×4): qty 30, 30d supply, fill #1
  Filled 2022-10-30 – 2022-11-25 (×4): qty 30, 30d supply, fill #2

## 2022-09-18 NOTE — Progress Notes (Deleted)
  PROCEDURE RECORD [facility]   Name: Menachem Yudin DOB:1945/12/04 MRN: [id number]  Date:09/18/2022  Physician: Alysia Penna, MD    Nurse/CMA: Jorja Loa  Allergies:  Allergies  Allergen Reactions   Amoxicillin Rash   Amoxicillin-Pot Clavulanate Rash   Ampicillin Rash    Consent Signed: {yes no:314532}  Is patient diabetic? {yes no:314532}  CBG today? ***  Pregnant: {yes no:314532} LMP: No LMP for male patient. (age 75-55)  Anticoagulants: {Yes/No:19989} Anti-inflammatory: {Yes/No:19989} Antibiotics: {Yes/No:19989}  Procedure: Epidural Steroid Injection -Left  Position: Prone Start Time: ***  End Time: ***  Fluoro Time: ***  RN/CMA      Time      BP      Pulse      Respirations      O2 Sat      S/S      Pain Level       D/C home with ***, patient A & O X 3, D/C instructions reviewed, and sits independently.

## 2022-09-23 ENCOUNTER — Other Ambulatory Visit (HOSPITAL_BASED_OUTPATIENT_CLINIC_OR_DEPARTMENT_OTHER): Payer: Self-pay

## 2022-09-23 ENCOUNTER — Other Ambulatory Visit: Payer: Self-pay | Admitting: Family Medicine

## 2022-09-23 DIAGNOSIS — B360 Pityriasis versicolor: Secondary | ICD-10-CM

## 2022-09-23 MED ORDER — KETOCONAZOLE 2 % EX CREA
1.0000 | TOPICAL_CREAM | Freq: Every day | CUTANEOUS | 0 refills | Status: DC
Start: 2022-09-23 — End: 2022-10-02
  Filled 2022-09-23 (×2): qty 30, 30d supply, fill #0

## 2022-09-23 MED FILL — Isosorbide Mononitrate Tab ER 24HR 30 MG: ORAL | 90 days supply | Qty: 90 | Fill #1 | Status: CN

## 2022-09-23 NOTE — Telephone Encounter (Signed)
He should be on 25 mg of Coreg twice daily, not metoprolol. The Flomax is fine to fill if he needs.

## 2022-09-24 ENCOUNTER — Other Ambulatory Visit (HOSPITAL_BASED_OUTPATIENT_CLINIC_OR_DEPARTMENT_OTHER): Payer: Self-pay

## 2022-09-24 MED ORDER — TAMSULOSIN HCL 0.4 MG PO CAPS
0.4000 mg | ORAL_CAPSULE | Freq: Every day | ORAL | 1 refills | Status: DC
  Filled 2022-09-24 – 2022-10-02 (×2): qty 30, 30d supply, fill #0
  Filled 2022-10-28: qty 30, 30d supply, fill #1

## 2022-09-27 ENCOUNTER — Ambulatory Visit: Payer: 59 | Admitting: Physician Assistant

## 2022-09-27 ENCOUNTER — Encounter: Payer: 59 | Admitting: Physical Medicine & Rehabilitation

## 2022-10-01 ENCOUNTER — Other Ambulatory Visit (HOSPITAL_BASED_OUTPATIENT_CLINIC_OR_DEPARTMENT_OTHER): Payer: Self-pay

## 2022-10-02 ENCOUNTER — Other Ambulatory Visit: Payer: Self-pay | Admitting: Family Medicine

## 2022-10-02 ENCOUNTER — Other Ambulatory Visit (HOSPITAL_BASED_OUTPATIENT_CLINIC_OR_DEPARTMENT_OTHER): Payer: Self-pay

## 2022-10-02 DIAGNOSIS — B360 Pityriasis versicolor: Secondary | ICD-10-CM

## 2022-10-02 DIAGNOSIS — I10 Essential (primary) hypertension: Secondary | ICD-10-CM

## 2022-10-02 MED ORDER — KETOCONAZOLE 2 % EX CREA
1.0000 | TOPICAL_CREAM | Freq: Every day | CUTANEOUS | 0 refills | Status: AC
Start: 2022-10-02 — End: 2022-11-30
  Filled 2022-10-02 – 2022-10-31 (×3): qty 30, 30d supply, fill #0

## 2022-10-02 MED ORDER — HYDRALAZINE HCL 50 MG PO TABS
100.0000 mg | ORAL_TABLET | Freq: Three times a day (TID) | ORAL | 2 refills | Status: DC
Start: 2022-10-02 — End: 2023-01-21
  Filled 2022-10-02 – 2022-10-21 (×2): qty 180, 30d supply, fill #0
  Filled 2022-10-31 – 2022-11-22 (×2): qty 180, 30d supply, fill #1
  Filled 2022-12-23: qty 180, 30d supply, fill #2

## 2022-10-02 MED FILL — Isosorbide Mononitrate Tab ER 24HR 30 MG: ORAL | 90 days supply | Qty: 90 | Fill #1 | Status: AC

## 2022-10-03 ENCOUNTER — Other Ambulatory Visit (HOSPITAL_BASED_OUTPATIENT_CLINIC_OR_DEPARTMENT_OTHER): Payer: Self-pay

## 2022-10-08 ENCOUNTER — Ambulatory Visit: Payer: 59 | Admitting: Physical Therapy

## 2022-10-11 ENCOUNTER — Ambulatory Visit: Payer: 59

## 2022-10-15 ENCOUNTER — Encounter: Payer: 59 | Admitting: Physical Therapy

## 2022-10-16 ENCOUNTER — Other Ambulatory Visit (HOSPITAL_BASED_OUTPATIENT_CLINIC_OR_DEPARTMENT_OTHER): Payer: Self-pay

## 2022-10-17 ENCOUNTER — Other Ambulatory Visit (HOSPITAL_BASED_OUTPATIENT_CLINIC_OR_DEPARTMENT_OTHER): Payer: Self-pay

## 2022-10-21 ENCOUNTER — Other Ambulatory Visit: Payer: Self-pay

## 2022-10-21 ENCOUNTER — Other Ambulatory Visit (HOSPITAL_BASED_OUTPATIENT_CLINIC_OR_DEPARTMENT_OTHER): Payer: Self-pay

## 2022-10-24 ENCOUNTER — Ambulatory Visit: Payer: 59 | Admitting: Physical Medicine & Rehabilitation

## 2022-10-25 ENCOUNTER — Encounter: Payer: 59 | Admitting: Physical Therapy

## 2022-10-27 NOTE — Progress Notes (Deleted)
Cardiology Office Note   Date:  10/27/2022   ID:  [name], [name] [birth year], MRN [id number]  PCP:  Sharlene Dory, DO    No chief complaint on file.  CAD  Wt Readings from Last 3 Encounters:  08/28/22 158 lb (71.7 kg)  07/24/22 159 lb 6 oz (72.3 kg)  02/13/22 153 lb (69.4 kg)       History of Present Illness: Joshua Thompson is a 77 y.o. male  who I have seen for many years and has had HTN for about 15 years.  Extended history as follows: "In 2004, he had chest pain and then a stress test which was abnormal and then a DES (Taxus) was placed, while in [place].  He did well after that time.  Had heaviness in the chest prior while doing yoga prior to stent.      In 2010, he had leg pain and kidney pain.  He was gaining weight and feeling ill and he was retaining fluid.  His BP was high.  His creatinine had reached 1.5+.  He also had prostatic obstruction leading to the urinary issues.    In 09/2012, he had HTN, urine retention and then he had a catheter placed for his bladder.  TURP was recommended, but it was also noted that his BP was up.  Another coronary lesion was found.  He was in renal failure, Cr 4.5.  He then had two bare stents placed.  Two weeks later, he had a TURP which was unsuccessful.  He had a bladder obstruction.  He had a repeat TURP.    In August 2014, he and his wife  Moved to [place].  He followed up with urology at [facility].  It was noted that there was an irregularity of the surface of the inside of the bladder.  His Cr. Is 3.0 at baseline.    Nebivolol has worked better than Coreg in the past but was more expensive.  He now takes carvedilol BID and metoprolol for prn increased BP.   Hydralazine was recommended to be taken TID, but he did not take this.  Renal dysfunction has limited meds.    He had some sensation in his chest that resolved with an herbal medicine in 2018.   He had some high BP readings.  He continues to be compliant  with his medications.   he has followed with urology and was referred to nephrology for CRF.  GFR was 22 in 2019 per his report.   Increase of Carvedilol has helped BP.      He reported high BP readings recently.  He has had some shortness of breath as well. Systolics in the 180-190 range, typically at night.  He feels cold at night and starts shivering. Highest readings in AM are in the 150s.   Spironolactone was stopped due to hyperkalemia.     In 6/22: Normal stress test.  Normal LV function.    Has some chills in the evening.  BP spikes but then decreases.     In 2023: "Home BPs have been generally well controlled, 120-130 range.  Dr. Allena Katz has been following renal function.  Cr was decreased to 3.5 in mid August, down from 4.3.  Dialysis intro is being considered.   Has some ankle edema. "    Past Medical History:  Diagnosis Date   Coronary artery disease    GERD (gastroesophageal reflux disease)    Hyperlipidemia    Hypertension  Past Surgical History:  Procedure Laterality Date   CORONARY ANGIOPLASTY     3 STENTS   TRANSURETHRAL RESECTION OF PROSTATE     HAD DONE THREE TIMES     Current Outpatient Medications  Medication Sig Dispense Refill   allopurinol (ZYLOPRIM) 100 MG tablet Take 2 tablets (200 mg total) by mouth daily. 180 tablet 3   amLODipine (NORVASC) 10 MG tablet Take 1 tablet (10 mg total) by mouth daily. 90 tablet 2   atorvastatin (LIPITOR) 20 MG tablet Take 1 tablet (20 mg total) by mouth daily. 30 tablet 5   carvedilol (COREG) 25 MG tablet Take 1 tablet (25 mg total) by mouth 2 (two) times daily with a meal. 180 tablet 1   doxazosin (CARDURA) 2 MG tablet Take 1 tablet (2 mg total) by mouth at bedtime. 90 tablet 1   fluticasone (FLONASE) 50 MCG/ACT nasal spray Shake bottle and place 2 sprays into both nostrils daily. 16 g 6   furosemide (LASIX) 40 MG tablet Take 1 tablet (40 mg total) by mouth daily. 30 tablet 3   hydrALAZINE (APRESOLINE) 50 MG  tablet Take 2 tablets (100 mg total) by mouth 3 (three) times daily. 180 tablet 2   isosorbide mononitrate (IMDUR) 30 MG 24 hr tablet Take 1 tablet (30 mg total) by mouth daily. 90 tablet 3   ketoconazole (NIZORAL) 2 % cream Apply 1 Application topically daily. 30 g 0   levocetirizine (XYZAL) 5 MG tablet Take 1 tablet (5 mg total) by mouth every evening. 90 tablet 3   nitroGLYCERIN (NITROSTAT) 0.4 MG SL tablet Place 1 tablet (0.4 mg total) under the tongue every 5 (five) minutes as needed for chest pain. 25 tablet 3   omeprazole (PRILOSEC) 40 MG capsule Take 1 capsule (40 mg total) by mouth daily. 30 capsule 3   tamsulosin (FLOMAX) 0.4 MG CAPS capsule Take 1 capsule (0.4 mg total) by mouth daily. 30 capsule 1   No current facility-administered medications for this visit.    Allergies:   Amoxicillin, Amoxicillin-pot clavulanate, and Ampicillin    Social History:  The patient  reports that he has never smoked. He has never used smokeless tobacco. He reports that he does not drink alcohol and does not use drugs.   Family History:  The patient's ***family history includes Hypertension in his mother; Stroke in his maternal grandfather.    ROS:  Please see the history of present illness.   Otherwise, review of systems are positive for ***.   All other systems are reviewed and negative.    PHYSICAL EXAM: VS:  There were no vitals taken for this visit. , BMI There is no height or weight on file to calculate BMI. GEN: Well nourished, well developed, in no acute distress HEENT: normal Neck: no JVD, carotid bruits, or masses Cardiac: ***RRR; no murmurs, rubs, or gallops,no edema  Respiratory:  clear to auscultation bilaterally, normal work of breathing GI: soft, nontender, nondistended, + BS MS: no deformity or atrophy Skin: warm and dry, no rash Neuro:  Strength and sensation are intact Psych: euthymic mood, full affect   EKG:   The ekg ordered today demonstrates ***   Recent  Labs: 07/24/2022: ALT 10; BUN 55; Creatinine, Ser 3.58; Hemoglobin 11.2; Platelets 219.0; Potassium 4.7; Sodium 139   Lipid Panel    Component Value Date/Time   CHOL 134 07/24/2022 0751   CHOL 138 07/26/2019 0933   TRIG 84.0 07/24/2022 0751   HDL 49.20 07/24/2022 0751   HDL  55 07/26/2019 0933   CHOLHDL 3 07/24/2022 0751   VLDL 16.8 07/24/2022 0751   LDLCALC 68 07/24/2022 0751   LDLCALC 70 07/26/2019 0933     Other studies Reviewed: Additional studies/ records that were reviewed today with results demonstrating: ***.   ASSESSMENT AND PLAN:  CAD: Negative stress test in 2022. Hypertension: CKD stage IV: Avoid nephrotoxins.  Followed by Dr. Allena Katz. PAD: Right subclavian disease. Hyperlipidemia: Whole food, plant-based diet.  High-fiber diet.  Avoid processed foods.   Current medicines are reviewed at length with the patient today.  The patient concerns regarding his medicines were addressed.  The following changes have been made:  No change***  Labs/ tests ordered today include: *** No orders of the defined types were placed in this encounter.   Recommend 150 minutes/week of aerobic exercise Low fat, low carb, high fiber diet recommended  Disposition:   FU in ***   Signed, Lance Muss, MD  10/27/2022 9:00 PM    [facility] [address] [place], [place], [place]  [address] Phone: [phone]; Fax: [phone]

## 2022-10-28 ENCOUNTER — Ambulatory Visit: Payer: 59 | Attending: Interventional Cardiology | Admitting: Interventional Cardiology

## 2022-10-28 ENCOUNTER — Encounter: Payer: 59 | Attending: Physical Medicine and Rehabilitation | Admitting: Physical Medicine and Rehabilitation

## 2022-10-28 DIAGNOSIS — G8929 Other chronic pain: Secondary | ICD-10-CM | POA: Insufficient documentation

## 2022-10-28 DIAGNOSIS — M5442 Lumbago with sciatica, left side: Secondary | ICD-10-CM | POA: Insufficient documentation

## 2022-10-28 DIAGNOSIS — M48061 Spinal stenosis, lumbar region without neurogenic claudication: Secondary | ICD-10-CM | POA: Insufficient documentation

## 2022-10-28 DIAGNOSIS — E782 Mixed hyperlipidemia: Secondary | ICD-10-CM

## 2022-10-28 DIAGNOSIS — I25119 Atherosclerotic heart disease of native coronary artery with unspecified angina pectoris: Secondary | ICD-10-CM

## 2022-10-28 DIAGNOSIS — I10 Essential (primary) hypertension: Secondary | ICD-10-CM

## 2022-10-28 DIAGNOSIS — M25562 Pain in left knee: Secondary | ICD-10-CM | POA: Insufficient documentation

## 2022-10-28 DIAGNOSIS — N184 Chronic kidney disease, stage 4 (severe): Secondary | ICD-10-CM

## 2022-10-29 ENCOUNTER — Encounter: Payer: Self-pay | Admitting: Interventional Cardiology

## 2022-10-29 ENCOUNTER — Other Ambulatory Visit (HOSPITAL_BASED_OUTPATIENT_CLINIC_OR_DEPARTMENT_OTHER): Payer: Self-pay

## 2022-10-30 ENCOUNTER — Other Ambulatory Visit (HOSPITAL_BASED_OUTPATIENT_CLINIC_OR_DEPARTMENT_OTHER): Payer: Self-pay

## 2022-10-30 ENCOUNTER — Encounter: Payer: Self-pay | Admitting: Family Medicine

## 2022-10-30 ENCOUNTER — Other Ambulatory Visit: Payer: Self-pay | Admitting: Family Medicine

## 2022-10-30 DIAGNOSIS — I10 Essential (primary) hypertension: Secondary | ICD-10-CM

## 2022-10-31 ENCOUNTER — Other Ambulatory Visit (HOSPITAL_BASED_OUTPATIENT_CLINIC_OR_DEPARTMENT_OTHER): Payer: Self-pay

## 2022-10-31 ENCOUNTER — Other Ambulatory Visit: Payer: Self-pay | Admitting: Interventional Cardiology

## 2022-10-31 ENCOUNTER — Other Ambulatory Visit: Payer: Self-pay | Admitting: Family Medicine

## 2022-10-31 ENCOUNTER — Other Ambulatory Visit: Payer: Self-pay

## 2022-10-31 MED ORDER — CARVEDILOL 25 MG PO TABS
25.0000 mg | ORAL_TABLET | Freq: Two times a day (BID) | ORAL | 1 refills | Status: DC
Start: 2022-10-31 — End: 2023-05-06
  Filled 2022-10-31: qty 180, 90d supply, fill #0
  Filled 2023-01-24: qty 180, 90d supply, fill #1

## 2022-10-31 MED ORDER — ISOSORBIDE MONONITRATE ER 30 MG PO TB24
30.0000 mg | ORAL_TABLET | Freq: Every day | ORAL | 3 refills | Status: DC
  Filled 2022-10-31: qty 43, 43d supply, fill #0
  Filled 2022-11-19 – 2023-01-13 (×2): qty 90, 90d supply, fill #0
  Filled 2023-04-14: qty 90, 90d supply, fill #1
  Filled 2023-07-16: qty 90, 90d supply, fill #2
  Filled 2023-10-14: qty 90, 90d supply, fill #3

## 2022-11-01 ENCOUNTER — Other Ambulatory Visit (HOSPITAL_BASED_OUTPATIENT_CLINIC_OR_DEPARTMENT_OTHER): Payer: Self-pay

## 2022-11-01 MED ORDER — DOXAZOSIN MESYLATE 2 MG PO TABS
2.0000 mg | ORAL_TABLET | Freq: Every day | ORAL | 1 refills | Status: DC
  Filled 2022-11-01 – 2023-01-13 (×3): qty 90, 90d supply, fill #0
  Filled 2023-04-14: qty 90, 90d supply, fill #1

## 2022-11-04 ENCOUNTER — Other Ambulatory Visit (HOSPITAL_BASED_OUTPATIENT_CLINIC_OR_DEPARTMENT_OTHER): Payer: Self-pay

## 2022-11-05 ENCOUNTER — Encounter: Payer: 59 | Admitting: Physical Therapy

## 2022-11-12 ENCOUNTER — Other Ambulatory Visit (HOSPITAL_BASED_OUTPATIENT_CLINIC_OR_DEPARTMENT_OTHER): Payer: Self-pay

## 2022-11-12 ENCOUNTER — Encounter: Payer: 59 | Admitting: Physical Therapy

## 2022-11-19 ENCOUNTER — Other Ambulatory Visit (HOSPITAL_BASED_OUTPATIENT_CLINIC_OR_DEPARTMENT_OTHER): Payer: Self-pay

## 2022-11-19 ENCOUNTER — Other Ambulatory Visit: Payer: Self-pay | Admitting: Family Medicine

## 2022-11-19 MED ORDER — TAMSULOSIN HCL 0.4 MG PO CAPS
0.4000 mg | ORAL_CAPSULE | Freq: Every day | ORAL | 1 refills | Status: DC
  Filled 2022-11-19 – 2022-11-25 (×3): qty 30, 30d supply, fill #0
  Filled 2022-12-25: qty 30, 30d supply, fill #1

## 2022-11-22 ENCOUNTER — Other Ambulatory Visit (HOSPITAL_BASED_OUTPATIENT_CLINIC_OR_DEPARTMENT_OTHER): Payer: Self-pay

## 2022-11-25 ENCOUNTER — Telehealth: Payer: Self-pay | Admitting: Neurology

## 2022-11-25 ENCOUNTER — Other Ambulatory Visit (HOSPITAL_BASED_OUTPATIENT_CLINIC_OR_DEPARTMENT_OTHER): Payer: Self-pay

## 2022-11-25 ENCOUNTER — Ambulatory Visit (INDEPENDENT_AMBULATORY_CARE_PROVIDER_SITE_OTHER): Payer: 59 | Admitting: Family Medicine

## 2022-11-25 ENCOUNTER — Other Ambulatory Visit: Payer: Self-pay

## 2022-11-25 ENCOUNTER — Encounter: Payer: Self-pay | Admitting: Family Medicine

## 2022-11-25 VITALS — BP 138/79 | HR 87 | Temp 98.4°F | Ht 67.0 in | Wt 156.4 lb

## 2022-11-25 DIAGNOSIS — M545 Low back pain, unspecified: Secondary | ICD-10-CM

## 2022-11-25 DIAGNOSIS — M1A9XX0 Chronic gout, unspecified, without tophus (tophi): Secondary | ICD-10-CM | POA: Diagnosis not present

## 2022-11-25 DIAGNOSIS — N184 Chronic kidney disease, stage 4 (severe): Secondary | ICD-10-CM | POA: Diagnosis not present

## 2022-11-25 DIAGNOSIS — K219 Gastro-esophageal reflux disease without esophagitis: Secondary | ICD-10-CM

## 2022-11-25 DIAGNOSIS — L989 Disorder of the skin and subcutaneous tissue, unspecified: Secondary | ICD-10-CM | POA: Diagnosis not present

## 2022-11-25 DIAGNOSIS — G8929 Other chronic pain: Secondary | ICD-10-CM | POA: Diagnosis not present

## 2022-11-25 LAB — COMPREHENSIVE METABOLIC PANEL
ALT: 10 U/L (ref 0–53)
AST: 11 U/L (ref 0–37)
Albumin: 4.1 g/dL (ref 3.5–5.2)
Alkaline Phosphatase: 70 U/L (ref 39–117)
BUN: 46 mg/dL — ABNORMAL HIGH (ref 6–23)
CO2: 26 mEq/L (ref 19–32)
Calcium: 8.9 mg/dL (ref 8.4–10.5)
Chloride: 107 mEq/L (ref 96–112)
Creatinine, Ser: 3.94 mg/dL — ABNORMAL HIGH (ref 0.40–1.50)
GFR: 14.22 mL/min — CL (ref >60.00)
Glucose, Bld: 98 mg/dL (ref 70–99)
Potassium: 4.8 mEq/L (ref 3.5–5.1)
Sodium: 142 mEq/L (ref 135–145)
Total Bilirubin: 0.5 mg/dL (ref 0.2–1.2)
Total Protein: 6.7 g/dL (ref 6.0–8.3)

## 2022-11-25 LAB — CBC
HCT: 35.9 % — ABNORMAL LOW (ref 39.0–52.0)
Hemoglobin: 11.3 g/dL — ABNORMAL LOW (ref 13.0–17.0)
MCHC: 31.5 g/dL (ref 30.0–36.0)
MCV: 100.2 fl — ABNORMAL HIGH (ref 78.0–100.0)
Platelets: 213 10*3/uL (ref 150.0–400.0)
RBC: 3.58 Mil/uL — ABNORMAL LOW (ref 4.22–5.81)
RDW: 15 % (ref 11.5–15.5)
WBC: 6 10*3/uL (ref 4.0–10.5)

## 2022-11-25 LAB — URIC ACID: Uric Acid, Serum: 5 mg/dL (ref 4.0–7.8)

## 2022-11-25 MED ORDER — AMLODIPINE BESYLATE 5 MG PO TABS
5.0000 mg | ORAL_TABLET | Freq: Two times a day (BID) | ORAL | 3 refills | Status: DC
  Filled 2022-11-25 (×3): qty 180, 90d supply, fill #0
  Filled 2023-02-17: qty 180, 90d supply, fill #1
  Filled 2023-05-19: qty 180, 90d supply, fill #2
  Filled 2023-08-18: qty 180, 90d supply, fill #3

## 2022-11-25 MED ORDER — METHYLPREDNISOLONE ACETATE 80 MG/ML IJ SUSP
80.0000 mg | Freq: Once | INTRAMUSCULAR | Status: AC
Start: 2022-11-25 — End: 2022-11-25
  Administered 2022-11-25: 80 mg via INTRAMUSCULAR

## 2022-11-25 MED ORDER — ALLOPURINOL 100 MG PO TABS
200.0000 mg | ORAL_TABLET | Freq: Every day | ORAL | 3 refills | Status: DC
  Filled 2022-11-25 – 2023-01-13 (×2): qty 180, 90d supply, fill #0
  Filled 2023-04-14: qty 180, 90d supply, fill #1
  Filled 2023-07-16 – 2023-11-07 (×2): qty 180, 90d supply, fill #2

## 2022-11-25 MED ORDER — PANTOPRAZOLE SODIUM 40 MG PO TBEC
40.0000 mg | DELAYED_RELEASE_TABLET | Freq: Every day | ORAL | 3 refills | Status: DC
  Filled 2022-11-25 (×2): qty 30, 30d supply, fill #0
  Filled 2022-12-18 – 2022-12-20 (×2): qty 30, 30d supply, fill #1
  Filled 2023-01-17: qty 30, 30d supply, fill #2
  Filled 2023-02-17: qty 30, 30d supply, fill #3

## 2022-11-25 NOTE — Telephone Encounter (Signed)
CRITICAL VALUE STICKER  CRITICAL VALUE: GFR 14.22   DATE & TIME NOTIFIED: 11/25/2022 2:25pm  MESSENGER (representative from lab): Hope  MD NOTIFIED: Carmelia Roller  TIME OF NOTIFICATION: now

## 2022-11-25 NOTE — Progress Notes (Signed)
Chief Complaint  Patient presents with   Follow-up    Subjective Joshua Thompson is a 77 y.o. male who presents for f/u.  GERD Patient has a history of GERD.  He was on rabeprazole in the past which was very helpful but his insurance stopped covering it.  He is currently taking omeprazole 40 mg daily.  Is not helping his symptoms.  He is having waterbrash, reflux, and upper abdominal fullness.  He is also hiccuping.  He has been on pantoprazole in the past and reports it was helpful.  No blood in the stool or difficulty swallowing.  No unintentional weight loss.  L knee pain/instability When he goes down stairs, it feels unstable. Associated L outer thigh pain. Has been going on for 7 mo. Has chronic back pain as well radiating into his LLE. No inj or change in activity. Saw PM&R, got MRI showing degeneration. Steroid injections rec'd but ins denied. Did not fu. Failed PT.   Patient has a lesion on the bottom of his L foot.  It is sometimes painful and seems to scale/peel.  No obvious injury or change in activity.  There is no drainage, bruising, redness.   Past Medical History:  Diagnosis Date   Coronary artery disease    GERD (gastroesophageal reflux disease)    Hyperlipidemia    Hypertension     Exam BP 138/79 (BP Location: Left Arm, Patient Position: Sitting, Cuff Size: Normal)   Pulse 87   Temp 98.4 F (36.9 C) (Oral)   Ht 5\' 7"  (1.702 m)   Wt 156 lb 6 oz (70.9 kg)   SpO2 99%   BMI 24.49 kg/m  General:  well developed, well nourished, in no apparent distress Skin: Hyperkeratinization with a seemingly apparent central core over the lateral dorsum of his left foot. Heart: RRR, no bruits, no LE edema Lungs: clear to auscultation, no accessory muscle use Abdomen: Bowel sounds present, soft, nontender, nondistended.  No masses or organomegaly. Psych: well oriented with normal range of affect and appropriate judgment/insight  Chronic bilateral low back pain, unspecified  whether sciatica present - Plan: methylPREDNISolone acetate (DEPO-MEDROL) injection 80 mg  Gastroesophageal reflux disease, unspecified whether esophagitis present  Skin lesion  CKD (chronic kidney disease) stage 4, GFR 15-29 ml/min (HCC) - Plan: CBC, Comprehensive metabolic panel  Chronic gout involving toe without tophus, unspecified cause, unspecified laterality - Plan: Uric acid  Question whether this is actually radiation from his low back.  He reports Depo injections helpful for several months so we will provide him with 1 today.   Chronic, uncontrolled.  Stop omeprazole, start pantoprazole 40 mg daily.  If he fails this, may have a better chance of getting rabeprazole covered again.  Reflux precautions discussed. Aspirin paste daily for 10 to 15 minutes.  If no improvement the next few weeks, would consider referral to the podiatry team. Follow-up in 3 months. The patient voiced understanding and agreement to the plan.  Jilda Roche [place], DO 11/25/22  11:58 AM

## 2022-11-25 NOTE — Patient Instructions (Addendum)
Consider grinding up some aspirin and adding water to make a paste. Apply the paste to your foot daily for 10-15 minutes and it should be near resolved after several weeks. If it is not, please let me know and we will set you up with the foot doctor.   The only lifestyle changes that have data behind them are weight loss for the overweight/obese and elevating the head of the bed. Finding out which foods/positions are triggers is important.  Give [place] 2-3 business days to get the results of your labs back.   Keep the diet clean and stay active.  Let us know if you need anything.

## 2022-11-25 NOTE — Telephone Encounter (Signed)
Noted. Chronic, unchanged. Sees nephro.

## 2022-11-27 ENCOUNTER — Other Ambulatory Visit: Payer: Self-pay

## 2022-12-18 ENCOUNTER — Other Ambulatory Visit (HOSPITAL_BASED_OUTPATIENT_CLINIC_OR_DEPARTMENT_OTHER): Payer: Self-pay

## 2022-12-25 ENCOUNTER — Other Ambulatory Visit (HOSPITAL_BASED_OUTPATIENT_CLINIC_OR_DEPARTMENT_OTHER): Payer: Self-pay

## 2023-01-01 ENCOUNTER — Other Ambulatory Visit (HOSPITAL_BASED_OUTPATIENT_CLINIC_OR_DEPARTMENT_OTHER): Payer: Self-pay

## 2023-01-13 ENCOUNTER — Other Ambulatory Visit (HOSPITAL_BASED_OUTPATIENT_CLINIC_OR_DEPARTMENT_OTHER): Payer: Self-pay

## 2023-01-15 ENCOUNTER — Other Ambulatory Visit (HOSPITAL_BASED_OUTPATIENT_CLINIC_OR_DEPARTMENT_OTHER): Payer: Self-pay

## 2023-01-21 ENCOUNTER — Other Ambulatory Visit (HOSPITAL_BASED_OUTPATIENT_CLINIC_OR_DEPARTMENT_OTHER): Payer: Self-pay

## 2023-01-21 ENCOUNTER — Other Ambulatory Visit: Payer: Self-pay

## 2023-01-21 ENCOUNTER — Other Ambulatory Visit: Payer: Self-pay | Admitting: Family Medicine

## 2023-01-21 DIAGNOSIS — I2510 Atherosclerotic heart disease of native coronary artery without angina pectoris: Secondary | ICD-10-CM

## 2023-01-21 DIAGNOSIS — I10 Essential (primary) hypertension: Secondary | ICD-10-CM

## 2023-01-21 MED ORDER — HYDRALAZINE HCL 50 MG PO TABS
100.0000 mg | ORAL_TABLET | Freq: Three times a day (TID) | ORAL | 2 refills | Status: DC
Start: 2023-01-21 — End: 2023-04-21
  Filled 2023-01-21: qty 180, 30d supply, fill #0
  Filled 2023-02-17: qty 180, 30d supply, fill #1
  Filled 2023-03-19: qty 180, 30d supply, fill #2

## 2023-01-21 MED ORDER — TAMSULOSIN HCL 0.4 MG PO CAPS
0.4000 mg | ORAL_CAPSULE | Freq: Every day | ORAL | 1 refills | Status: DC
  Filled 2023-01-21: qty 30, 30d supply, fill #0
  Filled 2023-02-17: qty 30, 30d supply, fill #1

## 2023-01-21 MED ORDER — ATORVASTATIN CALCIUM 20 MG PO TABS
20.0000 mg | ORAL_TABLET | Freq: Every day | ORAL | 5 refills | Status: DC
Start: 2023-01-21 — End: 2023-07-16
  Filled 2023-01-21: qty 30, 30d supply, fill #0
  Filled 2023-02-17: qty 30, 30d supply, fill #1
  Filled 2023-03-19: qty 30, 30d supply, fill #2
  Filled 2023-04-21: qty 30, 30d supply, fill #3
  Filled 2023-05-19: qty 30, 30d supply, fill #4
  Filled 2023-06-16: qty 30, 30d supply, fill #5

## 2023-01-24 ENCOUNTER — Ambulatory Visit: Payer: 59 | Admitting: Family Medicine

## 2023-02-04 ENCOUNTER — Other Ambulatory Visit (HOSPITAL_BASED_OUTPATIENT_CLINIC_OR_DEPARTMENT_OTHER): Payer: Self-pay

## 2023-02-10 ENCOUNTER — Ambulatory Visit (INDEPENDENT_AMBULATORY_CARE_PROVIDER_SITE_OTHER): Payer: 59 | Admitting: Family Medicine

## 2023-02-10 ENCOUNTER — Other Ambulatory Visit (HOSPITAL_BASED_OUTPATIENT_CLINIC_OR_DEPARTMENT_OTHER): Payer: Self-pay

## 2023-02-10 ENCOUNTER — Encounter: Payer: Self-pay | Admitting: Family Medicine

## 2023-02-10 VITALS — BP 138/72 | HR 64 | Temp 98.9°F | Ht 67.0 in | Wt 162.1 lb

## 2023-02-10 DIAGNOSIS — M25562 Pain in left knee: Secondary | ICD-10-CM

## 2023-02-10 DIAGNOSIS — N138 Other obstructive and reflux uropathy: Secondary | ICD-10-CM | POA: Insufficient documentation

## 2023-02-10 DIAGNOSIS — I10 Essential (primary) hypertension: Secondary | ICD-10-CM | POA: Diagnosis not present

## 2023-02-10 DIAGNOSIS — M1A9XX0 Chronic gout, unspecified, without tophus (tophi): Secondary | ICD-10-CM | POA: Insufficient documentation

## 2023-02-10 DIAGNOSIS — G8929 Other chronic pain: Secondary | ICD-10-CM

## 2023-02-10 DIAGNOSIS — R0789 Other chest pain: Secondary | ICD-10-CM | POA: Diagnosis not present

## 2023-02-10 DIAGNOSIS — N400 Enlarged prostate without lower urinary tract symptoms: Secondary | ICD-10-CM | POA: Diagnosis not present

## 2023-02-10 MED ORDER — NITROGLYCERIN 0.4 MG SL SUBL
0.4000 mg | SUBLINGUAL_TABLET | SUBLINGUAL | 3 refills | Status: DC | PRN
  Filled 2023-02-10: qty 25, 1d supply, fill #0
  Filled 2023-11-16: qty 25, 1d supply, fill #1

## 2023-02-10 NOTE — Patient Instructions (Addendum)
Give [place] 2-3 business days to get the results of your labs back.   Keep the diet clean and stay active.  Stop the Lipitor for 2 weeks and send me a message on MyChart and let me know how you are doing.   If you do not hear anything about your referral in the next 1-2 weeks, call our office and ask for an update.  Please reach out to your heart doc to see if they want to see you sooner.   Let us know if you need anything.

## 2023-02-10 NOTE — Progress Notes (Signed)
Chief Complaint  Patient presents with   Follow-up    6 month   Knee Pain    Left At night waking up with SOB and sweating Left shoulder pain     Subjective  Joshua Thompson is a 77 y.o. male who presents for hypertension follow up. He does monitor home blood pressures. Blood pressures ranging from 140's/60's on average. He is compliant with medications-Norvasc 5  mg twice daily, hydralazine 100 mg 3 times daily, Cardura 2 mg nightly, Coreg 25 mg daily. Patient has these side effects of medication: none He is usually adhering to a healthy diet overall. Current exercise: some walking + CP and SOB-he has an appointment with his cardiologist coming up.   BPH History of BPH on Flomax 0.4 mg daily.  Reports compliance, no adverse effects.  Urinates 2-3 times per night.  No bleeding, discharge, pain, or constipation.  Gout Patient with a history of gout.  He currently takes allopurinol 200 mg daily.  He reports compliance and no adverse effects.  Does a fine job of staying away from high purine foods.  No recent flares.   Past Medical History:  Diagnosis Date   Coronary artery disease    GERD (gastroesophageal reflux disease)    Hyperlipidemia    Hypertension     Exam BP 138/72 (BP Location: Right Arm, Patient Position: Sitting, Cuff Size: Normal)   Pulse 64   Temp 98.9 F (37.2 C) (Oral)   Ht 5\' 7"  (1.702 m)   Wt 162 lb 2 oz (73.5 kg)   SpO2 98%   BMI 25.39 kg/m  General:  well developed, well nourished, in no apparent distress Heart: RRR, no bruits, no LE edema Lungs: clear to auscultation, no accessory muscle use MSK: Chest pain is not reproducible to palpation Mouth: MMM Psych: well oriented with normal range of affect and appropriate judgment/insight  Essential hypertension - Plan: Comprehensive metabolic panel, Lipid panel  Benign prostatic hyperplasia, unspecified whether lower urinary tract symptoms present  Chronic gout 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  Continue Coreg 25 mg twice daily, Cardura 2 mg daily, Norvasc 5 mg daily, hydralazine 100 mg 3 times daily.  Counseled on diet and exercise. Chronic, stable.  Continue Flomax 0.4 mg daily, Cardura as above. Chronic, stable.  Continue allopurinol 200 mg daily.  Counseled on avoiding purine rich foods. He has failed stretches and exercises from [place], x-ray of the knee was unremarkable, will refer to sports medicine for possible further imaging. EKG shows NSR, normal axis, no interval abnormalities, no ST segment or T wave changes, good R wave progression.  He promises to contact his cardiologist and see if they want to see him sooner.  We will refill nitro today. F/u in 6 months. The patient voiced understanding and agreement to the plan.  Jilda Roche Cumming, DO 02/10/23  4:26 PM

## 2023-02-11 ENCOUNTER — Encounter: Payer: Self-pay | Admitting: Family Medicine

## 2023-02-11 LAB — COMPREHENSIVE METABOLIC PANEL
ALT: 11 U/L (ref 0–53)
AST: 13 U/L (ref 0–37)
Albumin: 4 g/dL (ref 3.5–5.2)
Alkaline Phosphatase: 71 U/L (ref 39–117)
BUN: 45 mg/dL — ABNORMAL HIGH (ref 6–23)
CO2: 24 meq/L (ref 19–32)
Calcium: 9.1 mg/dL (ref 8.4–10.5)
Chloride: 106 mEq/L (ref 96–112)
Creatinine, Ser: 3.62 mg/dL — ABNORMAL HIGH (ref 0.40–1.50)
GFR: 15.72 mL/min — ABNORMAL LOW (ref >60.00)
Glucose, Bld: 96 mg/dL (ref 70–99)
Potassium: 4.8 mEq/L (ref 3.5–5.1)
Sodium: 138 meq/L (ref 135–145)
Total Bilirubin: 0.6 mg/dL (ref 0.2–1.2)
Total Protein: 6.6 g/dL (ref 6.0–8.3)

## 2023-02-11 LAB — LIPID PANEL
Cholesterol: 141 mg/dL (ref 0–200)
HDL: 52.7 mg/dL (ref >39.00)
LDL Cholesterol: 63 mg/dL (ref 0–99)
NonHDL: 88.35
Total CHOL/HDL Ratio: 3
Triglycerides: 129 mg/dL (ref 0.0–149.0)
VLDL: 25.8 mg/dL (ref 0.0–40.0)

## 2023-02-11 LAB — URIC ACID: Uric Acid, Serum: 5.3 mg/dL (ref 4.0–7.8)

## 2023-02-19 ENCOUNTER — Ambulatory Visit: Payer: 59 | Admitting: Sports Medicine

## 2023-02-25 ENCOUNTER — Other Ambulatory Visit (HOSPITAL_BASED_OUTPATIENT_CLINIC_OR_DEPARTMENT_OTHER): Payer: Self-pay

## 2023-03-03 ENCOUNTER — Other Ambulatory Visit (HOSPITAL_BASED_OUTPATIENT_CLINIC_OR_DEPARTMENT_OTHER): Payer: Self-pay

## 2023-03-04 ENCOUNTER — Ambulatory Visit: Payer: 59 | Admitting: Sports Medicine

## 2023-03-05 ENCOUNTER — Encounter: Payer: Self-pay | Admitting: Sports Medicine

## 2023-03-05 ENCOUNTER — Ambulatory Visit: Payer: 59 | Admitting: Sports Medicine

## 2023-03-05 VITALS — BP 120/66 | Ht 67.0 in | Wt 162.0 lb

## 2023-03-05 DIAGNOSIS — M25562 Pain in left knee: Secondary | ICD-10-CM | POA: Diagnosis not present

## 2023-03-05 DIAGNOSIS — G8929 Other chronic pain: Secondary | ICD-10-CM | POA: Diagnosis not present

## 2023-03-05 NOTE — Progress Notes (Signed)
Subjective:    Patient ID: Joshua Thompson, male    DOB: 1946/02/11, 77 y.o.   MRN: [id number]  HPI chief complaint: Left knee pain  Patient is a very pleasant 77 year old male that presents today complaining of left knee pain.  He initially had pain about 2 years ago.  Was seen by an orthopedist who told him to treat his knee pain with Biofreeze.  This was effective at the time.  About 4 to 5 months his pain began to return.  He localizes it to the medial and lateral joint line.  It is intermittent.  Associated with some mild swelling.  Worse with weightbearing.  He has purchased a sleeve which has been helpful but he is asking for recommendations for other sleeves that may be more comfortable.  He denies any trauma to the knee.  No prior knee surgeries.  X-rays ordered in March show no significant degenerative changes and nothing acute.  Past medical history reviewed Medications reviewed Allergies reviewed    Review of Systems As above    Objective:   Physical Exam  Well-developed, well-nourished.  No acute distress  Left knee: Good range of motion.  Trace effusion.  He is tender to palpation along medial and lateral joint lines.  Negative McMurray's.  Knee is stable to valgus and varus stressing.  Negative anterior drawer, negative posterior drawer.  Neurovascularly intact distally.  X-ray of the left knee as above      Assessment & Plan:   Left knee pain likely secondary to DJD  I provided the patient with information about body helix compression sleeves.  He will start daily isometric quad and hip abductor strengthening exercises.  Follow-up with me again in 4 weeks.  If symptoms persist at that time, consider cortisone injection and formal physical therapy.  Call with questions or concerns in the interim.  This note was dictated using Dragon naturally speaking software and may contain errors in syntax, spelling, or content which have not been identified prior to signing  this note.

## 2023-03-19 ENCOUNTER — Other Ambulatory Visit: Payer: Self-pay

## 2023-03-19 ENCOUNTER — Other Ambulatory Visit (HOSPITAL_BASED_OUTPATIENT_CLINIC_OR_DEPARTMENT_OTHER): Payer: Self-pay

## 2023-03-19 ENCOUNTER — Other Ambulatory Visit: Payer: Self-pay | Admitting: Family Medicine

## 2023-03-19 MED ORDER — PANTOPRAZOLE SODIUM 40 MG PO TBEC
40.0000 mg | DELAYED_RELEASE_TABLET | Freq: Every day | ORAL | 3 refills | Status: DC
  Filled 2023-03-19: qty 30, 30d supply, fill #0
  Filled 2023-04-21: qty 30, 30d supply, fill #1
  Filled 2023-05-19: qty 30, 30d supply, fill #2
  Filled 2023-06-16: qty 30, 30d supply, fill #3

## 2023-03-19 MED ORDER — TAMSULOSIN HCL 0.4 MG PO CAPS
0.4000 mg | ORAL_CAPSULE | Freq: Every day | ORAL | 1 refills | Status: DC
  Filled 2023-03-19: qty 30, 30d supply, fill #0
  Filled 2023-04-21: qty 30, 30d supply, fill #1

## 2023-03-25 ENCOUNTER — Other Ambulatory Visit (HOSPITAL_BASED_OUTPATIENT_CLINIC_OR_DEPARTMENT_OTHER): Payer: Self-pay

## 2023-03-25 MED ORDER — FLUAD 0.5 ML IM SUSY
0.5000 mL | PREFILLED_SYRINGE | Freq: Once | INTRAMUSCULAR | 0 refills | Status: AC
  Filled 2023-03-25: qty 0.5, 1d supply, fill #0

## 2023-03-26 ENCOUNTER — Ambulatory Visit: Payer: 59 | Admitting: Interventional Cardiology

## 2023-04-15 ENCOUNTER — Ambulatory Visit: Payer: 59 | Admitting: Cardiology

## 2023-04-21 ENCOUNTER — Other Ambulatory Visit: Payer: Self-pay

## 2023-04-21 ENCOUNTER — Other Ambulatory Visit: Payer: Self-pay | Admitting: Family Medicine

## 2023-04-21 ENCOUNTER — Other Ambulatory Visit (HOSPITAL_BASED_OUTPATIENT_CLINIC_OR_DEPARTMENT_OTHER): Payer: Self-pay

## 2023-04-21 DIAGNOSIS — J302 Other seasonal allergic rhinitis: Secondary | ICD-10-CM

## 2023-04-21 DIAGNOSIS — I10 Essential (primary) hypertension: Secondary | ICD-10-CM

## 2023-04-21 MED ORDER — LEVOCETIRIZINE DIHYDROCHLORIDE 5 MG PO TABS
5.0000 mg | ORAL_TABLET | Freq: Every evening | ORAL | 3 refills | Status: DC
  Filled 2023-04-21: qty 30, 30d supply, fill #0
  Filled 2023-05-19: qty 30, 30d supply, fill #1
  Filled 2023-06-16: qty 30, 30d supply, fill #2
  Filled 2023-07-16 – 2023-12-24 (×3): qty 30, 30d supply, fill #3

## 2023-04-21 MED ORDER — HYDRALAZINE HCL 50 MG PO TABS
100.0000 mg | ORAL_TABLET | Freq: Three times a day (TID) | ORAL | 2 refills | Status: DC
  Filled 2023-04-21: qty 180, 30d supply, fill #0
  Filled 2023-05-19: qty 180, 30d supply, fill #1
  Filled 2023-06-16: qty 180, 30d supply, fill #2

## 2023-04-25 ENCOUNTER — Encounter: Payer: Self-pay | Admitting: Family Medicine

## 2023-04-25 ENCOUNTER — Ambulatory Visit (INDEPENDENT_AMBULATORY_CARE_PROVIDER_SITE_OTHER): Payer: 59 | Admitting: Family Medicine

## 2023-04-25 ENCOUNTER — Other Ambulatory Visit (HOSPITAL_BASED_OUTPATIENT_CLINIC_OR_DEPARTMENT_OTHER): Payer: Self-pay

## 2023-04-25 VITALS — BP 136/78 | HR 75 | Temp 98.0°F | Resp 16 | Ht 67.0 in | Wt 162.6 lb

## 2023-04-25 DIAGNOSIS — L308 Other specified dermatitis: Secondary | ICD-10-CM | POA: Diagnosis not present

## 2023-04-25 DIAGNOSIS — R339 Retention of urine, unspecified: Secondary | ICD-10-CM

## 2023-04-25 DIAGNOSIS — M545 Low back pain, unspecified: Secondary | ICD-10-CM | POA: Diagnosis not present

## 2023-04-25 MED ORDER — CLOTRIMAZOLE-BETAMETHASONE 1-0.05 % EX CREA
1.0000 | TOPICAL_CREAM | Freq: Every day | CUTANEOUS | 0 refills | Status: DC
  Filled 2023-04-25: qty 30, 30d supply, fill #0

## 2023-04-25 MED ORDER — PREDNISONE 20 MG PO TABS
40.0000 mg | ORAL_TABLET | Freq: Every day | ORAL | 0 refills | Status: AC
  Filled 2023-04-25: qty 10, 5d supply, fill #0

## 2023-04-25 MED ORDER — METHYLPREDNISOLONE ACETATE 80 MG/ML IJ SUSP
80.0000 mg | Freq: Once | INTRAMUSCULAR | Status: AC
  Administered 2023-04-25: 80 mg via INTRAMUSCULAR

## 2023-04-25 MED ORDER — TIZANIDINE HCL 4 MG PO TABS
4.0000 mg | ORAL_TABLET | Freq: Four times a day (QID) | ORAL | 0 refills | Status: DC | PRN
  Filled 2023-04-25: qty 21, 6d supply, fill #0

## 2023-04-25 NOTE — Patient Instructions (Signed)
Give [place] 2-3 business days to get the results of your urine test back.   Heat (pad or rice pillow in microwave) over affected area, 10-15 minutes twice daily.   Ice/cold pack over area for 10-15 min twice daily.  OK to take Tylenol 1000 mg (2 extra strength tabs) or 975 mg (3 regular strength tabs) every 6 hours as needed.  Take Zanaflex (tizanidine) 1-2 hours before planned bedtime. If it makes you drowsy, do not take during the day. You can try half a tab the following night.  Let us know if you need anything.  EXERCISES  RANGE OF MOTION (ROM) AND STRETCHING EXERCISES - Low Back Pain Most people with lower back pain will find that their symptoms get worse with excessive bending forward (flexion) or arching at the lower back (extension). The exercises that will help resolve your symptoms will focus on the opposite motion.  If you have pain, numbness or tingling which travels down into your buttocks, leg or foot, the goal of the therapy is for these symptoms to move closer to your back and eventually resolve. Sometimes, these leg symptoms will get better, but your lower back pain may worsen. This is often an indication of progress in your rehabilitation. Be very alert to any changes in your symptoms and the activities in which you participated in the 24 hours prior to the change. Sharing this information with your caregiver will allow him or her to most efficiently treat your condition. These exercises may help you when beginning to rehabilitate your injury. Your symptoms may resolve with or without further involvement from your physician, physical therapist or athletic trainer. While completing these exercises, remember:  Restoring tissue flexibility helps normal motion to return to the joints. This allows healthier, less painful movement and activity. An effective stretch should be held for at least 30 seconds. A stretch should never be painful. You should only feel a gentle lengthening or  release in the stretched tissue. FLEXION RANGE OF MOTION AND STRETCHING EXERCISES:  STRETCH - Flexion, Single Knee to Chest  Lie on a firm bed or floor with both legs extended in front of you. Keeping one leg in contact with the floor, bring your opposite knee to your chest. Hold your leg in place by either grabbing behind your thigh or at your knee. Pull until you feel a gentle stretch in your low back. Hold 30 seconds. Slowly release your grasp and repeat the exercise with the opposite side. Repeat 2 times. Complete this exercise 3 times per week.   STRETCH - Flexion, Double Knee to Chest Lie on a firm bed or floor with both legs extended in front of you. Keeping one leg in contact with the floor, bring your opposite knee to your chest. Tense your stomach muscles to support your back and then lift your other knee to your chest. Hold your legs in place by either grabbing behind your thighs or at your knees. Pull both knees toward your chest until you feel a gentle stretch in your low back. Hold 30 seconds. Tense your stomach muscles and slowly return one leg at a time to the floor. Repeat 2 times. Complete this exercise 3 times per week.   STRETCH - Low Trunk Rotation Lie on a firm bed or floor. Keeping your legs in front of you, bend your knees so they are both pointed toward the ceiling and your feet are flat on the floor. Extend your arms out to the side. This will stabilize  your upper body by keeping your shoulders in contact with the floor. Gently and slowly drop both knees together to one side until you feel a gentle stretch in your low back. Hold for 30 seconds. Tense your stomach muscles to support your lower back as you bring your knees back to the starting position. Repeat the exercise to the other side. Repeat 2 times. Complete this exercise at least 3 times per week.   EXTENSION RANGE OF MOTION AND FLEXIBILITY EXERCISES:  STRETCH - Extension, Prone on Elbows  Lie on your  stomach on the floor, a bed will be too soft. Place your palms about shoulder width apart and at the height of your head. Place your elbows under your shoulders. If this is too painful, stack pillows under your chest. Allow your body to relax so that your hips drop lower and make contact more completely with the floor. Hold this position for 30 seconds. Slowly return to lying flat on the floor. Repeat 2 times. Complete this exercise 3 times per week.   RANGE OF MOTION - Extension, Prone Press Ups Lie on your stomach on the floor, a bed will be too soft. Place your palms about shoulder width apart and at the height of your head. Keeping your back as relaxed as possible, slowly straighten your elbows while keeping your hips on the floor. You may adjust the placement of your hands to maximize your comfort. As you gain motion, your hands will come more underneath your shoulders. Hold this position 30 seconds. Slowly return to lying flat on the floor. Repeat 2 times. Complete this exercise 3 times per week.   RANGE OF MOTION- Quadruped, Neutral Spine  Assume a hands and knees position on a firm surface. Keep your hands under your shoulders and your knees under your hips. You may place padding under your knees for comfort. Drop your head and point your tailbone toward the ground below you. This will round out your lower back like an angry cat. Hold this position for 30 seconds. Slowly lift your head and release your tail bone so that your back sags into a large arch, like an old horse. Hold this position for 30 seconds. Repeat this until you feel limber in your low back. Now, find your "sweet spot." This will be the most comfortable position somewhere between the two previous positions. This is your neutral spine. Once you have found this position, tense your stomach muscles to support your low back. Hold this position for 30 seconds. Repeat 2 times. Complete this exercise 3 times per week.    STRENGTHENING EXERCISES - Low Back Sprain These exercises may help you when beginning to rehabilitate your injury. These exercises should be done near your "sweet spot." This is the neutral, low-back arch, somewhere between fully rounded and fully arched, that is your least painful position. When performed in this safe range of motion, these exercises can be used for people who have either a flexion or extension based injury. These exercises may resolve your symptoms with or without further involvement from your physician, physical therapist or athletic trainer. While completing these exercises, remember:  Muscles can gain both the endurance and the strength needed for everyday activities through controlled exercises. Complete these exercises as instructed by your physician, physical therapist or athletic trainer. Increase the resistance and repetitions only as guided. You may experience muscle soreness or fatigue, but the pain or discomfort you are trying to eliminate should never worsen during these exercises. If this  pain does worsen, stop and make certain you are following the directions exactly. If the pain is still present after adjustments, discontinue the exercise until you can discuss the trouble with your caregiver.  STRENGTHENING - Deep Abdominals, Pelvic Tilt  Lie on a firm bed or floor. Keeping your legs in front of you, bend your knees so they are both pointed toward the ceiling and your feet are flat on the floor. Tense your lower abdominal muscles to press your low back into the floor. This motion will rotate your pelvis so that your tail bone is scooping upwards rather than pointing at your feet or into the floor. With a gentle tension and even breathing, hold this position for 3 seconds. Repeat 2 times. Complete this exercise 3 times per week.   STRENGTHENING - Abdominals, Crunches  Lie on a firm bed or floor. Keeping your legs in front of you, bend your knees so they are both  pointed toward the ceiling and your feet are flat on the floor. Cross your arms over your chest. Slightly tip your chin down without bending your neck. Tense your abdominals and slowly lift your trunk high enough to just clear your shoulder blades. Lifting higher can put excessive stress on the lower back and does not further strengthen your abdominal muscles. Control your return to the starting position. Repeat 2 times. Complete this exercise 3 times per week.   STRENGTHENING - Quadruped, Opposite UE/LE Lift  Assume a hands and knees position on a firm surface. Keep your hands under your shoulders and your knees under your hips. You may place padding under your knees for comfort. Find your neutral spine and gently tense your abdominal muscles so that you can maintain this position. Your shoulders and hips should form a rectangle that is parallel with the floor and is not twisted. Keeping your trunk steady, lift your right hand no higher than your shoulder and then your left leg no higher than your hip. Make sure you are not holding your breath. Hold this position for 30 seconds. Continuing to keep your abdominal muscles tense and your back steady, slowly return to your starting position. Repeat with the opposite arm and leg. Repeat 2 times. Complete this exercise 3 times per week.   STRENGTHENING - Abdominals and Quadriceps, Straight Leg Raise  Lie on a firm bed or floor with both legs extended in front of you. Keeping one leg in contact with the floor, bend the other knee so that your foot can rest flat on the floor. Find your neutral spine, and tense your abdominal muscles to maintain your spinal position throughout the exercise. Slowly lift your straight leg off the floor about 6 inches for a count of 3, making sure to not hold your breath. Still keeping your neutral spine, slowly lower your leg all the way to the floor. Repeat this exercise with each leg 2 times. Complete this exercise 3  times per week.  POSTURE AND BODY MECHANICS CONSIDERATIONS - Low Back Sprain Keeping correct posture when sitting, standing or completing your activities will reduce the stress put on different body tissues, allowing injured tissues a chance to heal and limiting painful experiences. The following are general guidelines for improved posture.  While reading these guidelines, remember: The exercises prescribed by your provider will help you have the flexibility and strength to maintain correct postures. The correct posture provides the best environment for your joints to work. All of your joints have less wear and tear when  properly supported by a spine with good posture. This means you will experience a healthier, less painful body. Correct posture must be practiced with all of your activities, especially prolonged sitting and standing. Correct posture is as important when doing repetitive low-stress activities (typing) as it is when doing a single heavy-load activity (lifting).  RESTING POSITIONS Consider which positions are most painful for you when choosing a resting position. If you have pain with flexion-based activities (sitting, bending, stooping, squatting), choose a position that allows you to rest in a less flexed posture. You would want to avoid curling into a fetal position on your side. If your pain worsens with extension-based activities (prolonged standing, working overhead), avoid resting in an extended position such as sleeping on your stomach. Most people will find more comfort when they rest with their spine in a more neutral position, neither too rounded nor too arched. Lying on a non-sagging bed on your side with a pillow between your knees, or on your back with a pillow under your knees will often provide some relief. Keep in mind, being in any one position for a prolonged period of time, no matter how correct your posture, can still lead to stiffness.  PROPER SITTING POSTURE In order  to minimize stress and discomfort on your spine, you must sit with correct posture. Sitting with good posture should be effortless for a healthy body. Returning to good posture is a gradual process. Many people can work toward this most comfortably by using various supports until they have the flexibility and strength to maintain this posture on their own. When sitting with proper posture, your ears will fall over your shoulders and your shoulders will fall over your hips. You should use the back of the chair to support your upper back. Your lower back will be in a neutral position, just slightly arched. You may place a small pillow or folded towel at the base of your lower back for  support.  When working at a desk, create an environment that supports good, upright posture. Without extra support, muscles tire, which leads to excessive strain on joints and other tissues. Keep these recommendations in mind:  CHAIR: A chair should be able to slide under your desk when your back makes contact with the back of the chair. This allows you to work closely. The chair's height should allow your eyes to be level with the upper part of your monitor and your hands to be slightly lower than your elbows.  BODY POSITION Your feet should make contact with the floor. If this is not possible, use a foot rest. Keep your ears over your shoulders. This will reduce stress on your neck and low back.  INCORRECT SITTING POSTURES  If you are feeling tired and unable to assume a healthy sitting posture, do not slouch or slump. This puts excessive strain on your back tissues, causing more damage and pain. Healthier options include: Using more support, like a lumbar pillow. Switching tasks to something that requires you to be upright or walking. Talking a brief walk. Lying down to rest in a neutral-spine position.  PROLONGED STANDING WHILE SLIGHTLY LEANING FORWARD  When completing a task that requires you to lean forward  while standing in one place for a long time, place either foot up on a stationary 2-4 inch high object to help maintain the best posture. When both feet are on the ground, the lower back tends to lose its slight inward curve. If this curve flattens (or  becomes too large), then the back and your other joints will experience too much stress, tire more quickly, and can cause pain.  CORRECT STANDING POSTURES Proper standing posture should be assumed with all daily activities, even if they only take a few moments, like when brushing your teeth. As in sitting, your ears should fall over your shoulders and your shoulders should fall over your hips. You should keep a slight tension in your abdominal muscles to brace your spine. Your tailbone should point down to the ground, not behind your body, resulting in an over-extended swayback posture.   INCORRECT STANDING POSTURES  Common incorrect standing postures include a forward head, locked knees and/or an excessive swayback. WALKING Walk with an upright posture. Your ears, shoulders and hips should all line-up.  PROLONGED ACTIVITY IN A FLEXED POSITION When completing a task that requires you to bend forward at your waist or lean over a low surface, try to find a way to stabilize 3 out of 4 of your limbs. You can place a hand or elbow on your thigh or rest a knee on the surface you are reaching across. This will provide you more stability, so that your muscles do not tire as quickly. By keeping your knees relaxed, or slightly bent, you will also reduce stress across your lower back. CORRECT LIFTING TECHNIQUES  DO : Assume a wide stance. This will provide you more stability and the opportunity to get as close as possible to the object which you are lifting. Tense your abdominals to brace your spine. Bend at the knees and hips. Keeping your back locked in a neutral-spine position, lift using your leg muscles. Lift with your legs, keeping your back straight. Test  the weight of unknown objects before attempting to lift them. Try to keep your elbows locked down at your sides in order get the best strength from your shoulders when carrying an object.   Always ask for help when lifting heavy or awkward objects. INCORRECT LIFTING TECHNIQUES DO NOT:  Lock your knees when lifting, even if it is a small object. Bend and twist. Pivot at your feet or move your feet when needing to change directions. Assume that you can safely pick up even a paperclip without proper posture.

## 2023-04-25 NOTE — Progress Notes (Signed)
Musculoskeletal Exam  Patient: Joshua Thompson DOB: 09-Nov-1945  DOS: 04/25/2023  SUBJECTIVE:  Chief Complaint:   Chief Complaint  Patient presents with   Back Pain    Back pain    Joshua Thompson is a 77 y.o.  male for evaluation and treatment of back pain.   Onset:  3 weeks ago.  [place] uncomfortable [place]enches.   Location: lower Character:  aching  Progression of issue:  is unchanged Associated symptoms: no bruising, swelling, redness Denies bowel/bladder incontinence or weakness Treatment: to date has been: none .   Neurovascular symptoms: no  Itching in groin region for the past couple weeks.  No new lotions, soaps, topicals, or detergents.  No rash, redness, fevers, pain, drainage. Has not tried anything at home. Has not spread.   Urinary retention Had a UTI a couple weeks ago. Pushed fluids and it improved. Still having slow stream. No fevers, bleeding, d/c, freq, urgency.  He denies any constipation or new medications.  He is try to get in with a urologist.  Unfortunately his insurance does not cover many and the ones they do cover are not accepting new patients at this time.  Past Medical History:  Diagnosis Date   Coronary artery disease    GERD (gastroesophageal reflux disease)    Hyperlipidemia    Hypertension     Objective:  VITAL SIGNS: BP 136/78 (BP Location: Left Arm, Patient Position: Sitting, Cuff Size: Normal)   Pulse 75   Temp 98 F (36.7 C) (Oral)   Resp 16   Ht 5\' 7"  (1.702 m)   Wt 162 lb 9.6 oz (73.8 kg)   SpO2 97%   BMI 25.47 kg/m  Constitutional: Well formed, well developed. No acute distress. HENT: Normocephalic, atraumatic.  Heart: RRR, no lower extremity edema Thorax & Lungs:  CTAB. No accessory muscle use Abd: BS+, S, NT, ND Skin: Groin observed without any color change, scaling, erythema, TTP, fluctuance, or drainage. Musculoskeletal: low back.   Tenderness to palpation: Yes, over the lumbar left paraspinal  musculature Deformity: no Ecchymosis: no Straight leg test: negative for Poor hamstring flexibility b/l. Neurologic: Normal sensory function. No focal deficits noted. DTR's equal and symmetric in LE's. No clonus. Psychiatric: Normal mood. Age appropriate judgment and insight. Alert & oriented x 3.    Assessment:  Acute left-sided low back pain without sciatica - Plan: tiZANidine (ZANAFLEX) 4 MG tablet, predniSONE (DELTASONE) 20 MG tablet, methylPREDNISolone acetate (DEPO-MEDROL) injection 80 mg  Pruritic dermatitis - Plan: clotrimazole-betamethasone (LOTRISONE) cream  Urinary retention - Plan: Urine Microscopic Only  Plan: 1.  Depo-Medrol injection today.  Prednisone starting in 2 days if not starting to improve.  Zanaflex as above.  Warnings about drowsiness verbalized and written down.  Stretches/exercises, heat, ice, Tylenol.  Given his chronic renal status, will avoid nonsteroidal anti-inflammatories.  Send message in 3 to 4 weeks if not improving.  Will set him up with physical therapy at that juncture. 2.  Lotrisone cream.  He recently got back from a trip to [place] and wonders if the water there contributed to it.  No obvious cause otherwise.  Avoid scented products.  Try not to scratch. 3.  Check urine today.  If unremarkable, will have him set up with the urology team.  Could consider increasing his Flomax dosage until he gets in. F/u as originally scheduled. The patient voiced understanding and agreement to the plan.  I spent 33 minutes with the patient discussing the above plans in addition  to reviewing his chart on the same day of the visit.   Joshua Roche [place], DO 04/25/23  2:34 PM

## 2023-04-26 ENCOUNTER — Encounter: Payer: Self-pay | Admitting: Family Medicine

## 2023-04-26 ENCOUNTER — Other Ambulatory Visit: Payer: Self-pay | Admitting: Family Medicine

## 2023-04-26 DIAGNOSIS — R339 Retention of urine, unspecified: Secondary | ICD-10-CM

## 2023-04-26 LAB — URINALYSIS, MICROSCOPIC ONLY
Bacteria, UA: NONE SEEN /[HPF]
RBC / HPF: NONE SEEN /[HPF] (ref 0–2)
WBC, UA: NONE SEEN /[HPF] (ref 0–5)

## 2023-04-28 ENCOUNTER — Other Ambulatory Visit: Payer: Self-pay | Admitting: Family Medicine

## 2023-04-28 MED ORDER — FINASTERIDE 5 MG PO TABS: 5.0000 mg | ORAL_TABLET | Freq: Every day | ORAL | 3 refills | Status: DC

## 2023-05-06 ENCOUNTER — Other Ambulatory Visit (HOSPITAL_BASED_OUTPATIENT_CLINIC_OR_DEPARTMENT_OTHER): Payer: Self-pay

## 2023-05-06 ENCOUNTER — Other Ambulatory Visit: Payer: Self-pay | Admitting: Family Medicine

## 2023-05-06 DIAGNOSIS — I10 Essential (primary) hypertension: Secondary | ICD-10-CM

## 2023-05-06 MED ORDER — FINASTERIDE 5 MG PO TABS
5.0000 mg | ORAL_TABLET | Freq: Every day | ORAL | 3 refills | Status: DC
  Filled 2023-05-06: qty 30, 30d supply, fill #0

## 2023-05-06 MED ORDER — CARVEDILOL 25 MG PO TABS
25.0000 mg | ORAL_TABLET | Freq: Two times a day (BID) | ORAL | 1 refills | Status: DC
  Filled 2023-05-06: qty 180, 90d supply, fill #0
  Filled 2023-11-07: qty 180, 90d supply, fill #1

## 2023-05-19 ENCOUNTER — Other Ambulatory Visit (HOSPITAL_BASED_OUTPATIENT_CLINIC_OR_DEPARTMENT_OTHER): Payer: Self-pay

## 2023-05-19 ENCOUNTER — Other Ambulatory Visit: Payer: Self-pay | Admitting: Family Medicine

## 2023-05-19 ENCOUNTER — Other Ambulatory Visit: Payer: Self-pay

## 2023-05-19 MED ORDER — TAMSULOSIN HCL 0.4 MG PO CAPS
0.4000 mg | ORAL_CAPSULE | Freq: Every day | ORAL | 1 refills | Status: DC
  Filled 2023-05-19: qty 30, 30d supply, fill #0
  Filled 2023-06-16: qty 30, 30d supply, fill #1

## 2023-06-12 ENCOUNTER — Other Ambulatory Visit (HOSPITAL_BASED_OUTPATIENT_CLINIC_OR_DEPARTMENT_OTHER): Payer: Self-pay

## 2023-06-20 ENCOUNTER — Other Ambulatory Visit (HOSPITAL_BASED_OUTPATIENT_CLINIC_OR_DEPARTMENT_OTHER): Payer: Self-pay

## 2023-06-25 ENCOUNTER — Ambulatory Visit: Payer: No Typology Code available for payment source | Admitting: Family Medicine

## 2023-06-30 ENCOUNTER — Other Ambulatory Visit (HOSPITAL_BASED_OUTPATIENT_CLINIC_OR_DEPARTMENT_OTHER): Payer: Self-pay

## 2023-07-02 ENCOUNTER — Ambulatory Visit: Payer: 59 | Admitting: Internal Medicine

## 2023-07-03 ENCOUNTER — Ambulatory Visit: Payer: No Typology Code available for payment source | Attending: Internal Medicine | Admitting: Internal Medicine

## 2023-07-03 VITALS — BP 136/63 | HR 70 | Ht 67.0 in | Wt 167.0 lb

## 2023-07-03 DIAGNOSIS — I25118 Atherosclerotic heart disease of native coronary artery with other forms of angina pectoris: Secondary | ICD-10-CM

## 2023-07-03 DIAGNOSIS — I10 Essential (primary) hypertension: Secondary | ICD-10-CM

## 2023-07-03 DIAGNOSIS — N184 Chronic kidney disease, stage 4 (severe): Secondary | ICD-10-CM | POA: Diagnosis not present

## 2023-07-03 DIAGNOSIS — R0609 Other forms of dyspnea: Secondary | ICD-10-CM | POA: Insufficient documentation

## 2023-07-03 NOTE — Progress Notes (Signed)
Cardiology Office Note:  .    Date:  07/03/2023  ID:  Joshua Thompson, DOB Jun 27, 1945, MRN [id number] PCP: Sharlene Dory, DO  [facility] Providers Cardiologist:  Christell Constant, MD     CC: Transition to new cardiologist  History of Present Illness: .    Joshua Thompson is a 78 y.o. male with a history of hypertension, coronary artery disease, and chronic kidney disease (CKD) stage 4, presents with dyspnea on exertion. The patient reports feeling short of breath after walking approximately 20 steps. The patient has a history of coronary artery blockages, for which stents were placed during a procedure in [place]. The patient denies experiencing any chest pain during the blockages but reports occasional mild chest discomfort at night, which may be muscular in nature.  The patient also has a history of bladder issues and prostate problems, which led to urinary retention and significant body swelling. The patient underwent prostate surgery after the placement of two non-medicated stents. Currently, there are no plans for further surgery.  The patient's kidney function is a concern, with creatinine levels consistently between 3.5 to 4.3. The patient's care is managed both in [place] and here. The patient was previously on nebivolol, which was more effective than Coreg for him, but had to discontinue due to cost. The patient is currently on Lasix, taken twice a week as recommended by his nephrologist and previous cardiologist. The patient also takes amlodipine 10mg  daily, which he sometimes splits into two doses.  The patient also reports experiencing pulsatile tinnitus, particularly at night. 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 The patient also has a heart murmur and mild leg swelling.     Relevant histories: .  Social - former Database administrator, from [place], comes with wife ROS: As per HPI.   Studies Reviewed: .   Cardiac Studies & Procedures     STRESS TESTS  MYOCARDIAL PERFUSION IMAGING 11/21/2020  Narrative  Nuclear stress EF: 65%.  The left ventricular ejection fraction is normal (55-65%).  There was no ST segment deviation noted during stress.  No T wave inversion was noted during stress.  Normal myocardial perfusion with no evidence of ischemia or infarction.  The study is normal.  This is a low risk study.  Laurance Flatten, MD  ECHOCARDIOGRAM  ECHOCARDIOGRAM COMPLETE 03/26/2022  Narrative ECHOCARDIOGRAM REPORT    Patient Name:   Joshua Thompson Date of Exam: 03/26/2022 Medical Rec #:  [id number]          Height:       67.0 in Accession #:    [id number]         Weight:       153.0 lb Date of Birth:  [birth year]          BSA:          1.805 m Patient Age:    76 years           BP:           130/60 mmHg Patient Gender: M                  HR:           80 bpm. Exam Location:  [address]  Procedure: 2D Echo, 3D Echo, Cardiac Doppler, Color Doppler and Strain Analysis  Indications:    I25.119 CAD  History:        Patient has no prior history of Echocardiogram examinations. CAD; Risk Factors:Hypertension. CKD stage 4.  Sonographer:    Jorje Guild BS, RDCS Referring Phys: [id number] JAYADEEP S VARANASI  IMPRESSIONS   1. Mildly elevated LVOT velocity (2.2 m/s) but visually aortic valve opens well. 2. Left ventricular ejection fraction, by estimation, is 60 to 65%. The left ventricle has normal function. The left ventricle has no regional wall motion abnormalities. Left ventricular diastolic parameters were normal. The average left ventricular global longitudinal strain is -25.5 %. The global longitudinal strain is normal. 3. Right  ventricular systolic function is normal. The right ventricular size is normal. 4. Left atrial size was mildly dilated. 5. The mitral valve is normal in structure. Mild mitral valve regurgitation. No evidence of mitral stenosis. 6. The aortic valve is tricuspid. Aortic valve regurgitation is mild. Aortic valve sclerosis is present, with no evidence of aortic valve stenosis. 7. The inferior vena cava is normal in size with greater than 50% respiratory variability, suggesting right atrial pressure of 3 mmHg.  Comparison(s): No prior Echocardiogram.  FINDINGS Left Ventricle: Left ventricular ejection fraction, by estimation, is 60 to 65%. The left ventricle has normal function. The left ventricle has no regional wall motion abnormalities. The average left ventricular global longitudinal strain is -25.5 %. The global longitudinal strain is normal. The left ventricular internal cavity size was normal in size. There is no left ventricular hypertrophy. Left ventricular diastolic parameters were normal.  Right Ventricle: The right ventricular size is normal. Right ventricular systolic function is normal.  Left Atrium: Left atrial size was mildly dilated.  Right Atrium: Right atrial size was normal in size.  Pericardium: There is no evidence of pericardial effusion.  Mitral Valve: The mitral valve is normal in structure. Mild mitral valve regurgitation. No evidence of mitral valve stenosis.  Tricuspid Valve: The tricuspid valve is normal in structure. Tricuspid valve regurgitation is trivial. No evidence of tricuspid stenosis.  Aortic Valve: The aortic valve is tricuspid. Aortic valve regurgitation is mild. Aortic regurgitation PHT measures 292 msec. Aortic valve sclerosis is present, with no evidence of aortic valve stenosis. Aortic valve mean gradient measures 11.0 mmHg. Aortic valve peak gradient measures 19.4 mmHg. Aortic valve area, by VTI measures 2.50 cm.  Pulmonic Valve: The pulmonic valve  was normal in structure. Pulmonic valve regurgitation is not visualized. No evidence of pulmonic stenosis.  Aorta: The aortic root is normal in size and structure.  Venous: The inferior vena cava is normal in size with greater than 50% respiratory variability, suggesting right atrial pressure of 3 mmHg.  IAS/Shunts: No atrial level shunt detected by color flow Doppler.  Additional Comments: Mildly elevated LVOT velocity (2.2 m/s) but visually aortic valve opens well.   LEFT VENTRICLE PLAX 2D LVIDd:         4.40 cm   Diastology LVIDs:         2.90 cm   LV e' medial:    10.70 cm/s LV PW:         1.10 cm   LV E/e' medial:  7.8 LV IVS:        1.10 cm   LV e' lateral:   11.00 cm/s LVOT diam:     2.10 cm   LV E/e' lateral: 7.6 LV SV:  114 LV SV Index:   63        2D Longitudinal Strain LVOT Area:     3.46 cm  2D Strain GLS (A2C):   -22.2 % 2D Strain GLS (A3C):   -29.5 % 2D Strain GLS (A4C):   -24.8 % 2D Strain GLS Avg:     -25.5 %  3D Volume EF: 3D EF:        64 % LV EDV:       148 ml LV ESV:       53 ml LV SV:        95 ml  RIGHT VENTRICLE             IVC RV Basal diam:  3.90 cm     IVC diam: 1.60 cm RV S prime:     12.70 cm/s TAPSE (M-mode): 2.5 cm  LEFT ATRIUM             Index        RIGHT ATRIUM           Index LA diam:        4.30 cm 2.38 cm/m   RA Pressure: 3.00 mmHg LA Vol (A2C):   68.7 ml 38.07 ml/m  RA Area:     15.30 cm LA Vol (A4C):   60.4 ml 33.47 ml/m  RA Volume:   37.20 ml  20.61 ml/m LA Biplane Vol: 66.4 ml 36.79 ml/m AORTIC VALVE AV Area (Vmax):    2.36 cm AV Area (Vmean):   2.38 cm AV Area (VTI):     2.50 cm AV Vmax:           220.25 cm/s AV Vmean:          154.500 cm/s AV VTI:            0.457 m AV Peak Grad:      19.4 mmHg AV Mean Grad:      11.0 mmHg LVOT Vmax:         150.00 cm/s LVOT Vmean:        106.150 cm/s LVOT VTI:          0.330 m LVOT/AV VTI ratio: 0.72 AI PHT:            292 msec  AORTA Ao Root diam: 3.20 cm Ao Asc  diam:  3.30 cm  MITRAL VALVE               TRICUSPID VALVE Estimated RAP:  3.00 mmHg MV Decel Time: 222 msec MV E velocity: 83.30 cm/s  SHUNTS MV A velocity: 98.00 cm/s  Systemic VTI:  0.33 m MV E/A ratio:  0.85        Systemic Diam: 2.10 cm  Olga Millers MD Electronically signed by Olga Millers MD Signature Date/Time: 03/26/2022/10:24:02 AM    Final              Physical Exam:    VS:  BP 136/63 (BP Location: Right Arm)   Pulse 70   Ht 5\' 7"  (1.702 m)   Wt 167 lb (75.8 kg)   SpO2 97%   BMI 26.16 kg/m    Wt Readings from Last 3 Encounters:  07/03/23 167 lb (75.8 kg)  04/25/23 162 lb 9.6 oz (73.8 kg)  03/05/23 162 lb (73.5 kg)    Gen: no distress  Neck: No JVD  Ears: Bilateral Homero Fellers Sign Cardiac: No Rubs or Gallops, systolic and diastolic heart  RRR +2 radial pulses Respiratory: Clear to  auscultation bilaterally, normal effort, normal  respiratory rate GI: Soft, nontender, non-distended  MS: trace bilateral edema;  moves all extremities Integument: Skin feels warm Neuro:  At time of evaluation, alert and oriented to person/place/time/situation  Psych: Normal affect, patient feels well   ASSESSMENT AND PLAN: .    Coronary Artery Disease (CAD) CAD with multiple stents, including drug-eluting stents. . No current chest pain but reports dyspnea on exertion.  - Order echocardiogram if symptoms worsen - Monitor for signs of heart failure - Continue current cardiac medications  Dyspnea on Exertion Shortness of breath after walking 20 steps, possibly related to fluid overload or other cardiac issues. Evaluating fluid status and adjusting diuretic therapy. Increased diuretic use could impact kidney function. - Order basic natriuretic peptide - Consider increasing Lasix to three times a week based on lab results - Monitor for worsening symptoms  Hypertension Managed with amlodipine 10 mg daily, sometimes split into two doses. Considering reducing the dose to 5 mg  if fluid levels are normal. - Monitor blood pressure - Consider reducing amlodipine to 5 mg daily based on lab results (normal BNP)  Chronic Kidney Disease (CKD) Stage 4 CKD stage 4 with creatinine levels between 3.5 to 4.3. Not on dialysis. Balancing heart treatment with kidney function preservation. Aggressive heart treatment could worsen kidney function. - Order basic metabolic panel - Order basic natriuretic peptide - Consider increasing Lasix to three times a week based on lab results - Monitor kidney function closely  General Health Maintenance Discussed importance of monitoring symptoms and regular follow-up. - Schedule follow-up in six months - Advise to contact the clinic if symptoms worsen  Follow-up Six months with wife   Riley Lam, MD FASE [facility] Cardiologist [facility]  [address] [place], [address] [place], [place] [address] [phone]  11:49 AM

## 2023-07-03 NOTE — Patient Instructions (Signed)
Medication Instructions:  Your physician recommends that you continue on your current medications as directed. Please refer to the Current Medication list given to you today.  *If you need a refill on your cardiac medications before your next appointment, please call your pharmacy*   Lab Work: BMP, BNP  If you have labs (blood work) drawn today and your tests are completely normal, you will receive your results only by: MyChart Message (if you have MyChart) OR A paper copy in the mail If you have any lab test that is abnormal or we need to change your treatment, we will call you to review the results.   Testing/Procedures: NONE   Follow-Up: At [facility], you and your health needs are our priority.  As part of our continuing mission to provide you with exceptional heart care, we have created designated Provider Care Teams.  These Care Teams include your primary Cardiologist (physician) and Advanced Practice Providers (APPs -  Physician Assistants and Nurse Practitioners) who all work together to provide you with the care you need, when you need it.   Your next appointment:   6 month(s)  Provider:   Christell Constant, MD

## 2023-07-16 ENCOUNTER — Other Ambulatory Visit: Payer: Self-pay

## 2023-07-16 ENCOUNTER — Other Ambulatory Visit: Payer: Self-pay | Admitting: Family Medicine

## 2023-07-16 ENCOUNTER — Other Ambulatory Visit (HOSPITAL_BASED_OUTPATIENT_CLINIC_OR_DEPARTMENT_OTHER): Payer: Self-pay

## 2023-07-16 DIAGNOSIS — I10 Essential (primary) hypertension: Secondary | ICD-10-CM

## 2023-07-16 DIAGNOSIS — I2510 Atherosclerotic heart disease of native coronary artery without angina pectoris: Secondary | ICD-10-CM

## 2023-07-16 MED ORDER — HYDRALAZINE HCL 50 MG PO TABS
100.0000 mg | ORAL_TABLET | Freq: Three times a day (TID) | ORAL | 2 refills | Status: DC
  Filled 2023-07-16 – 2023-11-07 (×2): qty 180, 30d supply, fill #0
  Filled 2023-12-24: qty 180, 30d supply, fill #1
  Filled 2024-02-09: qty 180, 30d supply, fill #2

## 2023-07-16 MED ORDER — PANTOPRAZOLE SODIUM 40 MG PO TBEC
40.0000 mg | DELAYED_RELEASE_TABLET | Freq: Every day | ORAL | 3 refills | Status: DC
  Filled 2023-07-16 – 2023-11-07 (×2): qty 30, 30d supply, fill #0
  Filled 2023-12-24: qty 30, 30d supply, fill #1
  Filled 2024-03-21 – 2024-05-05 (×2): qty 30, 30d supply, fill #2
  Filled 2024-06-15: qty 30, 30d supply, fill #3

## 2023-07-16 MED ORDER — DOXAZOSIN MESYLATE 2 MG PO TABS
2.0000 mg | ORAL_TABLET | Freq: Every day | ORAL | 1 refills | Status: DC
  Filled 2023-07-16: qty 90, 90d supply, fill #0
  Filled 2023-10-14: qty 90, 90d supply, fill #1

## 2023-07-16 MED ORDER — ATORVASTATIN CALCIUM 20 MG PO TABS
20.0000 mg | ORAL_TABLET | Freq: Every day | ORAL | 5 refills | Status: DC
  Filled 2023-07-16 – 2023-11-07 (×2): qty 30, 30d supply, fill #0
  Filled 2023-12-24: qty 30, 30d supply, fill #1
  Filled 2024-02-09: qty 30, 30d supply, fill #2
  Filled 2024-03-21 – 2024-05-05 (×2): qty 30, 30d supply, fill #3
  Filled 2024-06-15: qty 30, 30d supply, fill #4

## 2023-07-16 MED ORDER — TAMSULOSIN HCL 0.4 MG PO CAPS
0.4000 mg | ORAL_CAPSULE | Freq: Every day | ORAL | 1 refills | Status: DC
  Filled 2023-07-16: qty 30, 30d supply, fill #0

## 2023-07-18 ENCOUNTER — Other Ambulatory Visit (HOSPITAL_BASED_OUTPATIENT_CLINIC_OR_DEPARTMENT_OTHER): Payer: Self-pay

## 2023-07-24 ENCOUNTER — Encounter: Payer: Self-pay | Admitting: Internal Medicine

## 2023-07-24 DIAGNOSIS — R0609 Other forms of dyspnea: Secondary | ICD-10-CM

## 2023-07-24 DIAGNOSIS — N184 Chronic kidney disease, stage 4 (severe): Secondary | ICD-10-CM

## 2023-07-25 LAB — BASIC METABOLIC PANEL
BUN/Creatinine Ratio: 12 (calc) (ref 6–22)
BUN: 52 mg/dL — ABNORMAL HIGH (ref 7–25)
CO2: 22 mmol/L (ref 20–32)
Calcium: 9.1 mg/dL (ref 8.6–10.3)
Chloride: 109 mmol/L (ref 98–110)
Creat: 4.36 mg/dL — ABNORMAL HIGH (ref 0.70–1.28)
Glucose, Bld: 114 mg/dL — ABNORMAL HIGH (ref 65–99)
Potassium: 4.6 mmol/L (ref 3.5–5.3)
Sodium: 140 mmol/L (ref 135–146)

## 2023-07-25 LAB — BRAIN NATRIURETIC PEPTIDE: Brain Natriuretic Peptide: 145 pg/mL — ABNORMAL HIGH (ref <100)

## 2023-08-02 ENCOUNTER — Encounter: Payer: Self-pay | Admitting: Internal Medicine

## 2023-08-02 DIAGNOSIS — N184 Chronic kidney disease, stage 4 (severe): Secondary | ICD-10-CM

## 2023-08-02 DIAGNOSIS — R0609 Other forms of dyspnea: Secondary | ICD-10-CM

## 2023-08-21 ENCOUNTER — Other Ambulatory Visit (HOSPITAL_BASED_OUTPATIENT_CLINIC_OR_DEPARTMENT_OTHER): Payer: Self-pay

## 2023-10-24 ENCOUNTER — Other Ambulatory Visit (HOSPITAL_BASED_OUTPATIENT_CLINIC_OR_DEPARTMENT_OTHER): Payer: Self-pay

## 2023-10-24 ENCOUNTER — Encounter: Payer: Self-pay | Admitting: Family Medicine

## 2023-10-24 ENCOUNTER — Telehealth: Payer: Self-pay

## 2023-10-24 ENCOUNTER — Other Ambulatory Visit: Payer: Self-pay

## 2023-10-24 ENCOUNTER — Ambulatory Visit: Admitting: Family Medicine

## 2023-10-24 ENCOUNTER — Other Ambulatory Visit: Payer: Self-pay | Admitting: Family Medicine

## 2023-10-24 ENCOUNTER — Other Ambulatory Visit (HOSPITAL_COMMUNITY): Payer: Self-pay

## 2023-10-24 VITALS — BP 126/68 | HR 77 | Temp 98.0°F | Resp 16 | Ht 67.0 in | Wt 158.0 lb

## 2023-10-24 DIAGNOSIS — R600 Localized edema: Secondary | ICD-10-CM

## 2023-10-24 DIAGNOSIS — L308 Other specified dermatitis: Secondary | ICD-10-CM

## 2023-10-24 DIAGNOSIS — N184 Chronic kidney disease, stage 4 (severe): Secondary | ICD-10-CM

## 2023-10-24 MED ORDER — FUROSEMIDE 40 MG PO TABS
40.0000 mg | ORAL_TABLET | Freq: Every day | ORAL | 3 refills | Status: DC
  Filled 2023-10-24: qty 30, 30d supply, fill #0

## 2023-10-24 MED ORDER — DICLOFENAC SODIUM 2 % EX SOLN
2.0000 | Freq: Two times a day (BID) | CUTANEOUS | 1 refills | Status: DC | PRN
  Filled 2023-10-24: qty 112, fill #0
  Filled 2023-10-24: qty 112, 30d supply, fill #0

## 2023-10-24 MED ORDER — CLOTRIMAZOLE-BETAMETHASONE 1-0.05 % EX CREA
1.0000 | TOPICAL_CREAM | Freq: Every day | CUTANEOUS | 0 refills | Status: DC
  Filled 2023-10-24: qty 30, 30d supply, fill #0

## 2023-10-24 NOTE — Telephone Encounter (Signed)
 Pharmacy Patient Advocate Encounter   Received notification from RX Request Messages that prior authorization for Diclofenac Sodium 2% solution is required/requested.   Insurance verification completed.   The patient is insured through [employer] .   Per test claim: PA required; PA submitted to above mentioned insurance via CoverMyMeds Key/confirmation #/EOC [id number] Status is pending

## 2023-10-24 NOTE — Progress Notes (Signed)
 Chief Complaint  Patient presents with   Foot Swelling    Foot Swelling    Joshua Thompson here for bilateral leg swelling.  Duration: 3 weeks Hx of prolonged bedrest, recent surgery, travel or injury? No Pain the calf? Yes SOB? No Personal or family history of clot or bleeding disorder? No Prolonged bedrest?  Hx of heart failure, renal failure, hepatic failure? Yes  Past Medical History:  Diagnosis Date   Coronary artery disease    GERD (gastroesophageal reflux disease)    Hyperlipidemia    Hypertension    Family History  Problem Relation Age of Onset   Hypertension Mother    Stroke Maternal Grandfather    Arrhythmia Neg Hx    Heart attack Neg Hx    Past Surgical History:  Procedure Laterality Date   CORONARY ANGIOPLASTY     3 STENTS   TRANSURETHRAL RESECTION OF PROSTATE     HAD DONE THREE TIMES    Current Outpatient Medications:    allopurinol  (ZYLOPRIM ) 100 MG tablet, Take 2 tablets (200 mg total) by mouth daily., Disp: 180 tablet, Rfl: 3   allopurinol  (ZYLOPRIM ) 100 MG tablet, Take 2 tablets (200 mg total) by mouth daily., Disp: 180 tablet, Rfl: 3   amLODipine  (NORVASC ) 5 MG tablet, Take 1 tablet (5 mg total) by mouth in the morning and at bedtime., Disp: 180 tablet, Rfl: 3   atorvastatin  (LIPITOR) 20 MG tablet, Take 1 tablet (20 mg total) by mouth daily., Disp: 30 tablet, Rfl: 5   carvedilol  (COREG ) 25 MG tablet, Take 1 tablet (25 mg total) by mouth 2 (two) times daily with a meal., Disp: 180 tablet, Rfl: 1   diclofenac Sodium (PENNSAID) 2 % SOLN, Apply 2 Pump (40 mg total) topically 2 (two) times daily as needed., Disp: 112 g, Rfl: 1   doxazosin  (CARDURA ) 2 MG tablet, Take 1 tablet (2 mg total) by mouth at bedtime., Disp: 90 tablet, Rfl: 1   finasteride  (PROSCAR ) 5 MG tablet, Take 1 tablet (5 mg total) by mouth daily., Disp: 30 tablet, Rfl: 3   fluticasone  (FLONASE ) 50 MCG/ACT nasal spray, Shake bottle and place 2 sprays into both nostrils daily., Disp: 16 g,  Rfl: 6   hydrALAZINE  (APRESOLINE ) 50 MG tablet, Take 2 tablets (100 mg total) by mouth 3 (three) times daily., Disp: 180 tablet, Rfl: 2   isosorbide  mononitrate (IMDUR ) 30 MG 24 hr tablet, Take 1 tablet (30 mg total) by mouth daily., Disp: 90 tablet, Rfl: 3   levocetirizine (XYZAL ) 5 MG tablet, Take 1 tablet (5 mg total) by mouth every evening., Disp: 90 tablet, Rfl: 3   nitroGLYCERIN  (NITROSTAT ) 0.4 MG SL tablet, Place 1 tablet (0.4 mg total) under the tongue every 5 (five) minutes as needed for chest pain., Disp: 25 tablet, Rfl: 3   pantoprazole  (PROTONIX ) 40 MG tablet, Take 1 tablet (40 mg total) by mouth daily., Disp: 30 tablet, Rfl: 3   tamsulosin  (FLOMAX ) 0.4 MG CAPS capsule, Take 1 capsule (0.4 mg total) by mouth daily., Disp: 30 capsule, Rfl: 1   tiZANidine  (ZANAFLEX ) 4 MG tablet, Take 1 tablet (4 mg total) by mouth every 6 (six) hours as needed for muscle spasms., Disp: 21 tablet, Rfl: 0   clotrimazole -betamethasone  (LOTRISONE ) cream, Apply 1 Application topically daily., Disp: 30 g, Rfl: 0   furosemide  (LASIX ) 40 MG tablet, Take 1 tablet (40 mg total) by mouth daily., Disp: 30 tablet, Rfl: 3  BP 126/68 (BP Location: Left Arm, Patient Position: Sitting)   Pulse  77   Temp 98 F (36.7 C) (Oral)   Resp 16   Ht 5\' 7"  (1.702 m)   Wt 158 lb (71.7 kg)   SpO2 97%   BMI 24.75 kg/m  Gen- awake, alert, appears stated age Heart- RRR, 3/6 SEMt, 3+ pitting lower extremity on the left, 2+ LE edema on the right; tapering at the knees Lungs- CTAB, normal effort w/o accessory muscle use MSK- +calf pain on the left Psych: Age appropriate judgment and insight  Bilateral lower extremity edema - Plan: Basic metabolic panel, Uric acid, D-Dimer, Quantitative  Pruritic dermatitis - Plan: clotrimazole -betamethasone  (LOTRISONE ) cream  Stage 4 chronic kidney disease (HCC) - Plan: CBC  Chronic, not controlled.  Check labs.  Compression, mind salt intake, physical activity, elevation recommended.   Restart Lasix  40 mg daily.  Follow-up in 1 week to recheck this.  Given slight pain and increased swelling on the left compared to the right, we will also check a D-dimer. Pt voiced understanding and agreement to the plan.  Shellie Dials [place], DO 10/24/23  7:53 AM  /

## 2023-10-24 NOTE — Patient Instructions (Signed)
Give [place] 2-3 business days to get the results of your labs back.   For the swelling in your lower extremities, be sure to elevate your legs when able, mind the salt intake, stay physically active and consider wearing compression stockings.   Let us know if you need anything.

## 2023-10-25 ENCOUNTER — Emergency Department (HOSPITAL_BASED_OUTPATIENT_CLINIC_OR_DEPARTMENT_OTHER)

## 2023-10-25 ENCOUNTER — Emergency Department (HOSPITAL_BASED_OUTPATIENT_CLINIC_OR_DEPARTMENT_OTHER)
Admission: EM | Admit: 2023-10-25 | Discharge: 2023-10-25 | Disposition: A | Discharge status: Home / Self Care | Attending: Emergency Medicine | Admitting: Emergency Medicine

## 2023-10-25 ENCOUNTER — Other Ambulatory Visit: Payer: Self-pay

## 2023-10-25 ENCOUNTER — Encounter (HOSPITAL_BASED_OUTPATIENT_CLINIC_OR_DEPARTMENT_OTHER): Payer: Self-pay

## 2023-10-25 ENCOUNTER — Encounter: Payer: Self-pay | Admitting: Family Medicine

## 2023-10-25 DIAGNOSIS — I2510 Atherosclerotic heart disease of native coronary artery without angina pectoris: Secondary | ICD-10-CM | POA: Diagnosis not present

## 2023-10-25 DIAGNOSIS — R0602 Shortness of breath: Secondary | ICD-10-CM | POA: Diagnosis not present

## 2023-10-25 DIAGNOSIS — R600 Localized edema: Secondary | ICD-10-CM | POA: Insufficient documentation

## 2023-10-25 DIAGNOSIS — I10 Essential (primary) hypertension: Secondary | ICD-10-CM | POA: Diagnosis not present

## 2023-10-25 DIAGNOSIS — Z79899 Other long term (current) drug therapy: Secondary | ICD-10-CM | POA: Insufficient documentation

## 2023-10-25 LAB — COMPREHENSIVE METABOLIC PANEL WITH GFR
ALT: 9 U/L (ref 0–44)
AST: 16 U/L (ref 15–41)
Albumin: 4 g/dL (ref 3.5–5.0)
Alkaline Phosphatase: 76 U/L (ref 38–126)
Anion gap: 13 (ref 5–15)
BUN: 41 mg/dL — ABNORMAL HIGH (ref 8–23)
CO2: 21 mmol/L — ABNORMAL LOW (ref 22–32)
Calcium: 9.1 mg/dL (ref 8.9–10.3)
Chloride: 104 mmol/L (ref 98–111)
Creatinine, Ser: 3.65 mg/dL — ABNORMAL HIGH (ref 0.61–1.24)
GFR, Estimated: 16 mL/min — ABNORMAL LOW (ref >60)
Glucose, Bld: 182 mg/dL — ABNORMAL HIGH (ref 70–99)
Potassium: 4.7 mmol/L (ref 3.5–5.1)
Sodium: 138 mmol/L (ref 135–145)
Total Bilirubin: 0.4 mg/dL (ref 0.0–1.2)
Total Protein: 6.9 g/dL (ref 6.5–8.1)

## 2023-10-25 LAB — CBC
HCT: 35.1 % — ABNORMAL LOW (ref 38.5–50.0)
HCT: 33.1 % — ABNORMAL LOW (ref 39.0–52.0)
Hemoglobin: 11.2 g/dL — ABNORMAL LOW (ref 13.2–17.1)
Hemoglobin: 10.7 g/dL — ABNORMAL LOW (ref 13.0–17.0)
MCH: 30.9 pg (ref 27.0–33.0)
MCH: 31.7 pg (ref 26.0–34.0)
MCHC: 31.9 g/dL — ABNORMAL LOW (ref 32.0–36.0)
MCHC: 32.3 g/dL (ref 30.0–36.0)
MCV: 96.7 fL (ref 80.0–100.0)
MCV: 97.9 fL (ref 80.0–100.0)
MPV: 9.6 fL (ref 7.5–12.5)
Platelets: 228 10*3/uL (ref 140–400)
Platelets: 204 10*3/uL (ref 150–400)
RBC: 3.63 10*6/uL — ABNORMAL LOW (ref 4.20–5.80)
RBC: 3.38 MIL/uL — ABNORMAL LOW (ref 4.22–5.81)
RDW: 12.6 % (ref 11.0–15.0)
RDW: 13.9 % (ref 11.5–15.5)
WBC: 5.4 10*3/uL (ref 3.8–10.8)
WBC: 5.1 10*3/uL (ref 4.0–10.5)
nRBC: 0 % (ref 0.0–0.2)

## 2023-10-25 LAB — BASIC METABOLIC PANEL WITH GFR
BUN/Creatinine Ratio: 13 (calc) (ref 6–22)
BUN: 44 mg/dL — ABNORMAL HIGH (ref 7–25)
CO2: 24 mmol/L (ref 20–32)
Calcium: 9.3 mg/dL (ref 8.6–10.3)
Chloride: 107 mmol/L (ref 98–110)
Creat: 3.48 mg/dL — ABNORMAL HIGH (ref 0.70–1.28)
Glucose, Bld: 97 mg/dL (ref 65–99)
Potassium: 4.7 mmol/L (ref 3.5–5.3)
Sodium: 139 mmol/L (ref 135–146)
eGFR: 17 mL/min/{1.73_m2} — ABNORMAL LOW (ref >=60)

## 2023-10-25 LAB — TROPONIN T, HIGH SENSITIVITY
Troponin T High Sensitivity: 33 ng/L — ABNORMAL HIGH (ref <19)
Troponin T High Sensitivity: 33 ng/L — ABNORMAL HIGH (ref <19)

## 2023-10-25 LAB — PRO BRAIN NATRIURETIC PEPTIDE: Pro Brain Natriuretic Peptide: 823 pg/mL — ABNORMAL HIGH (ref <300.0)

## 2023-10-25 LAB — D-DIMER, QUANTITATIVE: D-Dimer, Quant: 1.85 ug{FEU}/mL — ABNORMAL HIGH (ref <0.50)

## 2023-10-25 LAB — URIC ACID: Uric Acid, Serum: 5.9 mg/dL (ref 4.0–8.0)

## 2023-10-25 NOTE — ED Triage Notes (Signed)
 Pt called by PCP this am due to elevated D Dimer. Pt ambulatory to room . Reports swelling and pain left lower ext for 2 weeks. SOB worse with exertion  Left lower ext noted to + 3 pitting edema. Palpable pedal foot. Warm to touch

## 2023-10-25 NOTE — ED Notes (Signed)
 [place] at bedside

## 2023-10-25 NOTE — ED Provider Notes (Signed)
 [facility] Provider Note   CSN: [id number] Arrival date & time: 10/25/23  0901     History  Chief Complaint  Patient presents with   Leg Swelling   Abnormal Lab    Joshua Thompson is a 78 y.o. male.   Abnormal Lab Patient presents with swelling in his legs and positive D-dimer.  Had been seen at PCP.  Is had edema for a while now.  Has had shortness of breath also for a while.  Reportedly has been worse the last couple weeks.  No recent travel.  Has had previous cardiac workup and previous cardiac disease.    Past Medical History:  Diagnosis Date   Coronary artery disease    GERD (gastroesophageal reflux disease)    Hyperlipidemia    Hypertension     Home Medications Prior to Admission medications   Medication Sig Start Date End Date Taking? Authorizing Provider  allopurinol  (ZYLOPRIM ) 100 MG tablet Take 2 tablets (200 mg total) by mouth daily. 12/31/21     allopurinol  (ZYLOPRIM ) 100 MG tablet Take 2 tablets (200 mg total) by mouth daily. 11/25/22     amLODipine  (NORVASC ) 5 MG tablet Take 1 tablet (5 mg total) by mouth in the morning and at bedtime. 11/25/22   Jobe Mulder, DO  atorvastatin  (LIPITOR) 20 MG tablet Take 1 tablet (20 mg total) by mouth daily. 07/16/23   Jobe Mulder, DO  carvedilol  (COREG ) 25 MG tablet Take 1 tablet (25 mg total) by mouth 2 (two) times daily with a meal. 05/06/23   Wendling, Shellie Dials, DO  clotrimazole -betamethasone  (LOTRISONE ) cream Apply 1 Application topically daily. 10/24/23   Jobe Mulder, DO  diclofenac Sodium (PENNSAID) 2 % SOLN Apply 2 Pump (40 mg total) topically 2 (two) times daily as needed. 10/24/23   Jobe Mulder, DO  doxazosin  (CARDURA ) 2 MG tablet Take 1 tablet (2 mg total) by mouth at bedtime. 07/16/23   Jobe Mulder, DO  finasteride  (PROSCAR ) 5 MG tablet Take 1 tablet (5 mg total) by mouth daily. 05/06/23   Jobe Mulder, DO  fluticasone  (FLONASE ) 50 MCG/ACT nasal spray Shake bottle and place 2 sprays into both nostrils daily. 06/18/22   Jobe Mulder, DO  furosemide  (LASIX ) 40 MG tablet Take 1 tablet (40 mg total) by mouth daily. 10/24/23   Jobe Mulder, DO  hydrALAZINE  (APRESOLINE ) 50 MG tablet Take 2 tablets (100 mg total) by mouth 3 (three) times daily. 07/16/23   Jobe Mulder, DO  isosorbide  mononitrate (IMDUR ) 30 MG 24 hr tablet Take 1 tablet (30 mg total) by mouth daily. 10/31/22   Lucendia Rusk, MD  levocetirizine (XYZAL ) 5 MG tablet Take 1 tablet (5 mg total) by mouth every evening. 04/21/23   Jobe Mulder, DO  nitroGLYCERIN  (NITROSTAT ) 0.4 MG SL tablet Place 1 tablet (0.4 mg total) under the tongue every 5 (five) minutes as needed for chest pain. 02/10/23   Jobe Mulder, DO  pantoprazole  (PROTONIX ) 40 MG tablet Take 1 tablet (40 mg total) by mouth daily. 07/16/23   Jobe Mulder, DO  tamsulosin  (FLOMAX ) 0.4 MG CAPS capsule Take 1 capsule (0.4 mg total) by mouth daily. 07/16/23   Jobe Mulder, DO  tiZANidine  (ZANAFLEX ) 4 MG tablet Take 1 tablet (4 mg total) by mouth every 6 (six) hours as needed for muscle spasms. 04/25/23   Jobe Mulder, DO      Allergies  Amoxicillin, Amoxicillin-pot clavulanate, and Ampicillin    Review of Systems   Review of Systems  Physical Exam Updated Vital Signs BP (!) 150/71   Pulse 71   Temp 97.9 F (36.6 C)   Resp (!) 21   Wt 72.1 kg   SpO2 100%   BMI 24.90 kg/m  Physical Exam Vitals and nursing note reviewed.  HENT:     Head: Normocephalic.  Pulmonary:     Breath sounds: No wheezing or rhonchi.  Chest:     Chest wall: No tenderness.  Abdominal:     Tenderness: There is no abdominal tenderness.  Musculoskeletal:     Comments: Pitting edema bilateral lower extremities, however worse on the left.  Skin:    General: Skin is warm.  Neurological:     Mental Status: He  is alert and oriented to person, place, and time.     ED Results / Procedures / Treatments   Labs (all labs ordered are listed, but only abnormal results are displayed) Labs Reviewed  COMPREHENSIVE METABOLIC PANEL WITH GFR - Abnormal; Notable for the following components:      Result Value   CO2 21 (*)    Glucose, Bld 182 (*)    BUN 41 (*)    Creatinine, Ser 3.65 (*)    GFR, Estimated 16 (*)    All other components within normal limits  PRO BRAIN NATRIURETIC PEPTIDE - Abnormal; Notable for the following components:   Pro Brain Natriuretic Peptide 823.0 (*)    All other components within normal limits  CBC - Abnormal; Notable for the following components:   RBC 3.38 (*)    Hemoglobin 10.7 (*)    HCT 33.1 (*)    All other components within normal limits  TROPONIN T, HIGH SENSITIVITY - Abnormal; Notable for the following components:   Troponin T High Sensitivity 33 (*)    All other components within normal limits  TROPONIN T, HIGH SENSITIVITY - Abnormal; Notable for the following components:   Troponin T High Sensitivity 33 (*)    All other components within normal limits    EKG EKG Interpretation Date/Time:  Saturday Oct 25 2023 09:24:42 EDT Ventricular Rate:  75 PR Interval:  188 QRS Duration:  99 QT Interval:  425 QTC Calculation: 475 R Axis:   58  Text Interpretation: Sinus rhythm Probable left atrial enlargement RSR' in V1 or V2, right VCD or RVH Confirmed by Mozell Arias [phone]) on 10/25/2023 10:28:51 AM  Radiology US  Venous Img Lower Unilateral Left Result Date: 10/25/2023 CLINICAL DATA:  Left posterior calf pain and swelling for 2 weeks. Elevated D-dimer EXAM: Left LOWER EXTREMITY VENOUS DOPPLER ULTRASOUND TECHNIQUE: Gray-scale sonography with graded compression, as well as color Doppler and duplex ultrasound were performed to evaluate the lower extremity deep venous systems from the level of the common femoral vein and including the common femoral, femoral,  profunda femoral, popliteal and calf veins including the posterior tibial, peroneal and gastrocnemius veins when visible. The superficial great saphenous vein was also interrogated. Spectral Doppler was utilized to evaluate flow at rest and with distal augmentation maneuvers in the common femoral, femoral and popliteal veins. COMPARISON:  None Available. FINDINGS: Contralateral Common Femoral Vein: Respiratory phasicity is normal and symmetric with the symptomatic side. No evidence of thrombus. Normal compressibility. Common Femoral Vein: No evidence of thrombus. Normal compressibility, respiratory phasicity and response to augmentation. Saphenofemoral Junction: No evidence of thrombus. Normal compressibility and flow on color Doppler imaging. Profunda Femoral Vein: No evidence  of thrombus. Normal compressibility and flow on color Doppler imaging. Femoral Vein: No evidence of thrombus. Normal compressibility, respiratory phasicity and response to augmentation. Popliteal Vein: No evidence of thrombus. Normal compressibility, respiratory phasicity and response to augmentation. Calf Veins: No evidence of thrombus. Normal compressibility and flow on color Doppler imaging. Superficial Great Saphenous Vein: No evidence of thrombus. Normal compressibility. Venous Reflux:  None. Other Findings: Fluid collection identified in the lateral aspect of the popliteal fossa measuring 7.0 x 2.3 x 2.9 cm. No abnormal blood flow on Doppler. Probable Baker cyst based on appearance. IMPRESSION: No evidence of left lower extremity DVT. Probable 7 cm Baker's cyst. Please correlate with any known history. Electronically Signed   By: Adrianna Horde M.D.   On: 10/25/2023 11:05   DG Chest Portable 1 View Result Date: 10/25/2023 CLINICAL DATA:  Edema, shortness of breath EXAM: PORTABLE CHEST 1 VIEW COMPARISON:  None Available. FINDINGS: Heart is enlarged. No focal infiltrate. No pleural effusion or pneumothorax. IMPRESSION: 1. Enlarged heart  without overt edema. Electronically Signed   By: Reagan Camera M.D.   On: 10/25/2023 10:39    Procedures Procedures    Medications Ordered in ED Medications - No data to display  ED Course/ Medical Decision Making/ A&P                                 Medical Decision Making Amount and/or Complexity of Data Reviewed Labs: ordered. Radiology: ordered.   Patient with edema  Positive D-dimer.  Swelling of his legs.  Some shortness of breath.  Sent in to rule out DVT.  Reviewed previous cardiology note.  Reviewed PCP note.  Will get x-ray.  Will get blood work and also to get Doppler of left lower extremity.  Doppler only shows potential Baker's cyst.  Creatinine baseline.  Troponin mildly elevated.  Stable.  BNP also elevated.  Cardiology's been adjusting medicines due to both renal function and edema.  Chest x-ray reassuring.  Appears stable for discharge home to follow-up with the PCP and cardiology.       Final Clinical Impression(s) / ED Diagnoses Final diagnoses:  Peripheral edema    Rx / DC Orders ED Discharge Orders          Ordered    Ambulatory referral to Cardiology       Comments: If you have not heard from the Cardiology office within the next 72 hours please call [phone].   10/25/23 1322              Mozell Arias, MD 10/25/23 1421

## 2023-10-27 NOTE — Telephone Encounter (Signed)
 Pharmacy Patient Advocate Encounter  Received notification from [employer] that Prior Authorization for Diclofenac Sodium 2% solution has been DENIED.  No reason given; No denial letter received via Fax or CMM. It has been requested and will be uploaded to the media tab once received.   PA #/Case ID/Reference #: [id number]

## 2023-10-28 ENCOUNTER — Other Ambulatory Visit: Payer: Self-pay

## 2023-10-28 ENCOUNTER — Encounter: Payer: Self-pay | Admitting: Internal Medicine

## 2023-10-28 MED ORDER — DICLOFENAC SODIUM 2 % EX SOLN: 2.0000 | Freq: Two times a day (BID) | CUTANEOUS | 1 refills | Status: DC | PRN

## 2023-10-30 ENCOUNTER — Other Ambulatory Visit (HOSPITAL_BASED_OUTPATIENT_CLINIC_OR_DEPARTMENT_OTHER): Payer: Self-pay

## 2023-10-31 ENCOUNTER — Encounter: Payer: Self-pay | Admitting: Family Medicine

## 2023-10-31 ENCOUNTER — Ambulatory Visit: Admitting: Family Medicine

## 2023-10-31 ENCOUNTER — Other Ambulatory Visit (HOSPITAL_BASED_OUTPATIENT_CLINIC_OR_DEPARTMENT_OTHER): Payer: Self-pay

## 2023-10-31 VITALS — BP 164/68 | HR 74 | Temp 98.0°F | Resp 18 | Ht 67.0 in | Wt 159.0 lb

## 2023-10-31 DIAGNOSIS — N184 Chronic kidney disease, stage 4 (severe): Secondary | ICD-10-CM

## 2023-10-31 DIAGNOSIS — R600 Localized edema: Secondary | ICD-10-CM

## 2023-10-31 DIAGNOSIS — L308 Other specified dermatitis: Secondary | ICD-10-CM

## 2023-10-31 MED ORDER — TRIAMCINOLONE ACETONIDE 0.1 % EX CREA
1.0000 | TOPICAL_CREAM | Freq: Two times a day (BID) | CUTANEOUS | 0 refills | Status: DC
  Filled 2023-10-31: qty 30, 15d supply, fill #0

## 2023-10-31 MED ORDER — METHYLPREDNISOLONE ACETATE 80 MG/ML IJ SUSP
80.0000 mg | Freq: Once | INTRAMUSCULAR | Status: AC
  Administered 2023-10-31: 80 mg via INTRAMUSCULAR

## 2023-10-31 NOTE — Patient Instructions (Addendum)
 Try not to scratch as this can make things worse. Avoid scented products while dealing with this. You may resume when the itchiness resolves. Cold/cool compresses can help.   Take 2 tabs of your Lasix  daily for the next week and let me know how things go.   Send me a message next week updating me on how you are doing.   I am fine with you taking it 3 days per week or daily at this time.   Let us  know if you need anything.

## 2023-10-31 NOTE — Progress Notes (Signed)
 Chief Complaint  Patient presents with   Follow-up    Subjective: Patient is a 78 y.o. male here for follow-up.  Patient was seen 1 week ago for bilateral lower extremity swelling.  D-dimer was elevated and he had left calf pain and increased swelling the left side compared to the right.  Lower extremity duplex was unremarkable.  He continues to have pain and swelling.  It takes longer than 30 minutes to urinate after taking his Lasix , 40 mg daily.  He is not having any side effects of the medication otherwise.  No other recent changes.  Past Medical History:  Diagnosis Date   Coronary artery disease    GERD (gastroesophageal reflux disease)    Hyperlipidemia    Hypertension     Objective: BP (!) 164/68   Pulse 74   Temp 98 F (36.7 C)   Resp 18   Ht 5\' 7"  (1.702 m)   Wt 159 lb (72.1 kg)   SpO2 97%   BMI 24.90 kg/m  General: Awake, appears stated age Heart: RRR, 2+ pitting bilateral lower extremity edema bilaterally tapering at the knees  Lungs: CTAB, no rales, wheezes or rhonchi. No accessory muscle use Skin: Scaly patch of the right trapezius region with excoriation.  No fluctuance, drainage, erythema, TTP, excessive warmth or induration. Psych: Age appropriate judgment and insight, normal affect and mood  Assessment and Plan: Pruritic dermatitis - Plan: triamcinolone  cream (KENALOG ) 0.1 %, methylPREDNISolone  acetate (DEPO-MEDROL ) injection 80 mg  Stage 4 chronic kidney disease (HCC)  Bilateral lower extremity edema  Kenalog  cream twice daily for 7 to 14 days.  Avoid scented products.  Try not to scratch.  Cold compresses could be helpful. 2/3. Probably needs a higher dosage of furosemide .  He will start taking 80 mg daily and we will follow-up accordingly.  He will send a message next week letting us  know how he is doing.  Will recheck potassium levels at that time as well.  Depo-Medrol  injection for pain. The patient voiced understanding and agreement to the  plan.  Shellie Dials Hominy, DO 10/31/23  9:59 AM

## 2023-11-03 ENCOUNTER — Other Ambulatory Visit: Payer: Self-pay

## 2023-11-03 ENCOUNTER — Telehealth: Payer: Self-pay

## 2023-11-03 ENCOUNTER — Other Ambulatory Visit (HOSPITAL_BASED_OUTPATIENT_CLINIC_OR_DEPARTMENT_OTHER): Payer: Self-pay

## 2023-11-03 MED ORDER — DICLOFENAC SODIUM 2 % EX SOLN
2.0000 | Freq: Two times a day (BID) | CUTANEOUS | 1 refills | Status: DC | PRN
  Filled 2023-11-03 (×2): qty 112, 28d supply, fill #0

## 2023-11-03 NOTE — Telephone Encounter (Signed)
 Message sent to pt.

## 2023-11-07 ENCOUNTER — Other Ambulatory Visit (HOSPITAL_BASED_OUTPATIENT_CLINIC_OR_DEPARTMENT_OTHER): Payer: Self-pay

## 2023-11-07 ENCOUNTER — Other Ambulatory Visit: Payer: Self-pay

## 2023-11-12 ENCOUNTER — Other Ambulatory Visit (HOSPITAL_BASED_OUTPATIENT_CLINIC_OR_DEPARTMENT_OTHER): Payer: Self-pay

## 2023-11-17 ENCOUNTER — Other Ambulatory Visit: Payer: Self-pay | Admitting: Family Medicine

## 2023-11-17 ENCOUNTER — Other Ambulatory Visit (HOSPITAL_BASED_OUTPATIENT_CLINIC_OR_DEPARTMENT_OTHER): Payer: Self-pay

## 2023-11-17 MED ORDER — AMLODIPINE BESYLATE 5 MG PO TABS
5.0000 mg | ORAL_TABLET | Freq: Two times a day (BID) | ORAL | 3 refills | Status: AC
  Filled 2023-11-17: qty 180, 90d supply, fill #0
  Filled 2024-02-11: qty 180, 90d supply, fill #1
  Filled 2024-05-05: qty 180, 90d supply, fill #2

## 2023-11-20 ENCOUNTER — Other Ambulatory Visit (HOSPITAL_BASED_OUTPATIENT_CLINIC_OR_DEPARTMENT_OTHER): Payer: Self-pay

## 2023-11-26 ENCOUNTER — Encounter (HOSPITAL_BASED_OUTPATIENT_CLINIC_OR_DEPARTMENT_OTHER): Payer: Self-pay

## 2023-11-26 ENCOUNTER — Encounter: Payer: Self-pay | Admitting: Family Medicine

## 2023-11-26 ENCOUNTER — Other Ambulatory Visit: Payer: Self-pay | Admitting: Family Medicine

## 2023-11-26 ENCOUNTER — Other Ambulatory Visit (HOSPITAL_BASED_OUTPATIENT_CLINIC_OR_DEPARTMENT_OTHER): Payer: Self-pay

## 2023-11-26 MED ORDER — ALLOPURINOL 100 MG PO TABS: 200.0000 mg | ORAL_TABLET | Freq: Every day | ORAL | 3 refills | Status: DC

## 2023-11-26 MED ORDER — FUROSEMIDE 40 MG PO TABS: 40.0000 mg | ORAL_TABLET | Freq: Every day | ORAL | 3 refills | Status: DC

## 2023-11-27 ENCOUNTER — Other Ambulatory Visit (HOSPITAL_BASED_OUTPATIENT_CLINIC_OR_DEPARTMENT_OTHER): Payer: Self-pay

## 2023-11-27 ENCOUNTER — Other Ambulatory Visit: Payer: Self-pay

## 2023-11-27 MED ORDER — ALLOPURINOL 100 MG PO TABS
200.0000 mg | ORAL_TABLET | Freq: Every day | ORAL | 3 refills | Status: AC
Start: 2023-11-27 — End: ?
  Filled 2023-11-27: qty 180, 90d supply, fill #0
  Filled 2024-03-21 – 2024-05-05 (×2): qty 180, 90d supply, fill #1

## 2023-11-27 MED ORDER — FUROSEMIDE 40 MG PO TABS
40.0000 mg | ORAL_TABLET | Freq: Every day | ORAL | 3 refills | Status: AC
  Filled 2023-11-27: qty 30, 30d supply, fill #0

## 2023-11-27 NOTE — Addendum Note (Signed)
 Addended by: Ruthe Coward on: 11/27/2023 08:49 AM   Modules accepted: Orders

## 2023-11-28 ENCOUNTER — Other Ambulatory Visit (HOSPITAL_BASED_OUTPATIENT_CLINIC_OR_DEPARTMENT_OTHER): Payer: Self-pay

## 2023-12-24 ENCOUNTER — Other Ambulatory Visit (HOSPITAL_BASED_OUTPATIENT_CLINIC_OR_DEPARTMENT_OTHER): Payer: Self-pay

## 2023-12-24 ENCOUNTER — Other Ambulatory Visit: Payer: Self-pay | Admitting: Interventional Cardiology

## 2023-12-24 ENCOUNTER — Other Ambulatory Visit: Payer: Self-pay | Admitting: Family Medicine

## 2023-12-24 ENCOUNTER — Other Ambulatory Visit: Payer: Self-pay

## 2023-12-24 DIAGNOSIS — I10 Essential (primary) hypertension: Secondary | ICD-10-CM

## 2023-12-24 MED ORDER — CARVEDILOL 25 MG PO TABS
25.0000 mg | ORAL_TABLET | Freq: Two times a day (BID) | ORAL | 1 refills | Status: AC
  Filled 2023-12-24 – 2024-02-09 (×2): qty 180, 90d supply, fill #0
  Filled 2024-05-06: qty 180, 90d supply, fill #1

## 2023-12-24 MED ORDER — DOXAZOSIN MESYLATE 2 MG PO TABS
2.0000 mg | ORAL_TABLET | Freq: Every day | ORAL | 1 refills | Status: DC
  Filled 2023-12-24 – 2024-01-12 (×2): qty 90, 90d supply, fill #0
  Filled 2024-04-09: qty 90, 90d supply, fill #1

## 2023-12-25 ENCOUNTER — Other Ambulatory Visit (HOSPITAL_BASED_OUTPATIENT_CLINIC_OR_DEPARTMENT_OTHER): Payer: Self-pay

## 2023-12-25 MED ORDER — ISOSORBIDE MONONITRATE ER 30 MG PO TB24
30.0000 mg | ORAL_TABLET | Freq: Every day | ORAL | 2 refills | Status: DC
  Filled 2023-12-25: qty 90, 90d supply, fill #0

## 2023-12-30 ENCOUNTER — Other Ambulatory Visit: Payer: Self-pay

## 2023-12-30 ENCOUNTER — Ambulatory Visit: Attending: Cardiology | Admitting: Internal Medicine

## 2023-12-30 ENCOUNTER — Other Ambulatory Visit (HOSPITAL_BASED_OUTPATIENT_CLINIC_OR_DEPARTMENT_OTHER): Payer: Self-pay

## 2023-12-30 VITALS — BP 159/72 | HR 71 | Ht 67.0 in | Wt 158.0 lb

## 2023-12-30 DIAGNOSIS — I10 Essential (primary) hypertension: Secondary | ICD-10-CM | POA: Diagnosis not present

## 2023-12-30 DIAGNOSIS — I25118 Atherosclerotic heart disease of native coronary artery with other forms of angina pectoris: Secondary | ICD-10-CM

## 2023-12-30 DIAGNOSIS — N184 Chronic kidney disease, stage 4 (severe): Secondary | ICD-10-CM

## 2023-12-30 MED ORDER — ISOSORBIDE MONONITRATE ER 60 MG PO TB24
60.0000 mg | ORAL_TABLET | Freq: Every day | ORAL | 3 refills | Status: AC
  Filled 2023-12-30: qty 90, 90d supply, fill #0
  Filled 2024-03-21: qty 90, 90d supply, fill #1
  Filled 2024-06-15: qty 90, 90d supply, fill #2

## 2023-12-30 MED ORDER — NITROGLYCERIN 0.4 MG SL SUBL
0.4000 mg | SUBLINGUAL_TABLET | SUBLINGUAL | 3 refills | Status: AC | PRN
  Filled 2023-12-30: qty 25, 1d supply, fill #0

## 2023-12-30 NOTE — Patient Instructions (Signed)
 Medication Instructions:  Your physician has recommended you make the following change in your medication:  INCREASE: isosorbide  mononitrate (Imdur ) to 60 mg by mouth once daily  *If you need a refill on your cardiac medications before your next appointment, please call your pharmacy*  Lab Work: NONE  If you have labs (blood work) drawn today and your tests are completely normal, you will receive your results only by: MyChart Message (if you have MyChart) OR A paper copy in the mail If you have any lab test that is abnormal or we need to change your treatment, we will call you to review the results.  Testing/Procedures: Please send in or call with information regarding Echocardiogram.  Follow-Up: At [facility], you and your health needs are our priority.  As part of our continuing mission to provide you with exceptional heart care, our providers are all part of one team.  This team includes your primary Cardiologist (physician) and Advanced Practice Providers or APPs (Physician Assistants and Nurse Practitioners) who all work together to provide you with the care you need, when you need it.  Your next appointment:   6 month(s)  Provider:   One of our Advanced Practice Providers (APPs): Morse Clause, PA-C  Lamarr Satterfield, NP Miriam Shams, NP  Olivia Pavy, PA-C Josefa Beauvais, NP  Leontine Salen, PA-C Orren Fabry, PA-C  [place], [place] Jackee Alberts, NP  Damien Braver, NP Jon Hails, PA-C  Waddell Donath, PA-C    Dayna Dunn, PA-C  Glendia Ferrier, PA-C Lum Louis, NP Katlyn West, NP Callie Goodrich, PA-C  Evan Williams, PA-C Sheng Haley, PA-C  Xika Zhao, NP Kathleen Johnson, PA-C

## 2023-12-30 NOTE — Progress Notes (Signed)
 Cardiology Office Note:  .    Date:  12/30/2023  ID:  Joshua Thompson, DOB August 06, 1945, MRN [id number] PCP: Frann Mabel Mt, DO  [facility] Providers Cardiologist:  Stanly DELENA Leavens, MD     CC: Follow up cardiac testing  History of Present Illness: .    Tran Arzuaga is a 78 year old male with coronary artery disease, hypertension, and chronic kidney disease stage four who presents with shortness of breath and follow-up on valve disease.  He experiences shortness of breath, which was previously discussed in relation to adjusting his Lasix  dosage. He has been taking Lasix  three times a week, but there is concern about fluid retention and its impact on his kidneys. He acknowledges swelling, particularly in the legs.  He has a history of coronary artery disease and is currently on multiple medications for hypertension, including amlodipine , maximum dose hydralazine , and high dose carvedilol . He is also on isosorbide  mononitrate, which he tolerates well.  He has chronic kidney disease stage four with a GFR of approximately 14-15%. He is concerned about the progression to dialysis and is cautious about medication adjustments that might worsen his kidney function.  He has a history of valve disease and underwent an echocardiogram to assess the severity of the condition. However, he has not received the results and is unsure of the details surrounding the test. He recalls having an echocardiogram done at [facility] but has not seen the report.  He experienced a recent emergency department visit due to swelling and was evaluated for a possible blood clot, which was ruled out. Blood work showed elevated BNP levels.  He is dealing with significant personal stress following the recent death of his son, which has impacted his ability to follow through with medical appointments and tests.     Relevant histories: .  Social - former Database administrator, from [place],  comes with wife, son passed suddenly in 2025. ROS: As per HPI.   Studies Reviewed: .   Cardiac Studies & Procedures   ______________________________________________________________________________________________   STRESS TESTS  MYOCARDIAL PERFUSION IMAGING 11/21/2020  Interpretation Summary  Nuclear stress EF: 65%.  The left ventricular ejection fraction is normal (55-65%).  There was no ST segment deviation noted during stress.  No T wave inversion was noted during stress.  Normal myocardial perfusion with no evidence of ischemia or infarction.  The study is normal.  This is a low risk study.  Powell Sorrow, MD   ECHOCARDIOGRAM  ECHOCARDIOGRAM COMPLETE 03/26/2022  Narrative ECHOCARDIOGRAM REPORT    Patient Name:   Joshua Thompson Date of Exam: 03/26/2022 Medical Rec #:  [id number]          Height:       67.0 in Accession #:    [id number]         Weight:       153.0 lb Date of Birth:  08-24-1945          BSA:          1.805 m Patient Age:    78 years           BP:           130/60 mmHg Patient Gender: M                  HR:           80 bpm. Exam Location:  [address]  Procedure: 2D Echo, 3D Echo, Cardiac Doppler, Color Doppler and Strain Analysis  Indications:    I25.119 CAD  History:        Patient has no prior history of Echocardiogram examinations. CAD; Risk Factors:Hypertension. CKD stage 4.  Sonographer:    Marshia Lawyer BS, RDCS Referring Phys: [id number] JAYADEEP S VARANASI  IMPRESSIONS   1. Mildly elevated LVOT velocity (2.2 m/s) but visually aortic valve opens well. 2. Left ventricular ejection fraction, by estimation, is 60 to 65%. The left ventricle has normal function. The left ventricle has no regional wall motion abnormalities. Left ventricular diastolic parameters were normal. The average left ventricular global longitudinal strain is -25.5 %. The global longitudinal strain is normal. 3. Right ventricular systolic function is normal. The  right ventricular size is normal. 4. Left atrial size was mildly dilated. 5. The mitral valve is normal in structure. Mild mitral valve regurgitation. No evidence of mitral stenosis. 6. The aortic valve is tricuspid. Aortic valve regurgitation is mild. Aortic valve sclerosis is present, with no evidence of aortic valve stenosis. 7. The inferior vena cava is normal in size with greater than 50% respiratory variability, suggesting right atrial pressure of 3 mmHg.  Comparison(s): No prior Echocardiogram.  FINDINGS Left Ventricle: Left ventricular ejection fraction, by estimation, is 60 to 65%. The left ventricle has normal function. The left ventricle has no regional wall motion abnormalities. The average left ventricular global longitudinal strain is -25.5 %. The global longitudinal strain is normal. The left ventricular internal cavity size was normal in size. There is no left ventricular hypertrophy. Left ventricular diastolic parameters were normal.  Right Ventricle: The right ventricular size is normal. Right ventricular systolic function is normal.  Left Atrium: Left atrial size was mildly dilated.  Right Atrium: Right atrial size was normal in size.  Pericardium: There is no evidence of pericardial effusion.  Mitral Valve: The mitral valve is normal in structure. Mild mitral valve regurgitation. No evidence of mitral valve stenosis.  Tricuspid Valve: The tricuspid valve is normal in structure. Tricuspid valve regurgitation is trivial. No evidence of tricuspid stenosis.  Aortic Valve: The aortic valve is tricuspid. Aortic valve regurgitation is mild. Aortic regurgitation PHT measures 292 msec. Aortic valve sclerosis is present, with no evidence of aortic valve stenosis. Aortic valve mean gradient measures 11.0 mmHg. Aortic valve peak gradient measures 19.4 mmHg. Aortic valve area, by VTI measures 2.50 cm.  Pulmonic Valve: The pulmonic valve was normal in structure. Pulmonic valve  regurgitation is not visualized. No evidence of pulmonic stenosis.  Aorta: The aortic root is normal in size and structure.  Venous: The inferior vena cava is normal in size with greater than 50% respiratory variability, suggesting right atrial pressure of 3 mmHg.  IAS/Shunts: No atrial level shunt detected by color flow Doppler.  Additional Comments: Mildly elevated LVOT velocity (2.2 m/s) but visually aortic valve opens well.   LEFT VENTRICLE PLAX 2D LVIDd:         4.40 cm   Diastology LVIDs:         2.90 cm   LV e' medial:    10.70 cm/s LV PW:         1.10 cm   LV E/e' medial:  7.8 LV IVS:        1.10 cm   LV e' lateral:   11.00 cm/s LVOT diam:     2.10 cm   LV E/e' lateral: 7.6 LV SV:         114 LV SV Index:   63  2D Longitudinal Strain LVOT Area:     3.46 cm  2D Strain GLS (A2C):   -22.2 % 2D Strain GLS (A3C):   -29.5 % 2D Strain GLS (A4C):   -24.8 % 2D Strain GLS Avg:     -25.5 %  3D Volume EF: 3D EF:        64 % LV EDV:       148 ml LV ESV:       53 ml LV SV:        95 ml  RIGHT VENTRICLE             IVC RV Basal diam:  3.90 cm     IVC diam: 1.60 cm RV S prime:     12.70 cm/s TAPSE (M-mode): 2.5 cm  LEFT ATRIUM             Index        RIGHT ATRIUM           Index LA diam:        4.30 cm 2.38 cm/m   RA Pressure: 3.00 mmHg LA Vol (A2C):   68.7 ml 38.07 ml/m  RA Area:     15.30 cm LA Vol (A4C):   60.4 ml 33.47 ml/m  RA Volume:   37.20 ml  20.61 ml/m LA Biplane Vol: 66.4 ml 36.79 ml/m AORTIC VALVE AV Area (Vmax):    2.36 cm AV Area (Vmean):   2.38 cm AV Area (VTI):     2.50 cm AV Vmax:           220.25 cm/s AV Vmean:          154.500 cm/s AV VTI:            0.457 m AV Peak Grad:      19.4 mmHg AV Mean Grad:      11.0 mmHg LVOT Vmax:         150.00 cm/s LVOT Vmean:        106.150 cm/s LVOT VTI:          0.330 m LVOT/AV VTI ratio: 0.72 AI PHT:            292 msec  AORTA Ao Root diam: 3.20 cm Ao Asc diam:  3.30 cm  MITRAL VALVE                TRICUSPID VALVE Estimated RAP:  3.00 mmHg MV Decel Time: 222 msec MV E velocity: 83.30 cm/s  SHUNTS MV A velocity: 98.00 cm/s  Systemic VTI:  0.33 m MV E/A ratio:  0.85        Systemic Diam: 2.10 cm  Redell Shallow MD Electronically signed by Redell Shallow MD Signature Date/Time: 03/26/2022/10:24:02 AM    Final          ______________________________________________________________________________________________       Physical Exam:    VS:  BP (!) 159/72 (BP Location: Right Arm)   Pulse 71   Ht 5' 7 (1.702 m)   Wt 158 lb (71.7 kg)   SpO2 97%   BMI 24.75 kg/m    Wt Readings from Last 3 Encounters:  12/30/23 158 lb (71.7 kg)  10/31/23 159 lb (72.1 kg)  10/25/23 159 lb (72.1 kg)    Gen: no distress  Neck: No JVD Soft right bruit Ears: Bilateral Dempsey Sign Cardiac: No Rubs or Gallops, systolic and diastolic heart  RRR +2 radial pulses Respiratory: Clear to auscultation bilaterally, normal effort, normal  respiratory rate GI: Soft,  nontender, non-distended  MS: +1 bilateral edema;  moves all extremities Integument: Skin feels warm Neuro:  At time of evaluation, alert and oriented to person/place/time/situation  Psych: Depressed affect, patient feels fair   ASSESSMENT AND PLAN: .    Coronary artery disease Coronary artery disease with associated symptoms of dyspnea. Previous echocardiogram results are unavailable. - Retrieve previous echocardiogram results from [facility] it is not in our system; I have asked out team to review this to find hsi imaging - Increase isosorbide  mononitrate from 30 mg to 60 mg daily  Heart valve disorder Heart valve disorder with previous echocardiogram indicating mild valve disease. Current severity is unknown due to lack of recent echocardiogram results. - Order echocardiogram to assess current status of heart valve disorder  Hypertension Hypertension is not well controlled despite being on amlodipine , maximum dose  hydralazine , and high dose carvedilol . Blood pressure remains elevated. - Increase isosorbide  mononitrate to 60 mg daily to aid in blood pressure control  Chronic kidney disease, stage 4 Chronic kidney disease stage 4 with a GFR of approximately 14-15%. Lasix  management is complicated by kidney function, and there is a need to balance fluid management with kidney health. He is resistant to dialysis. - Coordinate with nephrologist for Lasix  management (send note to [facility]) - Consider increasing Lasix  frequency to every other day if edema worsens (I recommend daily but this will put him closer to dialysis)  Edema Edema is present, likely related to heart and kidney issues. Current Lasix  regimen is insufficient to fully manage edema due to kidney function limitations. - Consider increasing Lasix  frequency to every other day if edema worsens  Right subclavian stenosis - Continue aggressive prevention measures to prevent progression may repeat testing; presently given CKD we would not intervene until he start HD  Six months with me team   Stanly Leavens, MD FASE [facility] Cardiologist [facility]  [address], [address] [place], [place] [address] [phone]  1:13 PM

## 2024-01-02 ENCOUNTER — Other Ambulatory Visit (HOSPITAL_BASED_OUTPATIENT_CLINIC_OR_DEPARTMENT_OTHER): Payer: Self-pay

## 2024-01-12 ENCOUNTER — Other Ambulatory Visit (HOSPITAL_BASED_OUTPATIENT_CLINIC_OR_DEPARTMENT_OTHER): Payer: Self-pay

## 2024-01-19 ENCOUNTER — Other Ambulatory Visit (HOSPITAL_BASED_OUTPATIENT_CLINIC_OR_DEPARTMENT_OTHER): Payer: Self-pay

## 2024-01-21 ENCOUNTER — Other Ambulatory Visit: Payer: Self-pay | Admitting: Family Medicine

## 2024-01-21 ENCOUNTER — Other Ambulatory Visit (HOSPITAL_BASED_OUTPATIENT_CLINIC_OR_DEPARTMENT_OTHER): Payer: Self-pay

## 2024-01-21 DIAGNOSIS — L308 Other specified dermatitis: Secondary | ICD-10-CM

## 2024-01-21 MED ORDER — CLOTRIMAZOLE-BETAMETHASONE 1-0.05 % EX CREA
1.0000 | TOPICAL_CREAM | Freq: Every day | CUTANEOUS | 0 refills | Status: DC
  Filled 2024-01-21: qty 30, 30d supply, fill #0

## 2024-02-09 ENCOUNTER — Other Ambulatory Visit (HOSPITAL_BASED_OUTPATIENT_CLINIC_OR_DEPARTMENT_OTHER): Payer: Self-pay

## 2024-02-09 ENCOUNTER — Other Ambulatory Visit: Payer: Self-pay

## 2024-02-11 ENCOUNTER — Other Ambulatory Visit: Payer: Self-pay

## 2024-02-11 ENCOUNTER — Other Ambulatory Visit (HOSPITAL_BASED_OUTPATIENT_CLINIC_OR_DEPARTMENT_OTHER): Payer: Self-pay

## 2024-02-27 ENCOUNTER — Encounter: Payer: Self-pay | Admitting: Family Medicine

## 2024-02-27 ENCOUNTER — Other Ambulatory Visit (HOSPITAL_BASED_OUTPATIENT_CLINIC_OR_DEPARTMENT_OTHER): Payer: Self-pay

## 2024-02-27 ENCOUNTER — Ambulatory Visit (INDEPENDENT_AMBULATORY_CARE_PROVIDER_SITE_OTHER): Admitting: Family Medicine

## 2024-02-27 VITALS — BP 138/64 | HR 54 | Resp 18 | Ht 67.0 in | Wt 155.6 lb

## 2024-02-27 DIAGNOSIS — R300 Dysuria: Secondary | ICD-10-CM | POA: Diagnosis not present

## 2024-02-27 DIAGNOSIS — Z0000 Encounter for general adult medical examination without abnormal findings: Secondary | ICD-10-CM

## 2024-02-27 DIAGNOSIS — R3912 Poor urinary stream: Secondary | ICD-10-CM | POA: Diagnosis not present

## 2024-02-27 DIAGNOSIS — L308 Other specified dermatitis: Secondary | ICD-10-CM | POA: Diagnosis not present

## 2024-02-27 DIAGNOSIS — G8929 Other chronic pain: Secondary | ICD-10-CM

## 2024-02-27 DIAGNOSIS — Z0001 Encounter for general adult medical examination with abnormal findings: Secondary | ICD-10-CM

## 2024-02-27 DIAGNOSIS — M25562 Pain in left knee: Secondary | ICD-10-CM

## 2024-02-27 DIAGNOSIS — M545 Low back pain, unspecified: Secondary | ICD-10-CM | POA: Diagnosis not present

## 2024-02-27 DIAGNOSIS — M79672 Pain in left foot: Secondary | ICD-10-CM

## 2024-02-27 LAB — COMPREHENSIVE METABOLIC PANEL WITH GFR
AG Ratio: 1.5 (calc) (ref 1.0–2.5)
ALT: 10 U/L (ref 9–46)
AST: 13 U/L (ref 10–35)
Albumin: 4.1 g/dL (ref 3.6–5.1)
Alkaline phosphatase (APISO): 55 U/L (ref 35–144)
BUN/Creatinine Ratio: 11 (calc) (ref 6–22)
BUN: 43 mg/dL — ABNORMAL HIGH (ref 7–25)
CO2: 22 mmol/L (ref 20–32)
Calcium: 9.2 mg/dL (ref 8.6–10.3)
Chloride: 109 mmol/L (ref 98–110)
Creat: 3.78 mg/dL — ABNORMAL HIGH (ref 0.70–1.28)
Globulin: 2.8 g/dL (ref 1.9–3.7)
Glucose, Bld: 98 mg/dL (ref 65–99)
Potassium: 4.8 mmol/L (ref 3.5–5.3)
Sodium: 140 mmol/L (ref 135–146)
Total Bilirubin: 0.5 mg/dL (ref 0.2–1.2)
Total Protein: 6.9 g/dL (ref 6.1–8.1)
eGFR: 16 mL/min/1.73m2 — ABNORMAL LOW (ref >=60)

## 2024-02-27 LAB — CBC
HCT: 34 % — ABNORMAL LOW (ref 38.5–50.0)
Hemoglobin: 10.9 g/dL — ABNORMAL LOW (ref 13.2–17.1)
MCH: 31.2 pg (ref 27.0–33.0)
MCHC: 32.1 g/dL (ref 32.0–36.0)
MCV: 97.4 fL (ref 80.0–100.0)
MPV: 10.2 fL (ref 7.5–12.5)
Platelets: 187 Thousand/uL (ref 140–400)
RBC: 3.49 Million/uL — ABNORMAL LOW (ref 4.20–5.80)
RDW: 12 % (ref 11.0–15.0)
WBC: 7.3 Thousand/uL (ref 3.8–10.8)

## 2024-02-27 LAB — LIPID PANEL
Cholesterol: 133 mg/dL (ref <200)
HDL: 60 mg/dL (ref >=40)
LDL Cholesterol (Calc): 58 mg/dL
Non-HDL Cholesterol (Calc): 73 mg/dL (ref <130)
Total CHOL/HDL Ratio: 2.2 (calc) (ref <5.0)
Triglycerides: 70 mg/dL (ref <150)

## 2024-02-27 MED ORDER — DICLOFENAC SODIUM 3 % EX GEL
CUTANEOUS | 5 refills | Status: AC
  Filled 2024-02-27: qty 100, 15d supply, fill #0
  Filled 2024-03-09 – 2024-03-21 (×4): qty 100, 15d supply, fill #1
  Filled 2024-05-05: qty 100, 15d supply, fill #2
  Filled 2024-05-17: qty 100, 15d supply, fill #3

## 2024-02-27 MED ORDER — TRIAMCINOLONE ACETONIDE 0.1 % EX CREA
1.0000 | TOPICAL_CREAM | Freq: Two times a day (BID) | CUTANEOUS | 0 refills | Status: DC
  Filled 2024-02-27: qty 30, 15d supply, fill #0

## 2024-02-27 MED ORDER — DICLOFENAC SODIUM 2 % EX SOLN
2.0000 | Freq: Two times a day (BID) | CUTANEOUS | 1 refills | Status: DC | PRN
  Filled 2024-02-27: qty 112, 30d supply, fill #0

## 2024-02-27 MED ORDER — METHYLPREDNISOLONE ACETATE 80 MG/ML IJ SUSP
80.0000 mg | Freq: Once | INTRAMUSCULAR | Status: AC
  Administered 2024-02-27: 80 mg via INTRAMUSCULAR

## 2024-02-27 NOTE — Patient Instructions (Addendum)
 Give us  2-3 business days to get the results of your labs back.   Please get me a copy of your advanced directive form at your convenience.   Keep the diet clean and stay active.  If you do not hear anything about your referral in the next 1-2 weeks, call our office and ask for an update.  I recommend getting the flu shot in mid October. This suggestion would change if the CDC comes out with a different recommendation.   Send me a message if you are not feeling better after the shot.   Give the heel issue a few more weeks nad let me know if still bothersome.   Let us  know if you need anything.

## 2024-02-27 NOTE — Progress Notes (Signed)
 Chief Complaint  Patient presents with   Back Pain   Knee Pain    Left -- pain with movement - numbness    Well Male Joshua Thompson is here for a complete physical.   His last physical was >1 year ago.  Current diet: in general, a healthy diet.   Current exercise: leg exercises, stretching Weight trend: stable Fatigue out of ordinary? No. Seat belt? Yes.   Advanced directive? No  Health maintenance Shingrix- Yes Tetanus- Yes Hep C- Yes Pneumonia vaccine- Yes  Chronic back pain Flared up 10 d ago. Has CKD4 and does not take NSAIDs. No obvious inj or change in activity. No bowel/bladder incontinence, neuro s/s's. Slightly better but still bothersome. No bruising, redness, swelling.  Home stretches and exercises are helpful.  He has been to physical therapy before but he states it made things worse.  He also has associated left knee pain that is chronic.  Topical diclofenac  has been helpful for his knee in the past.  Over the past few weeks he has had left heel pain.  No injury or change in activity.  No bruising, redness, swelling.  He does have some support inserts that he puts in his shoes.  He does not have the pain unless he walks upstairs barefoot.  Past Medical History:  Diagnosis Date   Coronary artery disease    GERD (gastroesophageal reflux disease)    Hyperlipidemia    Hypertension      Past Surgical History:  Procedure Laterality Date   CORONARY ANGIOPLASTY     3 STENTS   TRANSURETHRAL RESECTION OF PROSTATE     HAD DONE THREE TIMES    Medications  Current Outpatient Medications on File Prior to Visit  Medication Sig Dispense Refill   allopurinol  (ZYLOPRIM ) 100 MG tablet Take 2 tablets (200 mg total) by mouth daily. 180 tablet 3   amLODipine  (NORVASC ) 5 MG tablet Take 1 tablet (5 mg total) by mouth in the morning and at bedtime. 180 tablet 3   atorvastatin  (LIPITOR) 20 MG tablet Take 1 tablet (20 mg total) by mouth daily. 30 tablet 5   carvedilol   (COREG ) 25 MG tablet Take 1 tablet (25 mg total) by mouth 2 (two) times daily with a meal. 180 tablet 1   clotrimazole -betamethasone  (LOTRISONE ) cream Apply 1 Application topically daily. 30 g 0   doxazosin  (CARDURA ) 2 MG tablet Take 1 tablet (2 mg total) by mouth at bedtime. 90 tablet 1   fluticasone  (FLONASE ) 50 MCG/ACT nasal spray Shake bottle and place 2 sprays into both nostrils daily. 16 g 6   furosemide  (LASIX ) 40 MG tablet Take 1 tablet (40 mg total) by mouth daily. 30 tablet 3   hydrALAZINE  (APRESOLINE ) 50 MG tablet Take 2 tablets (100 mg total) by mouth 3 (three) times daily. 180 tablet 2   isosorbide  mononitrate (IMDUR ) 60 MG 24 hr tablet Take 1 tablet (60 mg total) by mouth daily. 90 tablet 3   levocetirizine (XYZAL ) 5 MG tablet Take 1 tablet (5 mg total) by mouth every evening. 90 tablet 3   nitroGLYCERIN  (NITROSTAT ) 0.4 MG SL tablet Place 1 tablet (0.4 mg total) under the tongue every 5 (five) minutes as needed for chest pain. 25 tablet 3   pantoprazole  (PROTONIX ) 40 MG tablet Take 1 tablet (40 mg total) by mouth daily. 30 tablet 3   tamsulosin  (FLOMAX ) 0.4 MG CAPS capsule Take 1 capsule (0.4 mg total) by mouth daily. 30 capsule 1   tiZANidine  (ZANAFLEX ) 4  MG tablet Take 1 tablet (4 mg total) by mouth every 6 (six) hours as needed for muscle spasms. 21 tablet 0   traMADol (ULTRAM) 50 MG tablet as needed for moderate pain (pain score 4-6).     Allergies Allergies  Allergen Reactions   Amoxicillin Rash   Amoxicillin-Pot Clavulanate Rash   Ampicillin Rash    Family History Family History  Problem Relation Age of Onset   Hypertension Mother    Stroke Maternal Grandfather    Arrhythmia Neg Hx    Heart attack Neg Hx     Review of Systems: Constitutional:  no fevers Eye:  no recent significant change in vision Ears:  No changes in hearing Nose/Mouth/Throat:  no complaints of nasal congestion, no sore throat Cardiovascular: no chest pain Respiratory:  No shortness of  breath Gastrointestinal:  No change in bowel habits GU:  No frequency, + dysuria Integumentary:  no abnormal skin lesions reported Neurologic:  no headaches Endocrine:  denies unexplained weight changes  Exam BP 138/64 (BP Location: Left Arm, Cuff Size: Normal)   Pulse (!) 54   Resp 18   Ht 5' 7 (1.702 m)   Wt 155 lb 9.6 oz (70.6 kg)   SpO2 95%   BMI 24.37 kg/m  General:  well developed, well nourished, in no apparent distress Skin:  no significant moles, warts, or growths Head:  no masses, lesions, or tenderness Eyes:  pupils equal and round, sclera anicteric without injection Ears:  canals without lesions, TMs shiny without retraction, no obvious effusion, no erythema Nose:  nares patent, mucosa normal Throat/Pharynx:  lips and gingiva without lesion; tongue and uvula midline; non-inflamed pharynx; no exudates or postnasal drainage Lungs:  clear to auscultation, breath sounds equal bilaterally, no respiratory distress Cardio:  regular rate and rhythm, no LE edema or bruits Rectal: Deferred GI: BS+, S, NT, ND, no masses or organomegaly Musculoskeletal: TTP over the lumbar paraspinal musculature bilaterally.  No deformity.  Poor hamstring flexibility bilaterally. Neuro:  gait antalgic, negative straight leg bilaterally; deep tendon reflexes normal and symmetric Psych: well oriented with normal range of affect and appropriate judgment/insight  Assessment and Plan  Well adult exam - Plan: CBC, Comprehensive metabolic panel with GFR, Lipid panel  Dysuria - Plan: Urinalysis, DISCONTINUED: diclofenac  Sodium (PENNSAID ) 2 % SOLN, CANCELED: Urinalysis  Weak urine stream - Plan: Ambulatory referral to Urology  Pruritic dermatitis - Plan: triamcinolone  cream (KENALOG ) 0.1 %  Chronic pain of left knee  Pain of left heel  Chronic bilateral low back pain without sciatica - Plan: methylPREDNISolone  acetate (DEPO-MEDROL ) injection 80 mg   Well 78 y.o. male. Counseled on diet and  exercise. Advanced directive form provided today.  Flu shot rec'd for mid Oct.  Chronic low back pain: Exacerbation of chronic issue.  Depo-Medrol  injection today.  Offered another round of physical therapy which he politely declines.  Continue home exercise program.  Heat, ice, Tylenol . For his knee, we will see how the injection does.  Could consider intra-articular steroid injection versus referral. Heel cups for shoes, wearing them more often.  If no improvement over the next several weeks he will let us  know and we will refer him to a specialist. He is requesting referral to a new urologist that is closer.  Refer to Dr. Roseann upstairs.  Check urine today. Other orders as above. Follow up in 6 mo.  The patient voiced understanding and agreement to the plan.  Mabel Mt Shipman, DO 02/27/24 5:20 PM

## 2024-02-28 LAB — URINALYSIS
Bilirubin Urine: NEGATIVE
Glucose, UA: NEGATIVE
Hgb urine dipstick: NEGATIVE
Ketones, ur: NEGATIVE
Nitrite: NEGATIVE
Specific Gravity, Urine: 1.014 (ref 1.001–1.035)
pH: 5.5 (ref 5.0–8.0)

## 2024-02-29 ENCOUNTER — Ambulatory Visit: Payer: Self-pay | Admitting: Family Medicine

## 2024-03-01 ENCOUNTER — Telehealth: Payer: Self-pay

## 2024-03-01 ENCOUNTER — Other Ambulatory Visit (HOSPITAL_BASED_OUTPATIENT_CLINIC_OR_DEPARTMENT_OTHER): Payer: Self-pay

## 2024-03-01 NOTE — Telephone Encounter (Signed)
 Language preference changed per patient request. He advised he did not need an interpreter.

## 2024-03-03 ENCOUNTER — Other Ambulatory Visit (HOSPITAL_BASED_OUTPATIENT_CLINIC_OR_DEPARTMENT_OTHER): Payer: Self-pay

## 2024-03-03 ENCOUNTER — Telehealth: Payer: Self-pay

## 2024-03-03 ENCOUNTER — Ambulatory Visit: Admitting: Urology

## 2024-03-03 ENCOUNTER — Other Ambulatory Visit: Payer: Self-pay | Admitting: Family Medicine

## 2024-03-03 ENCOUNTER — Encounter: Payer: Self-pay | Admitting: Urology

## 2024-03-03 ENCOUNTER — Other Ambulatory Visit: Payer: Self-pay

## 2024-03-03 VITALS — BP 172/79 | HR 65 | Ht 67.0 in | Wt 155.0 lb

## 2024-03-03 DIAGNOSIS — R399 Unspecified symptoms and signs involving the genitourinary system: Secondary | ICD-10-CM

## 2024-03-03 DIAGNOSIS — N419 Inflammatory disease of prostate, unspecified: Secondary | ICD-10-CM

## 2024-03-03 DIAGNOSIS — N401 Enlarged prostate with lower urinary tract symptoms: Secondary | ICD-10-CM | POA: Diagnosis not present

## 2024-03-03 DIAGNOSIS — N138 Other obstructive and reflux uropathy: Secondary | ICD-10-CM

## 2024-03-03 LAB — URINALYSIS, ROUTINE W REFLEX MICROSCOPIC
Bilirubin, UA: NEGATIVE
Glucose, UA: NEGATIVE
Ketones, UA: NEGATIVE
Nitrite, UA: NEGATIVE
RBC, UA: NEGATIVE
Specific Gravity, UA: 1.01 (ref 1.005–1.030)
Urobilinogen, Ur: 0.2 mg/dL (ref 0.2–1.0)
pH, UA: 6 (ref 5.0–7.5)

## 2024-03-03 LAB — BLADDER SCAN AMB NON-IMAGING

## 2024-03-03 LAB — MICROSCOPIC EXAMINATION

## 2024-03-03 MED ORDER — FOSFOMYCIN TROMETHAMINE 3 G PO PACK
3.0000 g | PACK | Freq: Once | ORAL | 0 refills | Status: AC
  Filled 2024-03-03: qty 3, 1d supply, fill #0

## 2024-03-03 MED ORDER — CIPROFLOXACIN HCL 500 MG PO TABS
500.0000 mg | ORAL_TABLET | Freq: Every day | ORAL | 0 refills | Status: AC
  Filled 2024-03-03: qty 20, 20d supply, fill #0

## 2024-03-03 NOTE — Progress Notes (Signed)
 Assessment: 1. Prostatitis, unspecified prostatitis type   2. Lower urinary tract symptoms (LUTS)   3. BPH with obstruction/lower urinary tract symptoms      Plan: I personally reviewed the patient's chart including provider notes. His exam is consistent with prostatitis.  Recommend treatment with antibiotics x 3 weeks. Continue tamsulosin . Return to office in approximately 6 weeks. Consider further evaluation if symptoms do not improve after treatment of prostatitis.  Chief Complaint:  Chief Complaint  Patient presents with   LUTS    History of Present Illness:  Joshua Thompson is a 78 y.o. male who is seen in consultation from Bonsall, Mabel Mt, DO for evaluation of lower urinary tract symptoms. He has a history of BPH with obstruction.  He previously underwent TURP's x 2 in [place] and more recently in 2016 at [facility] by Dr. Narvis.  He has a history of bilateral hydronephrosis as well.  Most recently, he has been followed by Dr. Devere at [facility].  He was last seen there in November 2022.  He has a history of lower urinary tract symptoms with intermittent stream, weak stream, urgency, nocturia 2-3 times, and sensation of incomplete emptying.  He has been managed with tamsulosin .  He reports some gradual worsening of his symptoms recently.  No gross hematuria.  He reports dysuria x 1 week. Bladder scan from 11/22 showed a residual of 35 mL.  He has chronic kidney disease with a creatinine of 3.78 from 02/27/2024.  He is followed by nephrology.  Past Medical History:  Past Medical History:  Diagnosis Date   Coronary artery disease    GERD (gastroesophageal reflux disease)    Hyperlipidemia    Hypertension     Past Surgical History:  Past Surgical History:  Procedure Laterality Date   CORONARY ANGIOPLASTY     3 STENTS   TRANSURETHRAL RESECTION OF PROSTATE     HAD DONE THREE TIMES    Allergies:  Allergies  Allergen Reactions   Amoxicillin Rash    Amoxicillin-Pot Clavulanate Rash   Ampicillin Rash    Family History:  Family History  Problem Relation Age of Onset   Hypertension Mother    Stroke Maternal Grandfather    Arrhythmia Neg Hx    Heart attack Neg Hx     Social History:  Social History   Tobacco Use   Smoking status: Never   Smokeless tobacco: Never  Vaping Use   Vaping status: Never Used  Substance Use Topics   Alcohol use: No   Drug use: No    Review of symptoms:  Constitutional:  Negative for unexplained weight loss, night sweats, fever, chills ENT:  Negative for nose bleeds, sinus pain, painful swallowing CV:  Negative for chest pain, shortness of breath, exercise intolerance, palpitations, loss of consciousness Resp:  Negative for cough, wheezing, shortness of breath GI:  Negative for nausea, vomiting, diarrhea, bloody stools GU:  Positives noted in HPI; otherwise negative for gross hematuria, dysuria, urinary incontinence Neuro:  Negative for seizures, poor balance, limb weakness, slurred speech Psych:  Negative for lack of energy, depression, anxiety Endocrine:  Negative for polydipsia, polyuria, symptoms of hypoglycemia (dizziness, hunger, sweating) Hematologic:  Negative for anemia, purpura, petechia, prolonged or excessive bleeding, use of anticoagulants  Allergic:  Negative for difficulty breathing or choking as a result of exposure to anything; no shellfish allergy; no allergic response (rash/itch) to materials, foods  Physical exam: BP (!) 172/79   Pulse 65   Ht 5' 7 (1.702 m)  Wt 155 lb (70.3 kg)   BMI 24.28 kg/m  GENERAL APPEARANCE:  Well appearing, well developed, well nourished, NAD HEENT: Atraumatic, Normocephalic, oropharynx clear. NECK: Supple without lymphadenopathy or thyromegaly. LUNGS: Clear to auscultation bilaterally. HEART: Regular Rate and Rhythm without murmurs, gallops, or rubs. ABDOMEN: Soft, non-tender, No Masses. EXTREMITIES: Moves all extremities well.  Without  clubbing, cyanosis, or edema. NEUROLOGIC:  Alert and oriented x 3, normal gait, CN II-XII grossly intact.  MENTAL STATUS:  Appropriate. BACK:  Non-tender to palpation.  No CVAT SKIN:  Warm, dry and intact.   GU: Penis:  uncircumcised Meatus: Normal Scrotum: normal, no masses Testis: normal without masses bilateral Prostate: 30 g, tender, no nodules Rectum: Normal tone,  no masses or tenderness   Results: U/A: 0-5 WBCs, 0-2 RBCs  PVR = 107 ml

## 2024-03-03 NOTE — Telephone Encounter (Signed)
 Copied from CRM #[id number]. Topic: General - Call Back - No Documentation >> Mar 02, 2024  5:34 PM Lauren C wrote: Reason for CRM: Pt returning recent call from the office. No notation about call seen on chart. Recent communication was from Martin, [place] to check on patient on mychart, so may be related to that. Please reach out to patient at [phone] if needing to speak with him.  I have been talking with pt on mychart and his message has been sent to Dr.Wendling.

## 2024-03-05 ENCOUNTER — Other Ambulatory Visit: Payer: Self-pay | Admitting: Family Medicine

## 2024-03-05 ENCOUNTER — Other Ambulatory Visit (HOSPITAL_BASED_OUTPATIENT_CLINIC_OR_DEPARTMENT_OTHER): Payer: Self-pay

## 2024-03-05 DIAGNOSIS — I10 Essential (primary) hypertension: Secondary | ICD-10-CM

## 2024-03-05 MED ORDER — HYDRALAZINE HCL 50 MG PO TABS
100.0000 mg | ORAL_TABLET | Freq: Three times a day (TID) | ORAL | 0 refills | Status: DC
  Filled 2024-03-21: qty 540, 90d supply, fill #0

## 2024-03-09 ENCOUNTER — Other Ambulatory Visit (HOSPITAL_BASED_OUTPATIENT_CLINIC_OR_DEPARTMENT_OTHER): Payer: Self-pay

## 2024-03-17 ENCOUNTER — Other Ambulatory Visit (HOSPITAL_BASED_OUTPATIENT_CLINIC_OR_DEPARTMENT_OTHER): Payer: Self-pay

## 2024-03-18 ENCOUNTER — Other Ambulatory Visit (HOSPITAL_BASED_OUTPATIENT_CLINIC_OR_DEPARTMENT_OTHER): Payer: Self-pay

## 2024-03-22 ENCOUNTER — Other Ambulatory Visit (HOSPITAL_BASED_OUTPATIENT_CLINIC_OR_DEPARTMENT_OTHER): Payer: Self-pay

## 2024-03-22 ENCOUNTER — Other Ambulatory Visit: Payer: Self-pay

## 2024-04-09 ENCOUNTER — Other Ambulatory Visit (HOSPITAL_BASED_OUTPATIENT_CLINIC_OR_DEPARTMENT_OTHER): Payer: Self-pay

## 2024-04-14 ENCOUNTER — Ambulatory Visit: Admitting: Urology

## 2024-04-14 ENCOUNTER — Encounter: Payer: Self-pay | Admitting: Urology

## 2024-04-14 VITALS — BP 148/70 | HR 65 | Ht 67.0 in | Wt 155.0 lb

## 2024-04-14 DIAGNOSIS — Z87438 Personal history of other diseases of male genital organs: Secondary | ICD-10-CM

## 2024-04-14 DIAGNOSIS — N401 Enlarged prostate with lower urinary tract symptoms: Secondary | ICD-10-CM

## 2024-04-14 DIAGNOSIS — N138 Other obstructive and reflux uropathy: Secondary | ICD-10-CM | POA: Diagnosis not present

## 2024-04-14 DIAGNOSIS — R8281 Pyuria: Secondary | ICD-10-CM | POA: Diagnosis not present

## 2024-04-14 LAB — BLADDER SCAN AMB NON-IMAGING

## 2024-04-14 NOTE — Progress Notes (Signed)
 Assessment: 1. BPH with obstruction/lower urinary tract symptoms   2. History of prostatitis   3. Pyuria     Plan: Continue tamsulosin . Urine culture sent today - will call with results. Will arrange follow-up pending results of above.  Chief Complaint:  Chief Complaint  Patient presents with   Prostatitis    History of Present Illness:  Joshua Thompson is a 78 y.o. male who is seen for further evaluation of lower urinary tract symptoms and prostatitis. He has a history of BPH with obstruction.  He previously underwent TURP's x 2 in [place] and more recently in 2016 at [facility] by Dr. Narvis.  He has a history of bilateral hydronephrosis as well.  Most recently, he has been followed by Dr. Devere at [facility].  He was last seen there in November 2022.  He has a history of lower urinary tract symptoms with intermittent stream, weak stream, urgency, nocturia 2-3 times, and sensation of incomplete emptying.  He has been managed with tamsulosin .  He reported some gradual worsening of his symptoms recently.  No gross hematuria.  He reported dysuria x 1 week. Bladder scan from 11/22 showed a residual of 35 mL. At his visit in 9/25, his exam was consistent with prostatitis.  He was treated with Cipro  500 mg daily x 3 weeks.  He has chronic kidney disease with a creatinine of 3.78 from 02/27/2024.  He is followed by nephrology.  He returns today for follow-up.  He has completed his antibiotics.  He has noted improvement in his symptoms with some improved stream.  He is no longer having dysuria.  No gross hematuria or flank pain.  He recently had several days of nocturnal enuresis.  Continues on tamsulosin . IPSS = 18/5.  Portions of the above documentation were copied from a prior visit for review purposes only.   Past Medical History:  Past Medical History:  Diagnosis Date   Coronary artery disease    GERD (gastroesophageal reflux disease)    Hyperlipidemia     Hypertension     Past Surgical History:  Past Surgical History:  Procedure Laterality Date   CORONARY ANGIOPLASTY     3 STENTS   TRANSURETHRAL RESECTION OF PROSTATE     HAD DONE THREE TIMES    Allergies:  Allergies  Allergen Reactions   Amoxicillin Rash   Amoxicillin-Pot Clavulanate Rash   Ampicillin Rash    Family History:  Family History  Problem Relation Age of Onset   Hypertension Mother    Stroke Maternal Grandfather    Arrhythmia Neg Hx    Heart attack Neg Hx     Social History:  Social History   Tobacco Use   Smoking status: Never   Smokeless tobacco: Never  Vaping Use   Vaping status: Never Used  Substance Use Topics   Alcohol use: No   Drug use: No    ROS: Constitutional:  Negative for fever, chills, weight loss CV: Negative for chest pain, previous MI, hypertension Respiratory:  Negative for shortness of breath, wheezing, sleep apnea, frequent cough GI:  Negative for nausea, vomiting, bloody stool, GERD  Physical exam: BP (!) 148/70   Pulse 65   Ht 5' 7 (1.702 m)   Wt 155 lb (70.3 kg)   BMI 24.28 kg/m  GENERAL APPEARANCE:  Well appearing, well developed, well nourished, NAD HEENT:  Atraumatic, normocephalic, oropharynx clear NECK:  Supple without lymphadenopathy or thyromegaly ABDOMEN:  Soft, non-tender, no masses EXTREMITIES:  Moves all extremities well,  without clubbing, cyanosis, or edema NEUROLOGIC:  Alert and oriented x 3, normal gait, CN II-XII grossly intact MENTAL STATUS:  appropriate BACK:  Non-tender to palpation, No CVAT SKIN:  Warm, dry, and intact   Results: U/A: >30 WBC, 0-2 RBC, few bacteria, nitrite negative  PVR = 0 ml

## 2024-04-15 LAB — URINALYSIS, ROUTINE W REFLEX MICROSCOPIC
Bilirubin, UA: NEGATIVE
Glucose, UA: NEGATIVE
Nitrite, UA: NEGATIVE
RBC, UA: NEGATIVE
Specific Gravity, UA: 1.015 (ref 1.005–1.030)
Urobilinogen, Ur: 0.2 mg/dL (ref 0.2–1.0)
pH, UA: 5.5 (ref 5.0–7.5)

## 2024-04-15 LAB — MICROSCOPIC EXAMINATION: WBC, UA: >30 /HPF — AB (ref 0–5)

## 2024-04-17 LAB — URINE CULTURE

## 2024-04-19 ENCOUNTER — Ambulatory Visit: Payer: Self-pay | Admitting: Urology

## 2024-04-19 ENCOUNTER — Other Ambulatory Visit (HOSPITAL_BASED_OUTPATIENT_CLINIC_OR_DEPARTMENT_OTHER): Payer: Self-pay

## 2024-04-19 MED ORDER — CIPROFLOXACIN HCL 500 MG PO TABS
500.0000 mg | ORAL_TABLET | Freq: Every day | ORAL | 0 refills | Status: AC
  Filled 2024-04-19: qty 7, 7d supply, fill #0

## 2024-04-21 ENCOUNTER — Other Ambulatory Visit: Payer: Self-pay | Admitting: Family Medicine

## 2024-04-21 ENCOUNTER — Other Ambulatory Visit (HOSPITAL_BASED_OUTPATIENT_CLINIC_OR_DEPARTMENT_OTHER): Payer: Self-pay

## 2024-04-21 DIAGNOSIS — J302 Other seasonal allergic rhinitis: Secondary | ICD-10-CM

## 2024-04-21 MED ORDER — LEVOCETIRIZINE DIHYDROCHLORIDE 5 MG PO TABS
5.0000 mg | ORAL_TABLET | Freq: Every evening | ORAL | 3 refills | Status: AC
  Filled 2024-05-05: qty 30, 30d supply, fill #0
  Filled 2024-05-19: qty 30, 30d supply, fill #1

## 2024-04-30 ENCOUNTER — Other Ambulatory Visit (HOSPITAL_BASED_OUTPATIENT_CLINIC_OR_DEPARTMENT_OTHER): Payer: Self-pay

## 2024-05-04 ENCOUNTER — Ambulatory Visit: Admitting: Family Medicine

## 2024-05-05 ENCOUNTER — Encounter: Payer: Self-pay | Admitting: Family Medicine

## 2024-05-05 ENCOUNTER — Ambulatory Visit: Admitting: Family Medicine

## 2024-05-05 ENCOUNTER — Other Ambulatory Visit: Payer: Self-pay

## 2024-05-05 ENCOUNTER — Other Ambulatory Visit (HOSPITAL_BASED_OUTPATIENT_CLINIC_OR_DEPARTMENT_OTHER): Payer: Self-pay

## 2024-05-05 VITALS — BP 134/70 | HR 63 | Temp 98.0°F | Resp 16 | Ht 67.0 in | Wt 156.6 lb

## 2024-05-05 DIAGNOSIS — I10 Essential (primary) hypertension: Secondary | ICD-10-CM

## 2024-05-05 DIAGNOSIS — R339 Retention of urine, unspecified: Secondary | ICD-10-CM | POA: Diagnosis not present

## 2024-05-05 DIAGNOSIS — M25562 Pain in left knee: Secondary | ICD-10-CM | POA: Diagnosis not present

## 2024-05-05 DIAGNOSIS — M1A9XX0 Chronic gout, unspecified, without tophus (tophi): Secondary | ICD-10-CM

## 2024-05-05 DIAGNOSIS — G8929 Other chronic pain: Secondary | ICD-10-CM

## 2024-05-05 NOTE — Progress Notes (Signed)
 Chief Complaint  Patient presents with   Follow-up    Follow Up    Subjective Joshua Thompson is a 78 y.o. male who presents for hypertension follow up. He does monitor home blood pressures. Blood pressures ranging from 140's/70's on average. He is compliant with medications- Norvasc  5 mg/d, Coreg  25 mg bid, hydralazine  100 mg TID, Cardura  2 mg qhs. Patient has these side effects of medication: none He is sometimes adhering to a healthy diet overall. Current exercise: walking No CP or SOB.    Gout Patient has a history of gout.  He takes allopurinol  200 mg daily.  Reports compliance, no adverse effects.  He does not remember when his last flare was.  Diet is healthy overall.  Patient has had knee pain on the left over the last 7 to 8 months.  No injury or change in activity.  Is causing him to limp.  No bruising, redness, or swelling.  He is requesting an x-ray.  Past Medical History:  Diagnosis Date   Coronary artery disease    GERD (gastroesophageal reflux disease)    Hyperlipidemia    Hypertension     Exam BP 134/70 (BP Location: Left Arm, Patient Position: Sitting)   Pulse 63   Temp 98 F (36.7 C) (Oral)   Resp 16   Ht 5' 7 (1.702 m)   Wt 156 lb 9.6 oz (71 kg)   SpO2 96%   BMI 24.53 kg/m  General:  well developed, well nourished, in no apparent distress Heart: RRR, no bruits, no LE edema Lungs: clear to auscultation, no accessory muscle use Psych: well oriented with normal range of affect and appropriate judgment/insight  Essential hypertension, benign - Plan: CBC, Comprehensive metabolic panel with GFR, Lipid panel  Chronic gout involving toe without tophus, unspecified cause, unspecified laterality - Plan: Uric acid  Chronic pain of left knee - Plan: DG Knee Complete 4 Views Left  Urinary retention - Plan: Urinalysis, Urine Culture  Chronic, stable. Cont hydralazine  1000 mg TID, cardura  2 mg/d, Coreg  25 mg bid, Norvasc  5 mg/d. Counseled on diet and  exercise. Chronic, stable.  Continue allopurinol  200 mg daily.  Check labs. Check x-ray at the med center in [place]. Follow-up on urine studies.  It sounds like his lingering symptoms are related to BPH rather than an acute infection. F/u in 6 mo. The patient voiced understanding and agreement to the plan.  Mabel Mt [place], DO 05/05/24  2:46 PM

## 2024-05-05 NOTE — Patient Instructions (Signed)
 Give [place] 2-3 business days to get the results of your labs back.   Keep the diet clean and stay active.  Please get your X-ray done at the [facility] in [place]: [address] [place], [place] [address] [phone]  You do not need an appointment for this location.   Let us know if you need anything.

## 2024-05-06 ENCOUNTER — Ambulatory Visit: Payer: Self-pay | Admitting: Family Medicine

## 2024-05-06 ENCOUNTER — Other Ambulatory Visit (HOSPITAL_BASED_OUTPATIENT_CLINIC_OR_DEPARTMENT_OTHER): Payer: Self-pay

## 2024-05-06 LAB — LIPID PANEL
Cholesterol: 148 mg/dL (ref <200)
HDL: 60 mg/dL (ref >=40)
LDL Cholesterol (Calc): 68 mg/dL
Non-HDL Cholesterol (Calc): 88 mg/dL (ref <130)
Total CHOL/HDL Ratio: 2.5 (calc) (ref <5.0)
Triglycerides: 112 mg/dL (ref <150)

## 2024-05-06 LAB — COMPREHENSIVE METABOLIC PANEL WITH GFR
AG Ratio: 1.6 (calc) (ref 1.0–2.5)
ALT: 10 U/L (ref 9–46)
AST: 16 U/L (ref 10–35)
Albumin: 4.2 g/dL (ref 3.6–5.1)
Alkaline phosphatase (APISO): 58 U/L (ref 35–144)
BUN/Creatinine Ratio: 15 (calc) (ref 6–22)
BUN: 59 mg/dL — ABNORMAL HIGH (ref 7–25)
CO2: 22 mmol/L (ref 20–32)
Calcium: 9.3 mg/dL (ref 8.6–10.3)
Chloride: 108 mmol/L (ref 98–110)
Creat: 3.92 mg/dL — ABNORMAL HIGH (ref 0.70–1.28)
Globulin: 2.7 g/dL (ref 1.9–3.7)
Glucose, Bld: 105 mg/dL — ABNORMAL HIGH (ref 65–99)
Potassium: 5 mmol/L (ref 3.5–5.3)
Sodium: 141 mmol/L (ref 135–146)
Total Bilirubin: 0.5 mg/dL (ref 0.2–1.2)
Total Protein: 6.9 g/dL (ref 6.1–8.1)
eGFR: 15 mL/min/1.73m2 — ABNORMAL LOW (ref >=60)

## 2024-05-06 LAB — CBC
HCT: 34.7 % — ABNORMAL LOW (ref 38.5–50.0)
Hemoglobin: 11.1 g/dL — ABNORMAL LOW (ref 13.2–17.1)
MCH: 30.7 pg (ref 27.0–33.0)
MCHC: 32 g/dL (ref 32.0–36.0)
MCV: 96.1 fL (ref 80.0–100.0)
MPV: 9.9 fL (ref 7.5–12.5)
Platelets: 212 Thousand/uL (ref 140–400)
RBC: 3.61 Million/uL — ABNORMAL LOW (ref 4.20–5.80)
RDW: 13.1 % (ref 11.0–15.0)
WBC: 6.5 Thousand/uL (ref 3.8–10.8)

## 2024-05-06 LAB — URINALYSIS
Bilirubin Urine: NEGATIVE
Glucose, UA: NEGATIVE
Hgb urine dipstick: NEGATIVE
Ketones, ur: NEGATIVE
Nitrite: NEGATIVE
Specific Gravity, Urine: 1.016 (ref 1.001–1.035)
pH: 5.5 (ref 5.0–8.0)

## 2024-05-06 LAB — URIC ACID: Uric Acid, Serum: 7.1 mg/dL (ref 4.0–8.0)

## 2024-05-06 MED ORDER — KETOCONAZOLE 2 % EX CREA
1.0000 | TOPICAL_CREAM | Freq: Every day | CUTANEOUS | 0 refills | Status: DC
  Filled 2024-05-06: qty 15, 30d supply, fill #0

## 2024-05-06 NOTE — Telephone Encounter (Signed)
 Frann Mabel Mt, DO to [place], [place], CMA (Selected Message)     05/05/24  5:09 PM OK to send, ketoconazole  2%, 1 application daily. Thx.

## 2024-05-08 LAB — URINE CULTURE
MICRO NUMBER:: 17256763
SPECIMEN QUALITY:: ADEQUATE

## 2024-05-09 MED ORDER — TETRACYCLINE HCL 500 MG PO CAPS
500.0000 mg | ORAL_CAPSULE | Freq: Two times a day (BID) | ORAL | 0 refills | Status: AC
  Filled 2024-05-09: qty 14, 7d supply, fill #0

## 2024-05-10 ENCOUNTER — Other Ambulatory Visit (HOSPITAL_BASED_OUTPATIENT_CLINIC_OR_DEPARTMENT_OTHER): Payer: Self-pay

## 2024-05-11 ENCOUNTER — Encounter: Payer: Self-pay | Admitting: Internal Medicine

## 2024-05-17 ENCOUNTER — Other Ambulatory Visit (HOSPITAL_BASED_OUTPATIENT_CLINIC_OR_DEPARTMENT_OTHER): Payer: Self-pay

## 2024-05-17 ENCOUNTER — Ambulatory Visit

## 2024-05-17 DIAGNOSIS — G8929 Other chronic pain: Secondary | ICD-10-CM | POA: Diagnosis not present

## 2024-05-17 DIAGNOSIS — M25562 Pain in left knee: Secondary | ICD-10-CM | POA: Diagnosis not present

## 2024-05-19 ENCOUNTER — Other Ambulatory Visit: Payer: Self-pay | Admitting: Family Medicine

## 2024-05-19 ENCOUNTER — Other Ambulatory Visit (HOSPITAL_BASED_OUTPATIENT_CLINIC_OR_DEPARTMENT_OTHER): Payer: Self-pay

## 2024-05-19 DIAGNOSIS — L308 Other specified dermatitis: Secondary | ICD-10-CM

## 2024-05-19 MED ORDER — CLOTRIMAZOLE-BETAMETHASONE 1-0.05 % EX CREA
1.0000 | TOPICAL_CREAM | Freq: Every day | CUTANEOUS | 0 refills | Status: DC
  Filled 2024-05-19: qty 30, 30d supply, fill #0
  Filled 2024-05-19: qty 15, 30d supply, fill #0
  Filled 2024-05-19: qty 30, 30d supply, fill #0

## 2024-05-20 ENCOUNTER — Ambulatory Visit: Admitting: Urology

## 2024-05-20 ENCOUNTER — Other Ambulatory Visit (HOSPITAL_BASED_OUTPATIENT_CLINIC_OR_DEPARTMENT_OTHER): Payer: Self-pay

## 2024-05-20 MED ORDER — ALLOPURINOL 100 MG PO TABS
200.0000 mg | ORAL_TABLET | Freq: Every day | ORAL | 3 refills | Status: AC
  Filled 2024-05-20 (×2): qty 180, 90d supply, fill #0

## 2024-05-20 NOTE — Progress Notes (Deleted)
 Assessment: 1. Frequent UTI   2. BPH with obstruction/lower urinary tract symptoms   3. History of prostatitis     Plan: Continue tamsulosin . Urine culture sent today - will call with results. Will arrange follow-up pending results of above.  Chief Complaint:  No chief complaint on file.   History of Present Illness:  Joshua Thompson is a 78 y.o. male who is seen for further evaluation of lower urinary tract symptoms and prostatitis. He has a history of BPH with obstruction.  He previously underwent TURP's x 2 in [place] and more recently in 2016 at [facility] by Dr. Narvis.  He has a history of bilateral hydronephrosis as well.  Most recently, he has been followed by Dr. Devere at [facility].  He was last seen there in November 2022.  He has a history of lower urinary tract symptoms with intermittent stream, weak stream, urgency, nocturia 2-3 times, and sensation of incomplete emptying.  He has been managed with tamsulosin .  He reported some gradual worsening of his symptoms recently.  No gross hematuria.  He reported dysuria x 1 week. Bladder scan from 11/22 showed a residual of 35 mL. At his visit in 9/25, his exam was consistent with prostatitis.  He was treated with Cipro  500 mg daily x 3 weeks.  He has chronic kidney disease with a creatinine of 3.78 from 02/27/2024.  He is followed by nephrology.  At his visit in October 2025, he had completed his antibiotics.  He noted improvement in his symptoms with some improved stream.  He was no longer having dysuria.  No gross hematuria or flank pain.  He recently had several days of nocturnal enuresis.  Continued on tamsulosin . IPSS = 18/5. Urine culture from 04/14/2024: >100 K Staph epidermidis.  Treated with Cipro  x 7 days. Urine culture from 05/05/2024: >100 K Staph epidermidis.  Portions of the above documentation were copied from a prior visit for review purposes only.   Past Medical History:  Past Medical History:   Diagnosis Date   Coronary artery disease    GERD (gastroesophageal reflux disease)    Hyperlipidemia    Hypertension     Past Surgical History:  Past Surgical History:  Procedure Laterality Date   CORONARY ANGIOPLASTY     3 STENTS   TRANSURETHRAL RESECTION OF PROSTATE     HAD DONE THREE TIMES    Allergies:  Allergies  Allergen Reactions   Amoxicillin Rash   Amoxicillin-Pot Clavulanate Rash   Ampicillin Rash    Family History:  Family History  Problem Relation Age of Onset   Hypertension Mother    Stroke Maternal Grandfather    Arrhythmia Neg Hx    Heart attack Neg Hx     Social History:  Social History   Tobacco Use   Smoking status: Never   Smokeless tobacco: Never  Vaping Use   Vaping status: Never Used  Substance Use Topics   Alcohol use: No   Drug use: No    ROS: Constitutional:  Negative for fever, chills, weight loss CV: Negative for chest pain, previous MI, hypertension Respiratory:  Negative for shortness of breath, wheezing, sleep apnea, frequent cough GI:  Negative for nausea, vomiting, bloody stool, GERD  Physical exam: There were no vitals taken for this visit. GENERAL APPEARANCE:  Well appearing, well developed, well nourished, NAD HEENT:  Atraumatic, normocephalic, oropharynx clear NECK:  Supple without lymphadenopathy or thyromegaly ABDOMEN:  Soft, non-tender, no masses EXTREMITIES:  Moves all extremities well, without  clubbing, cyanosis, or edema NEUROLOGIC:  Alert and oriented x 3, normal gait, CN II-XII grossly intact MENTAL STATUS:  appropriate BACK:  Non-tender to palpation, No CVAT SKIN:  Warm, dry, and intact   Results: U/A:

## 2024-05-24 ENCOUNTER — Ambulatory Visit: Payer: Self-pay

## 2024-05-24 NOTE — Telephone Encounter (Signed)
 FYI Only or Action Required?: FYI only for provider: appointment scheduled on 05/28/2024.  Patient was last seen in primary care on 05/05/2024 by Joshua Mabel Mt, DO.  Called Nurse Triage reporting Knee Pain.  Symptoms began several months ago.  Interventions attempted: Prescription medications: ointment.  Triage Disposition: See PCP When Office is Open (Within 3 Days)  Patient/caregiver understands and will follow disposition?: Yes          Copied from CRM #[id number]. Topic: Clinical - Red Word Triage >> May 24, 2024  4:49 PM Joshua Thompson wrote: Red Word that prompted transfer to Nurse Triage: knee pain. Provider advised pt to schedule. >> May 24, 2024  5:01 PM Joshua Thompson wrote: Pt hung up while holding, I called back and he would like for a nurse to call him.  Reason for Disposition  [1] MODERATE pain (e.g., interferes with normal activities, limping) AND [2] present > 3 days  Answer Assessment - Initial Assessment Questions Left knee pain Onset: A few months Pt states he had an x-ray and showed fluid in the knee that needs to be extracted Intermittent pain with movement, 2-3/10 right now, walking 5-7/10 pain level  Protocols used: Knee Pain-A-AH

## 2024-05-25 NOTE — Telephone Encounter (Signed)
 Pt schedule for knee injection per Dr.Wendling.

## 2024-05-26 ENCOUNTER — Ambulatory Visit: Admitting: Urology

## 2024-05-26 ENCOUNTER — Telehealth: Payer: Self-pay | Admitting: Urology

## 2024-05-26 NOTE — Progress Notes (Deleted)
 Assessment: 1. BPH with obstruction/lower urinary tract symptoms   2. History of prostatitis   3. History of UTI      Plan: Continue tamsulosin . Urine culture sent today - will call with results. Recommend further evaluation with CT renal stone study and cystoscopy  Chief Complaint:  No chief complaint on file.   History of Present Illness:  Joshua Thompson is a 78 y.o. male who is seen for further evaluation of lower urinary tract symptoms and prostatitis. He has a history of BPH with obstruction.  He previously underwent TURP's x 2 in [place] and more recently in 2016 at [facility] by Dr. Narvis.  He has a history of bilateral hydronephrosis as well.  Most recently, he has been followed by Dr. Devere at [facility].  He was last seen there in November 2022.  He has a history of lower urinary tract symptoms with intermittent stream, weak stream, urgency, nocturia 2-3 times, and sensation of incomplete emptying.  He has been managed with tamsulosin .  He reported some gradual worsening of his symptoms recently.  No gross hematuria.  He reported dysuria x 1 week. Bladder scan from 11/22 showed a residual of 35 mL. At his visit in 9/25, his exam was consistent with prostatitis.  He was treated with Cipro  500 mg daily x 3 weeks.  He has chronic kidney disease with a creatinine of 3.78 from 02/27/2024.  He is followed by nephrology.  At his visit in October 2025, he had completed his antibiotics.  He noted improvement in his symptoms with some improved stream.  He was no longer having dysuria.  No gross hematuria or flank pain.  He recently had several days of nocturnal enuresis.  Continued on tamsulosin . IPSS = 18/5. PVR = 0 ml.  Urine culture from 04/14/2024: >100 K Staph epidermidis.  Treated with Cipro  x 7 days. Urine culture from 05/05/2024: >100 K Staph epidermidis.  Portions of the above documentation were copied from a prior visit for review purposes only.   Past  Medical History:  Past Medical History:  Diagnosis Date   Coronary artery disease    GERD (gastroesophageal reflux disease)    Hyperlipidemia    Hypertension     Past Surgical History:  Past Surgical History:  Procedure Laterality Date   CORONARY ANGIOPLASTY     3 STENTS   TRANSURETHRAL RESECTION OF PROSTATE     HAD DONE THREE TIMES    Allergies:  Allergies  Allergen Reactions   Amoxicillin Rash   Amoxicillin-Pot Clavulanate Rash   Ampicillin Rash    Family History:  Family History  Problem Relation Age of Onset   Hypertension Mother    Stroke Maternal Grandfather    Arrhythmia Neg Hx    Heart attack Neg Hx     Social History:  Social History   Tobacco Use   Smoking status: Never   Smokeless tobacco: Never  Vaping Use   Vaping status: Never Used  Substance Use Topics   Alcohol use: No   Drug use: No    ROS: Constitutional:  Negative for fever, chills, weight loss CV: Negative for chest pain, previous MI, hypertension Respiratory:  Negative for shortness of breath, wheezing, sleep apnea, frequent cough GI:  Negative for nausea, vomiting, bloody stool, GERD  Physical exam: There were no vitals taken for this visit. GENERAL APPEARANCE:  Well appearing, well developed, well nourished, NAD HEENT:  Atraumatic, normocephalic, oropharynx clear NECK:  Supple without lymphadenopathy or thyromegaly ABDOMEN:  Soft,  non-tender, no masses EXTREMITIES:  Moves all extremities well, without clubbing, cyanosis, or edema NEUROLOGIC:  Alert and oriented x 3, normal gait, CN II-XII grossly intact MENTAL STATUS:  appropriate BACK:  Non-tender to palpation, No CVAT SKIN:  Warm, dry, and intact   Results: U/A:

## 2024-05-26 NOTE — Telephone Encounter (Signed)
 Patient no showed 12/10 appt- called to reschedule could not leave VM

## 2024-05-28 ENCOUNTER — Encounter: Payer: Self-pay | Admitting: Family Medicine

## 2024-05-28 ENCOUNTER — Ambulatory Visit: Admitting: Family Medicine

## 2024-05-28 VITALS — BP 134/82 | HR 75 | Temp 98.0°F | Resp 16 | Ht 67.0 in | Wt 161.0 lb

## 2024-05-28 DIAGNOSIS — G8929 Other chronic pain: Secondary | ICD-10-CM

## 2024-05-28 MED ORDER — METHYLPREDNISOLONE ACETATE 40 MG/ML IJ SUSP
40.0000 mg | Freq: Once | INTRAMUSCULAR | Status: AC
  Administered 2024-05-28: 40 mg via INTRAMUSCULAR

## 2024-05-28 NOTE — Progress Notes (Signed)
 Chief Complaint  Patient presents with   Knee Pain    Knee Pain    Subjective: Patient is a 78 y.o. male here for knee pain.  Chronic left knee pain.  X-ray showed effusion with arthritis.  He is here for aspiration and injection today.  No recent injury or change activity.  He has been compliant with his home exercise program.  He has been using Tylenol  for pain.  No bruising, redness, swelling, catching or locking.  Past Medical History:  Diagnosis Date   Coronary artery disease    GERD (gastroesophageal reflux disease)    Hyperlipidemia    Hypertension     Objective: BP 134/82 (BP Location: Left Arm, Patient Position: Sitting)   Pulse 75   Temp 98 F (36.7 C) (Oral)   Resp 16   Ht 5' 7 (1.702 m)   Wt 161 lb (73 kg)   SpO2 98%   BMI 25.22 kg/m  General: Awake, appears stated age MSK: No TTP.  No obvious effusion or deformity. Lungs: No accessory muscle use Psych: Age appropriate judgment and insight, normal affect and mood  Procedure Note; Knee injection Verbal consent obtained. The area of the antero-lateral joint line was palpated and cleaned with alcohol x1. A 23-gauge needle was used to enter the joint space anterolaterally with ease. Approximately 2 cc of serous fluid was aspirated. A new syringe containing 40 mg of Depomedrol with 2 mL of 1% lidocaine  was then injected. A Band-Aid was placed. The patient tolerated the procedure well. There were no complications noted.  Assessment and Plan: Chronic pain of right knee - Plan: PR ARTHROCENTESIS ASPIR&/INJ MAJOR JT/BURSA W/O US   Chronic issue. Aspiration and injection today. Ice, heat, Tylenol , stretches/exercises. F/u as originally scheduled.  The patient voiced understanding and agreement to the plan.  Mabel Mt [place], DO 05/28/2024  3:06 PM

## 2024-05-28 NOTE — Addendum Note (Signed)
 Addended by: Bradshaw Minihan M on: 05/28/2024 03:19 PM   Modules accepted: Orders

## 2024-05-28 NOTE — Patient Instructions (Signed)
 Ice/cold pack over area for 10-15 min twice daily. ? ?Heat (pad or rice pillow in microwave) over affected area, 10-15 minutes twice daily.  ? ?OK to take Tylenol 1000 mg (2 extra strength tabs) or 975 mg (3 regular strength tabs) every 6 hours as needed. ? ?Let us know if you need anything. ? ?Knee Exercises ?It is normal to feel mild stretching, pulling, tightness, or discomfort as you do these exercises, but you should stop right away if you feel sudden pain or your pain gets worse.  ?STRETCHING AND RANGE OF MOTION EXERCISES  ?These exercises warm up your muscles and joints and improve the movement and flexibility of your knee. These exercises also help to relieve pain, numbness, and tingling. ?Exercise A: Knee Extension, Prone  ?Lie on your abdomen on a bed. ?Place your left / right knee just beyond the edge of the surface so your knee is not on the bed. You can put a towel under your left / right thigh just above your knee for comfort. ?Relax your leg muscles and allow gravity to straighten your knee. You should feel a stretch behind your left / right knee. ?Hold this position for 30 seconds. ?Scoot up so your knee is supported between repetitions. ?Repeat 2 times. Complete this stretch 3 times per week. ?Exercise B: Knee Flexion, Active  ? ?  ?Lie on your back with both knees straight. If this causes back discomfort, bend your left / right knee so your foot is flat on the floor. ?Slowly slide your left / right heel back toward your buttocks until you feel a gentle stretch in the front of your knee or thigh. ?Hold this position for 30 seconds. ?Slowly slide your left / right heel back to the starting position. ?Repeat 2 times. Complete this exercise 3 times per week. ?Exercise C: Quadriceps, Prone  ? ?  ?Lie on your abdomen on a firm surface, such as a bed or padded floor. ?Bend your left / right knee and hold your ankle. If you cannot reach your ankle or pant leg, loop a belt around your foot and grab the belt  instead. ?Gently pull your heel toward your buttocks. Your knee should not slide out to the side. You should feel a stretch in the front of your thigh and knee. ?Hold this position for 30 seconds. ?Repeat 2 times. Complete this stretch 3 times per week. ?Exercise D: Hamstring, Supine  ?Lie on your back. ?Loop a belt or towel over the ball of your left / right foot. The ball of your foot is on the walking surface, right under your toes. ?Straighten your left / right knee and slowly pull on the belt to raise your leg until you feel a gentle stretch behind your knee. ?Do not let your left / right knee bend while you do this. ?Keep your other leg flat on the floor. ?Hold this position for 30 seconds. ?Repeat 2 times. Complete this stretch 3 times per week. ?STRENGTHENING EXERCISES  ?These exercises build strength and endurance in your knee. Endurance is the ability to use your muscles for a long time, even after they get tired. ?Exercise E: Quadriceps, Isometric  ? ?  ?Lie on your back with your left / right leg extended and your other knee bent. Put a rolled towel or small pillow under your knee if told by your health care provider. ?Slowly tense the muscles in the front of your left / right thigh. You should see your kneecap slide  up toward your hip or see increased dimpling just above the knee. This motion will push the back of the knee toward the floor. ?For 3 seconds, keep the muscle as tight as you can without increasing your pain. ?Relax the muscles slowly and completely. Repeat for 10 total reps ?Repeat 2 ti mes. Complete this exercise 3 times per week. ?Exercise F: Straight Leg Raises - Quadriceps  ?Lie on your back with your left / right leg extended and your other knee bent. ?Tense the muscles in the front of your left / right thigh. You should see your kneecap slide up or see increased dimpling just above the knee. Your thigh may even shake a bit. ?Keep these muscles tight as you raise your leg 4-6 inches  (10-15 cm) off the floor. Do not let your knee bend. ?Hold this position for 3 seconds. ?Keep these muscles tense as you lower your leg. ?Relax your muscles slowly and completely after each repetition. 10 total reps. ?Repeat 2 times. Complete this exercise 3 times per week. ? ?Exercise G: Hamstring Curls  ? ?  ?If told by your health care provider, do this exercise while wearing ankle weights. Begin with 5 lb weights (optional). Then increase the weight by 1 lb (0.5 kg) increments. Do not wear ankle weights that are more than 20 lbs to start with. ?Lie on your abdomen with your legs straight. ?Bend your left / right knee as far as you can without feeling pain. Keep your hips flat against the floor. ?Hold this position for 3 seconds. ?Slowly lower your leg to the starting position. Repeat for 10 reps.  ?Repeat 2 times. Complete this exercise 3 times per week. ?Exercise H: Squats (Quadriceps)  ?Stand in front of a table, with your feet and knees pointing straight ahead. You may rest your hands on the table for balance but not for support. ?Slowly bend your knees and lower your hips like you are going to sit in a chair. ?Keep your weight over your heels, not over your toes. ?Keep your lower legs upright so they are parallel with the table legs. ?Do not let your hips go lower than your knees. ?Do not bend lower than told by your health care provider. ?If your knee pain increases, do not bend as low. ?Hold the squat position for 1 second. ?Slowly push with your legs to return to standing. Do not use your hands to pull yourself to standing. ?Repeat 2 times. Complete this exercise 3 times per week. ?Exercise I: Wall Slides (Quadriceps)  ? ?  ?Lean your back against a smooth wall or door while you walk your feet out 18-24 inches (46-61 cm) from it. ?Place your feet hip-width apart. ?Slowly slide down the wall or door until your knees Repeat 2 times. Complete this exercise every other day. ?Exercise K: Straight Leg Raises -  Hip Abductors  ?Lie on your side with your left / right leg in the top position. Lie so your head, shoulder, knee, and hip line up. You may bend your bottom knee to help you keep your balance. ?Roll your hips slightly forward so your hips are stacked directly over each other and your left / right knee is facing forward. ?Leading with your heel, lift your top leg 4-6 inches (10-15 cm). You should feel the muscles in your outer hip lifting. ?Do not let your foot drift forward. ?Do not let your knee roll toward the ceiling. ?Hold this position for 3 seconds. ?Slowly return your  leg to the starting position. ?Let your muscles relax completely after each repetition. 10 total reps. ?Repeat 2 times. Complete this exercise 3 times per week. ?Exercise J: Straight Leg Raises - Hip Extensors  ?Lie on your abdomen on a firm surface. You can put a pillow under your hips if that is more comfortable. ?Tense the muscles in your buttocks and lift your left / right leg about 4-6 inches (10-15 cm). Keep your knee straight as you lift your leg. ?Hold this position for 3 seconds. ?Slowly lower your leg to the starting position. ?Let your leg relax completely after each repetition. ?Repeat 2 times. Complete this exercise 3 times per week. ?Document Released: 04/17/2005 Document Revised: 02/26/2016 Document Reviewed: 04/09/2015 ?[employer] Patient Education ? 2017 [employer] ? ?

## 2024-06-01 ENCOUNTER — Encounter: Payer: Self-pay | Admitting: Family Medicine

## 2024-06-14 ENCOUNTER — Other Ambulatory Visit (HOSPITAL_BASED_OUTPATIENT_CLINIC_OR_DEPARTMENT_OTHER): Payer: Self-pay

## 2024-06-14 ENCOUNTER — Other Ambulatory Visit: Payer: Self-pay | Admitting: Family Medicine

## 2024-06-14 DIAGNOSIS — I10 Essential (primary) hypertension: Secondary | ICD-10-CM

## 2024-06-14 MED ORDER — HYDRALAZINE HCL 50 MG PO TABS
100.0000 mg | ORAL_TABLET | Freq: Three times a day (TID) | ORAL | 0 refills | Status: AC
  Filled 2024-06-15: qty 540, 90d supply, fill #0
  Filled 2024-06-16: qty 180, 30d supply, fill #0

## 2024-06-15 ENCOUNTER — Other Ambulatory Visit: Payer: Self-pay

## 2024-06-15 ENCOUNTER — Other Ambulatory Visit: Payer: Self-pay | Admitting: Family Medicine

## 2024-06-15 ENCOUNTER — Other Ambulatory Visit (HOSPITAL_BASED_OUTPATIENT_CLINIC_OR_DEPARTMENT_OTHER): Payer: Self-pay

## 2024-06-15 DIAGNOSIS — L308 Other specified dermatitis: Secondary | ICD-10-CM

## 2024-06-15 MED ORDER — CLOTRIMAZOLE-BETAMETHASONE 1-0.05 % EX CREA
1.0000 | TOPICAL_CREAM | Freq: Every day | CUTANEOUS | 0 refills | Status: AC
  Filled 2024-06-15: qty 30, 30d supply, fill #0
  Filled 2024-06-17: qty 15, 30d supply, fill #0

## 2024-06-16 ENCOUNTER — Other Ambulatory Visit (HOSPITAL_BASED_OUTPATIENT_CLINIC_OR_DEPARTMENT_OTHER): Payer: Self-pay

## 2024-06-16 ENCOUNTER — Other Ambulatory Visit: Payer: Self-pay | Admitting: Family Medicine

## 2024-06-18 ENCOUNTER — Encounter (HOSPITAL_BASED_OUTPATIENT_CLINIC_OR_DEPARTMENT_OTHER): Payer: Self-pay

## 2024-06-18 ENCOUNTER — Other Ambulatory Visit: Payer: Self-pay

## 2024-06-18 ENCOUNTER — Other Ambulatory Visit (HOSPITAL_BASED_OUTPATIENT_CLINIC_OR_DEPARTMENT_OTHER): Payer: Self-pay

## 2024-06-18 MED ORDER — DOXAZOSIN MESYLATE 2 MG PO TABS
2.0000 mg | ORAL_TABLET | Freq: Every day | ORAL | 1 refills | Status: AC
  Filled 2024-06-18 – 2024-07-02 (×2): qty 90, 90d supply, fill #0

## 2024-06-30 ENCOUNTER — Other Ambulatory Visit (HOSPITAL_BASED_OUTPATIENT_CLINIC_OR_DEPARTMENT_OTHER): Payer: Self-pay

## 2024-07-02 ENCOUNTER — Other Ambulatory Visit (HOSPITAL_BASED_OUTPATIENT_CLINIC_OR_DEPARTMENT_OTHER): Payer: Self-pay

## 2024-07-08 ENCOUNTER — Other Ambulatory Visit (HOSPITAL_BASED_OUTPATIENT_CLINIC_OR_DEPARTMENT_OTHER): Payer: Self-pay

## 2024-07-08 ENCOUNTER — Other Ambulatory Visit: Payer: Self-pay | Admitting: Family Medicine

## 2024-07-08 MED ORDER — PANTOPRAZOLE SODIUM 40 MG PO TBEC: 40.0000 mg | DELAYED_RELEASE_TABLET | Freq: Every day | ORAL | 3 refills | Status: AC

## 2024-07-16 ENCOUNTER — Other Ambulatory Visit (HOSPITAL_BASED_OUTPATIENT_CLINIC_OR_DEPARTMENT_OTHER): Payer: Self-pay

## 2024-07-16 ENCOUNTER — Other Ambulatory Visit: Payer: Self-pay | Admitting: Family Medicine

## 2024-07-16 ENCOUNTER — Ambulatory Visit: Admitting: Urology

## 2024-07-16 DIAGNOSIS — I2510 Atherosclerotic heart disease of native coronary artery without angina pectoris: Secondary | ICD-10-CM

## 2024-07-16 MED ORDER — ATORVASTATIN CALCIUM 20 MG PO TABS: 20.0000 mg | ORAL_TABLET | Freq: Every day | ORAL | 5 refills | Status: AC
